# Patient Record
Sex: Female | Born: 1985 | Race: White | Hispanic: No | Marital: Married | Smoking: Never smoker
Health system: Southern US, Community
[De-identification: ages and names within clinical notes are randomized; demographics above are authoritative.]

## PROBLEM LIST (undated history)

## (undated) DIAGNOSIS — J45909 Unspecified asthma, uncomplicated: Secondary | ICD-10-CM

## (undated) DIAGNOSIS — Z87898 Personal history of other specified conditions: Secondary | ICD-10-CM

## (undated) DIAGNOSIS — O24419 Gestational diabetes mellitus in pregnancy, unspecified control: Secondary | ICD-10-CM

## (undated) DIAGNOSIS — F32A Depression, unspecified: Secondary | ICD-10-CM

## (undated) DIAGNOSIS — Z8669 Personal history of other diseases of the nervous system and sense organs: Secondary | ICD-10-CM

## (undated) DIAGNOSIS — F329 Major depressive disorder, single episode, unspecified: Secondary | ICD-10-CM

## (undated) DIAGNOSIS — G43909 Migraine, unspecified, not intractable, without status migrainosus: Secondary | ICD-10-CM

## (undated) HISTORY — PX: NO PAST SURGERIES: SHX2092

## (undated) HISTORY — DX: Personal history of other diseases of the nervous system and sense organs: Z86.69

## (undated) HISTORY — DX: Depression, unspecified: F32.A

## (undated) HISTORY — PX: WISDOM TOOTH EXTRACTION: SHX21

## (undated) HISTORY — DX: Gestational diabetes mellitus in pregnancy, unspecified control: O24.419

## (undated) HISTORY — DX: Major depressive disorder, single episode, unspecified: F32.9

## (undated) HISTORY — DX: Personal history of other specified conditions: Z87.898

## (undated) HISTORY — DX: Migraine, unspecified, not intractable, without status migrainosus: G43.909

## (undated) HISTORY — DX: Unspecified asthma, uncomplicated: J45.909

---

## 2012-09-26 DIAGNOSIS — G43909 Migraine, unspecified, not intractable, without status migrainosus: Secondary | ICD-10-CM | POA: Insufficient documentation

## 2012-09-26 DIAGNOSIS — Q0701 Arnold-Chiari syndrome with spina bifida: Secondary | ICD-10-CM | POA: Insufficient documentation

## 2012-09-26 DIAGNOSIS — R55 Syncope and collapse: Secondary | ICD-10-CM | POA: Insufficient documentation

## 2015-07-28 ENCOUNTER — Ambulatory Visit
Admission: RE | Admit: 2015-07-28 | Discharge: 2015-07-28 | Disposition: A | Discharge status: Home / Self Care | Payer: BLUE CROSS/BLUE SHIELD | Source: Ambulatory Visit | Attending: Nurse Practitioner | Admitting: Nurse Practitioner

## 2015-07-28 ENCOUNTER — Other Ambulatory Visit: Payer: Self-pay | Admitting: Nurse Practitioner

## 2015-07-28 ENCOUNTER — Ambulatory Visit
Admission: RE | Admit: 2015-07-28 | Discharge: 2015-07-28 | Disposition: A | Discharge status: Home / Self Care | Payer: BLUE CROSS/BLUE SHIELD | Source: Ambulatory Visit | Attending: Preventative Medicine | Admitting: Preventative Medicine

## 2015-07-28 DIAGNOSIS — M25471 Effusion, right ankle: Secondary | ICD-10-CM

## 2015-07-28 DIAGNOSIS — M25473 Effusion, unspecified ankle: Secondary | ICD-10-CM | POA: Insufficient documentation

## 2015-08-19 DIAGNOSIS — G935 Compression of brain: Secondary | ICD-10-CM | POA: Diagnosis not present

## 2015-08-19 DIAGNOSIS — Z79899 Other long term (current) drug therapy: Secondary | ICD-10-CM | POA: Diagnosis not present

## 2015-08-19 DIAGNOSIS — Z6833 Body mass index (BMI) 33.0-33.9, adult: Secondary | ICD-10-CM | POA: Diagnosis not present

## 2015-08-19 DIAGNOSIS — R55 Syncope and collapse: Secondary | ICD-10-CM | POA: Diagnosis not present

## 2015-08-19 DIAGNOSIS — G43909 Migraine, unspecified, not intractable, without status migrainosus: Secondary | ICD-10-CM | POA: Diagnosis not present

## 2015-08-19 DIAGNOSIS — G43009 Migraine without aura, not intractable, without status migrainosus: Secondary | ICD-10-CM | POA: Diagnosis not present

## 2015-08-20 DIAGNOSIS — M25571 Pain in right ankle and joints of right foot: Secondary | ICD-10-CM | POA: Diagnosis not present

## 2015-08-27 DIAGNOSIS — M25571 Pain in right ankle and joints of right foot: Secondary | ICD-10-CM | POA: Diagnosis not present

## 2015-09-17 ENCOUNTER — Encounter: Payer: Self-pay | Admitting: Physical Therapy

## 2015-09-17 ENCOUNTER — Ambulatory Visit: Payer: BLUE CROSS/BLUE SHIELD | Attending: Nurse Practitioner | Admitting: Physical Therapy

## 2015-09-17 DIAGNOSIS — M25671 Stiffness of right ankle, not elsewhere classified: Secondary | ICD-10-CM | POA: Insufficient documentation

## 2015-09-17 DIAGNOSIS — M6281 Muscle weakness (generalized): Secondary | ICD-10-CM | POA: Insufficient documentation

## 2015-09-17 DIAGNOSIS — M25571 Pain in right ankle and joints of right foot: Secondary | ICD-10-CM

## 2015-09-17 NOTE — Therapy (Signed)
[facility] [address] [place], [address] Phone: [phone]   Fax:  [phone]  Physical Therapy Evaluation  Patient Details  Name: Angela Mccormick MRN: [id number] Date of Birth: [birth year] Referring Provider: Martie Round AGNP  Encounter Date: 09/17/2015      PT End of Session - 09/17/15 1223    Visit Number 1   Number of Visits 12   PT Start Time 0952   PT Stop Time 1045   PT Time Calculation (min) 53 min   Activity Tolerance Patient tolerated treatment well   Behavior During Therapy [facility] [place] for tasks assessed/performed      History reviewed. No pertinent past medical history.  History reviewed. No pertinent past surgical history.  There were no vitals filed for this visit.       Subjective Assessment - 09/17/15 1014    Subjective Pt states she's experiencing R lateral ankle pain, weakness and edema. Reports ankle instability resulting in a fall once each month. Pt states increase pain and difficulty with ascending/descending ladders, standing, ascending/descending stairs and ambulating. Decreased pain with medication(naproxin), sitting, and Ice. Worse at night before falling asleep. The best the pain gets 0/10 and the worst the pain gets is 6/10.   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   Currently in Pain? No/denies   Pain Score 0-No pain   Pain Location Ankle   Pain Orientation Right   Pain Descriptors / Indicators Aching;Stabbing   Pain Type Chronic pain            [facility] PT Assessment - 09/17/15 1008    Assessment   Medical Diagnosis Ankle Joint Pain, Right (ICD-719.47); Ankle Swelling  (ICD-729.81)   Referring Provider Martie Round AGNP   Onset Date/Surgical Date 07/14/15   Hand Dominance Right   Next MD Visit unknown   Prior Therapy ten years ago   Precautions   Precautions None   Restrictions   Weight Bearing Restrictions No   Balance Screen   Has the patient fallen in the past 6 months Yes   How many times? 1   Has the patient had a decrease in activity level because of a fear of falling?  No   Is the patient reluctant to leave their home because of a fear of falling?  No   Home [employer] information centre manager residence   Living Arrangements Spouse/significant other   Available Help at Discharge Family   Type of Home House   Home Access Ramped entrance   Home Layout One level   Prior Function   Level of Independence Independent   Vocation Part time employment   Vocation Requirements Walking, lifting, standing, ascending ladders   Leisure Ride 4 wheelers,    Cognition   Overall Cognitive Status Within Functional Limits for tasks assessed       Objective: Observation: Gait: Decreased push off on R with increase knee flexion, increased bilateral hip external rotation and increased ankle pronation. Increased pocket swelling distal to lateral malleolus on the right side.  Palpation: Increased muscle spasms and guarding along peroneal musculature. Tenderness to palpation along lateral ankle ligaments and plantar aspect of the foot.   Measurement:  R Ankle AROM/MMT: Dorsiflexion: 0, 4-/5,  Plantarflexion: 65, Inversion: 45, 4-/5, Eversion:10, 4-/5 R Hip AROM/MMT: Extension:WNL, 3+/5, ABD: WNL 4-/5,  ER:WNL 4-/5, L Hip AROM/MMT:  Extension:WNL; 4-/5, ABD:WNL 4-/5 ,ER:WNL 4-/5, Talocrural anterior-posterior bilaterally: WNL bilaterally  Single leg Balance: R: 7 secs, L: 30sec  Special Testing: R: (-): Talar tilt test  Outcome Measures:  Foot/Ankle disability Index: 22% LEFS: 59/80  Therapeutic Exercise: Patient performed exercises with  guidance, verbal and tactile cues and demonstration of therapist: Clamshells in sidelying B -- x15 Hip extension in prone-- x15 Foot intrinsics  (dorsiflexion/ toe flexion- plantarflexion/toe extension) -- x15 AROM ankle inversion/eversion with towel -- x15   Patient response to treatment: No aggravation of symptoms during or after treatment. Increased instability with single leg stance requiring UE support indicating decreased proprioception/motor control. Demonstrated good technique with exercises following instruction and with verbal cuing          PT Education - 09/17/15 1220    Education provided Yes   Education Details HEP: Ankle AROM (inversion/eversion), foot intrinsics, hip extension, clamshells    Person(s) Educated Patient   Methods Explanation;Demonstration   Comprehension Verbalized understanding;Returned demonstration             PT Long Term Goals - 09/17/15 1239    PT LONG TERM GOAL #1   Title Pt will demonstrate a score of 69/80 on her LEFS score by 10/29/15 to demonstrate significant improvement in self percieved lower extremity function and improved ability to perform occupational tasks such as walking.   Baseline LEFS: 59/80   Status New   PT LONG TERM GOAL #2   Title Pt will demonstrate a score of <12% on her foot/ankle disability index by 10/29/15 to demonstrate significant improvement in self percieved ankle function and improved ability to perform occupational tasks such as climbing ladders   Baseline Foot/ankle disability index: 22%   Status New   PT LONG TERM GOAL #3   Title Pt will be independent with exercise performance and progression aimed at improving ankle propioception, muscular endurance and motor control by 10/29/15 to continue improvement of symptoms after discharge from therapy   Baseline Dependent with exercise progression and performance requiring frequent cueing to perform.    Status New               Plan - 09/17/15 1231     Clinical Impression Statement Patient is a 30yo right hand dominant female experiencing R ankle instability and pain. Patient demonstrates increased ankle dysfunction demonstrated by her decreased scores on the  Foot/ankle disability index 22% (moderate self-perceived ankle dysfunction) and LEFS: 59/80 (Moderate lower extremity self-perceived function). Patient reports increased difficulty with occupational tasks such as ascending/descending ladders, and ambulating, Patient also demonstrates decreased single leg balance and ankle/hip strength indicating impaired lower extremity motor control and muscular endurance/strength and patinet will benefit from further skilled therapy to return to prior level of function.     Rehab Potential Good   Clinical Impairments Affecting Rehab Potential (-) Arnold-chiari malformation, job duties (+) Age, family support.   PT Frequency 2x / week   PT Duration 6 weeks   PT Treatment/Interventions Iontophoresis 4mg /ml Dexamethasone;Moist Heat;Therapeutic exercise;Therapeutic activities;Manual techniques;Electrical Stimulation;Cryotherapy;Patient/family education;Neuromuscular re-education;Ultrasound;Aquatic Therapy;Balance training   PT Next Visit Plan  heel raises, bridges, ankle strengthening with bands.   PT Home Exercise Plan  foot instrinsics strengthening, clamshells, hip extension, AROM ankle inversion/eversion   Consulted and Agree with Plan of Care Patient      Patient will benefit from skilled therapeutic intervention in order to  improve the following deficits and impairments:  Decreased balance, Decreased mobility, Decreased strength, Increased edema, Hypermobility, Pain, Decreased endurance, Decreased coordination, Decreased range of motion  Visit Diagnosis: Pain in right ankle and joints of right foot  Stiffness of right ankle, not elsewhere classified  Muscle weakness (generalized)     Problem List There are no active problems to display for this  patient.   Myrene GalasWesley Kiaja Shorty, SPT 09/17/2015, 12:45 PM  [facility] [address] [place], [place], [address] Phone: [phone]   Fax:  [phone]  Name: Cannon KettleBrittany M Brod MRN: [id number] Date of Birth: 08-19-1985

## 2015-09-20 ENCOUNTER — Encounter: Payer: Self-pay | Admitting: Physical Therapy

## 2015-09-20 ENCOUNTER — Ambulatory Visit: Payer: BLUE CROSS/BLUE SHIELD | Admitting: Physical Therapy

## 2015-09-20 DIAGNOSIS — M25571 Pain in right ankle and joints of right foot: Secondary | ICD-10-CM | POA: Diagnosis not present

## 2015-09-20 DIAGNOSIS — M6281 Muscle weakness (generalized): Secondary | ICD-10-CM

## 2015-09-20 DIAGNOSIS — M25671 Stiffness of right ankle, not elsewhere classified: Secondary | ICD-10-CM

## 2015-09-20 NOTE — Therapy (Signed)
[facility] [address], [place], [address] Phone: [phone]   Fax:  [phone]  Physical Therapy Treatment  Patient Details  Name: Angela Mccormick MRN: [id number] Date of Birth: 12/19/1985 Referring Provider: Martie Round AGNP  Encounter Date: 09/20/2015      PT End of Session - 09/20/15 1913    Visit Number 2   Number of Visits 12   Date for PT Re-Evaluation 10/29/15   PT Start Time 1530   PT Stop Time 1614   PT Time Calculation (min) 44 min   Activity Tolerance Patient tolerated treatment well   Behavior During Therapy [facility] for tasks assessed/performed      History reviewed. No pertinent past medical history.  History reviewed. No pertinent past surgical history.  There were no vitals filed for this visit.      Subjective Assessment - 09/20/15 1535    Subjective Pt states no increased pain today however mentions she had one instance of the ankle "popping" while standing at work yesterday which caused mild pain in the ankle.    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   Currently in Pain? No/denies       Objective: Observation: Gait: Decreased push off on R with increase knee flexion, increased bilateral hip external rotation and increased ankle pronation. Increased pocket swelling distal to lateral malleolus on the right side. Foot fasiculations noted throughout entirety of treatment session.  Palpation: Increased muscle spasms and guarding along peroneal musculature. Tenderness to palpation along lateral ankle ligaments and plantar aspect of the foot -- decreased tenderness today   Therapeutic Exercise: Patient performed exercises  with guidance, verbal and tactile cues and demonstration of therapist:  Foot intrinsics (dorsiflexion/ toe flexion- plantarflexion/toe extension) -- x15 Resisted dorsiflexion with red band -- x 20 Resisted Eversion/Inversion with red band -- x 20 Side stepping onto Airex band -- x 20 Tandem stepping on airex beam -- x 10 (up/down) SLS on airex pad -- 3 x 10 sec with UE support BAPS board -- forward/back, side/side (2x15)  Manual Therapy: Soft tissue mobilization to superior/distal aspect of the peroneal muscular and the plantar aspect of the foot with the patient positioned in long sitting to decrease muscle spasms.    Patient response to treatment: No aggravation of symptoms during or after treatment. Increased instability with single leg stance requiring UE support indicating decreased proprioception/motor control. Demonstrated good technique with sitting band exercises today requiring tactile cueing to perform with decreased hip movement.           PT Education - 09/20/15 0912    Education provided Yes   Education Details HEP: Ankle inversion/eversion/ dorsiflexion with the band.   Person(s) Educated Patient   Methods Explanation;Demonstration   Comprehension Verbalized understanding;Returned demonstration             PT Long Term Goals - 09/17/15 1239    PT LONG TERM GOAL #1   Title Pt will demonstrate a score of 69/80 on her LEFS score by 10/29/15 to demonstrate significant improvement in self percieved lower extremity function and improved ability to perform occupational tasks such as walking.   Baseline LEFS: 59/80   Status New   PT LONG TERM GOAL #2   Title  Pt will demonstrate a score of <12% on her foot/ankle disability index by 10/29/15 to demonstrate significant improvement in self percieved ankle function and improved ability to perform occupational tasks such as climbing ladders   Baseline Foot/ankle disability index: 22%   Status New   PT LONG TERM GOAL #3    Title Pt will be independent with exercise performance and progression aimed at improving ankle propioception, muscular endurance and motor control by 10/29/15 to continue improvement of symptoms after discharge from therapy   Baseline Dependent with exercise progression and performance requiring frequent cueing to perform.    Status New               Plan - 09/20/15 1922    Clinical Impression Statement Patient making progress towards long term goals with ability to tolerate increase in exercise progression without aggravation symptoms. Patient demonstrates increased ankle strategies when performing standing balance exercise indicating decreased propioception and will benefit from further skilled therapy to return to prior level of function.    Rehab Potential Good   Clinical Impairments Affecting Rehab Potential (-) Arnold-chiari malformation, job duties (+) Age, family support.   PT Frequency 2x / week   PT Duration 6 weeks   PT Treatment/Interventions Iontophoresis 4mg /ml Dexamethasone;Moist Heat;Therapeutic exercise;Therapeutic activities;Manual techniques;Electrical Stimulation;Cryotherapy;Patient/family education;Neuromuscular re-education;Ultrasound;Aquatic Therapy;Balance training   PT Next Visit Plan  heel raises, bridges, ankle strengthening with bands.   PT Home Exercise Plan  foot instrinsics strengthening, clamshells, hip extension, AROM ankle inversion/eversion   Consulted and Agree with Plan of Care Patient      Patient will benefit from skilled therapeutic intervention in order to improve the following deficits and impairments:  Decreased balance, Decreased mobility, Decreased strength, Increased edema, Hypermobility, Pain, Decreased endurance, Decreased coordination, Decreased range of motion  Visit Diagnosis: Pain in right ankle and joints of right foot  Muscle weakness (generalized)  Stiffness of right ankle, not elsewhere classified     Problem List There  are no active problems to display for this patient.   Myrene GalasWesley Abimelec Grochowski, SPT 09/21/2015, 9:25 AM  [facility] [address] [place], [place], [address] Phone: [phone]   Fax:  [phone]  Name: Angela Mccormick MRN: [id number] Date of Birth: [birth year]

## 2015-09-23 ENCOUNTER — Encounter: Payer: BLUE CROSS/BLUE SHIELD | Admitting: Physical Therapy

## 2015-09-24 ENCOUNTER — Encounter: Payer: Self-pay | Admitting: Physical Therapy

## 2015-09-24 ENCOUNTER — Ambulatory Visit: Payer: BLUE CROSS/BLUE SHIELD | Admitting: Physical Therapy

## 2015-09-24 DIAGNOSIS — M25671 Stiffness of right ankle, not elsewhere classified: Secondary | ICD-10-CM | POA: Diagnosis not present

## 2015-09-24 DIAGNOSIS — M25571 Pain in right ankle and joints of right foot: Secondary | ICD-10-CM | POA: Diagnosis not present

## 2015-09-24 DIAGNOSIS — M6281 Muscle weakness (generalized): Secondary | ICD-10-CM

## 2015-09-24 NOTE — Therapy (Signed)
[facility] [address] [place], [place], [address] Phone: [phone]   Fax:  [phone]  Physical Therapy Treatment  Patient Details  Name: Angela Mccormick MRN: [id number] Date of Birth: [birth year] Referring Provider: Martie RoundNicole Spencer AGNP  Encounter Date: 09/24/2015      PT End of Session - 09/24/15 1001    Visit Number 3   Number of Visits 12   Date for PT Re-Evaluation 10/29/15   PT Start Time 0908   PT Stop Time 0950   PT Time Calculation (min) 42 min   Activity Tolerance Patient tolerated treatment well   Behavior During Therapy [facility] for tasks assessed/performed      History reviewed. No pertinent past medical history.  History reviewed. No pertinent past surgical history.  There were no vitals filed for this visit.      Subjective Assessment - 09/24/15 0911    Subjective Pt states no episodes of ankle instability over the past few days. Experienced one episode of increased ankle pain since the previous visit after work.    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   Currently in Pain? No/denies      Objective: Observation: Gait: Decreased push off on R with increase knee flexion and increased ankle pronation. Decreased pocket swelling over distal to lateral malleolus on the right side--improved today. Foot fasiculations noted throughout entirety of treatment session.   Palpation: Increased muscle spasms and guarding along peroneal musculature (most noteably over distal tendon attachment today).  Therapeutic Exercise: Patient performed exercises with guidance, verbal and tactile cues and demonstration of therapist:  Resisted dorsiflexion with  red band -- x 20 Resisted Eversion/Inversion with red band -- x 20 Marching on airex - x10 Single leg tance ball toss at pitch back (with one leg supported on dynadisc) --  x 15 bilaterally Monster walk side stepping with green band around knees -- 7130ft x 2 both directions Tandem stepping on airex beam -- x 10 (up/down with airex pad placed underneath) SLS on airex pad -- 3 x 10 sec with UE support BAPS board -- forward/back, side/side, rotations clockwise --x15 Stepping up onto airex pad with high knee -- x 15  Manual Therapy: Soft tissue mobilization to superior/distal aspect of the peroneal muscular and tibialis anterior  with the patient positioned in long sitting to decrease muscle spasms.    Patient response to treatment: No aggravation of symptoms during or after treatment. Increased ankle strategies during dynamic balance exercises requiring UE support to perform indicating decreased proprioception/motor control. Demonstrated good technique with monster walk exercises requiring minimal verbal cueing for proper muscle activation.          PT Education - 09/24/15 1001    Education provided Yes   Education Details HEP: Step ups with with high knee    Person(s) Educated Patient   Methods Demonstration;Explanation   Comprehension Verbalized understanding;Returned demonstration             PT Long Term Goals - 09/17/15 1239    PT LONG TERM GOAL #1   Title Pt will demonstrate a score of 69/80 on her LEFS score by 10/29/15 to demonstrate significant improvement in self percieved lower extremity function and improved ability to perform occupational tasks such  as walking.   Baseline LEFS: 59/80   Status New   PT LONG TERM GOAL #2   Title Pt will demonstrate a score of <12% on her foot/ankle disability index by 10/29/15 to demonstrate significant improvement in self percieved ankle function and improved ability to perform occupational tasks such as climbing ladders   Baseline  Foot/ankle disability index: 22%   Status New   PT LONG TERM GOAL #3   Title Pt will be independent with exercise performance and progression aimed at improving ankle propioception, muscular endurance and motor control by 10/29/15 to continue improvement of symptoms after discharge from therapy   Baseline Dependent with exercise progression and performance requiring frequent cueing to perform.    Status New               Plan - 09/24/15 1030    Clinical Impression Statement Patient making progress towards long term goals and tolerated increase in exercise progression without aggravation of symptoms. No episodes of ankle instability since previous visit indicates functional carryover between visits. Patient required less UE support to perform static balance exercise today compared to previous visits and will benefit from further skilled therapy to return to prior level of function.    Rehab Potential Good   Clinical Impairments Affecting Rehab Potential (-) Arnold-chiari malformation, job duties (+) Age, family support.   PT Frequency 2x / week   PT Duration 6 weeks   PT Treatment/Interventions Iontophoresis /ml Dexamethasone;Moist Heat;Therapeutic exercise;Therapeutic activities;Manual techniques;Electrical Stimulation;Cryotherapy;Patient/family education;Neuromuscular re-education;Ultrasound;Aquatic Therapy;Balance training   PT Next Visit Plan Progress standing ankle stabilization exercises   PT Home Exercise Plan  foot instrinsics strengthening, clamshells, hip extension, AROM ankle inversion/eversion   Consulted and Agree with Plan of Care Patient      Patient will benefit from skilled therapeutic intervention in order to improve the following deficits and impairments:  Decreased balance, Decreased mobility, Decreased strength, Increased edema, Hypermobility, Pain, Decreased endurance, Decreased coordination, Decreased range of motion  Visit Diagnosis: Pain in right ankle and  joints of right foot  Muscle weakness (generalized)  Stiffness of right ankle, not elsewhere classified     Problem List There are no active problems to display for this patient.   Myrene Galas, SPT 09/24/2015, 12:30 PM  [facility] PHYSICAL AND SPORTS MEDICINE [address] [place], [address] Phone: [phone]   Fax:  [phone]  Name: Angela Mccormick MRN: [id number] Date of Birth: 07-05-1985

## 2015-09-28 ENCOUNTER — Ambulatory Visit: Payer: BLUE CROSS/BLUE SHIELD | Admitting: Physical Therapy

## 2015-09-30 ENCOUNTER — Encounter: Payer: BLUE CROSS/BLUE SHIELD | Admitting: Physical Therapy

## 2015-10-01 ENCOUNTER — Ambulatory Visit: Payer: BLUE CROSS/BLUE SHIELD | Admitting: Physical Therapy

## 2015-10-01 ENCOUNTER — Encounter: Payer: Self-pay | Admitting: Physical Therapy

## 2015-10-01 DIAGNOSIS — M25571 Pain in right ankle and joints of right foot: Secondary | ICD-10-CM

## 2015-10-01 DIAGNOSIS — M6281 Muscle weakness (generalized): Secondary | ICD-10-CM | POA: Diagnosis not present

## 2015-10-01 DIAGNOSIS — M25671 Stiffness of right ankle, not elsewhere classified: Secondary | ICD-10-CM | POA: Diagnosis not present

## 2015-10-01 NOTE — Therapy (Signed)
[place] [facility] PH[facility]ICINE [address] [place], [place], [address] Phone: [phone]   Fax:  [phone]  Physical Therapy Treatment  Patient Details  Name: Angela KettleBrittany M Archila MRN: [id number] Date of Birth: 07-Jun-1985 Referring Provider: Martie RoundNicole Spencer AGNP  Encounter Date: 10/01/2015      PT End of Session - 10/01/15 1125    Visit Number 4   Number of Visits 12   Date for PT Re-Evaluation 10/29/15   PT Start Time 1031   PT Stop Time 1115   PT Time Calculation (min) 44 min   Activity Tolerance Patient tolerated treatment well   Behavior During Therapy [facility] for tasks assessed/performed      History reviewed. No pertinent past medical history.  History reviewed. No pertinent past surgical history.  There were no vitals filed for this visit.      Subjective Assessment - 10/01/15 1033    Subjective Pt states no episodes of ankle instability over the past week. States no current pain in the ankle.    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   Currently in Pain? No/denies      Objective: Observation: Gait: Minor decrease in push off on R and increased ankle pronation -- improved today.  Foot fasiculations noted throughout entire treatment.  Palpation: Increased muscle spasms and guarding along peroneal musculature (most noteably over distal tendon attachment today).  Therapeutic Exercise: Patient performed exercises with guidance, verbal and tactile cues and demonstration of therapist:  Resisted dorsiflexion with green band -- x 20 Resisted Eversion/Inversion with green band -- x 20 BAPS board -- forward/back, side/side, rotations clockwise --x15 lvl:3  (sitting) Single leg stance ball toss at pitch back (with one leg supported on dynadisc) -- x 15 bilaterally Single leg forward/backward, laterally on wobbleboard -- x15 bilaterally  Marching on airex - x10 Tandem stepping on airex beam -- x 10 (up/down with airex pad placed underneath) SLS on airex pad -- 3 x 15 sec with UE support Lateral walk outs against grey band -- x6 bilaterally   Patient response to treatment: No aggravation of symptoms during or after treatment. Increased ankle strategies when performing single leg stance on airex pad and required UE support to perform.           PT Education - 10/01/15 1126    Education provided Yes   Education Details HEP: Marches on airex pad, Single leg stance on airex   Person(s) Educated Patient   Methods Explanation;Demonstration   Comprehension Verbalized understanding;Returned demonstration             PT Long Term Goals - 09/17/15 1239    PT LONG TERM GOAL #1   Title Pt will demonstrate a score of 69/80 on her LEFS score by 10/29/15 to demonstrate significant improvement in self percieved lower extremity function and improved ability to perform occupational tasks such as walking.   Baseline LEFS: 59/80   Status New   PT LONG TERM GOAL #2   Title Pt will demonstrate a score of <12% on her foot/ankle disability index by 10/29/15 to demonstrate significant improvement in self percieved ankle function and improved ability to perform occupational tasks such as climbing ladders   Baseline Foot/ankle disability index: 22%   Status New  PT LONG TERM GOAL #3   Title Pt will be independent with exercise performance and progression aimed at improving ankle propioception, muscular endurance and motor control by 10/29/15 to continue improvement of symptoms after discharge from therapy   Baseline Dependent with exercise progression and performance requiring frequent cueing to perform.    Status New               Plan -  10/01/15 1122    Clinical Impression Statement Improved demonstration of dynamic/static balance with exercises indicating functional carryover between visits. Increased symptoms with single leg posterior weight shifts on the wobble board and required cueing to perform throughout decreased ROM to decrease pain indicating decreased muscular coordination. Patient will benefit from further skilled therapy aimed at improving muscular coordination/endurance to imporve functional occupational tasks.   Rehab Potential Good   Clinical Impairments Affecting Rehab Potential (-) Arnold-chiari malformation, job duties (+) Age, family support.   PT Frequency 2x / week   PT Duration 6 weeks   PT Treatment/Interventions Iontophoresis /ml Dexamethasone;Moist Heat;Therapeutic exercise;Therapeutic activities;Manual techniques;Electrical Stimulation;Cryotherapy;Patient/family education;Neuromuscular re-education;Ultrasound;Aquatic Therapy;Balance training   PT Next Visit Plan Progress standing ankle stabilization exercises   PT Home Exercise Plan  foot instrinsics strengthening, clamshells, hip extension, AROM ankle inversion/eversion   Consulted and Agree with Plan of Care Patient      Patient will benefit from skilled therapeutic intervention in order to improve the following deficits and impairments:  Decreased balance, Decreased mobility, Decreased strength, Increased edema, Hypermobility, Pain, Decreased endurance, Decreased coordination, Decreased range of motion  Visit Diagnosis: Pain in right ankle and joints of right foot  Muscle weakness (generalized)  Stiffness of right ankle, not elsewhere classified     Problem List There are no active problems to display for this patient.   Myrene Galas, SPT 10/01/2015, 11:29 AM  [place] [facility] PHYSICAL AND SPORTS MEDICINE [address], [place], [address] Phone: [phone]   Fax:  [phone]  Name:  PRANATHI WINFREE MRN: [id number] Date of Birth: 14-Feb-1986

## 2015-10-05 ENCOUNTER — Ambulatory Visit: Payer: BLUE CROSS/BLUE SHIELD | Admitting: Physical Therapy

## 2015-10-05 ENCOUNTER — Encounter: Payer: Self-pay | Admitting: Physical Therapy

## 2015-10-05 DIAGNOSIS — M25571 Pain in right ankle and joints of right foot: Secondary | ICD-10-CM

## 2015-10-05 DIAGNOSIS — M6281 Muscle weakness (generalized): Secondary | ICD-10-CM | POA: Diagnosis not present

## 2015-10-05 DIAGNOSIS — M25671 Stiffness of right ankle, not elsewhere classified: Secondary | ICD-10-CM | POA: Diagnosis not present

## 2015-10-05 NOTE — Therapy (Signed)
[place] [facility] [address] , [place], [address] Phone: [phone]   Fax:  [phone]  Physical Therapy Treatment  Patient Details  Name: Angela Mccormick MRN: [id number] Date of Birth: [birth year] Referring Provider: Martie RoundNicole Spencer AGNP  Encounter Date: 10/05/2015      PT End of Session - 10/05/15 1706    Visit Number 5   Number of Visits 12   Date for PT Re-Evaluation 10/29/15   PT Start Time 1616   PT Stop Time 1657   PT Time Calculation (min) 41 min   Activity Tolerance Patient tolerated treatment well   Behavior During Therapy [facility] for tasks assessed/performed      History reviewed. No pertinent past medical history.  History reviewed. No pertinent past surgical history.  There were no vitals filed for this visit.      Subjective Assessment - 10/05/15 1617    Subjective Pt states the ankle has been feeling better stating no episodes of instability since the previous appointment. Reports minimal soreness over the ankle on Saturday which resolved the following day.    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   Currently in Pain? No/denies      Objective: Observation: Gait: Improved push off on R foot. Foot fasiculations noted throughout entire treatment.  Palpation: Increased muscle spasms and guarding along peroneal musculature (most noteably over distal tendon attachment).  Therapeutic Exercise: Patient performed exercises with guidance, verbal and tactile cues and demonstration of therapist:  Resisted dorsiflexion with green band -- x 20 Resisted Eversion/Inversion with green band -- x 20 BAPS board -- forward/back, side/side,  rotations clockwise --x20 lvl:3 (sitting) Single leg stance ball toss at pitch back (with one leg supported on dynadisc) -- x 15 bilaterally Ball kicks on airex pad -- x15 Tandem rotations on airex pad -- x10  Forward/backward, laterally on wobbleboard -- x15 bilaterally  Marching on Bosu- x10 SLS on airex pad -- 5 x 10 sec with UE support (stopped due to increased fatigue)   Patient response to treatment: Slight increase in symptoms after performing single leg stance on the airex pad which is improved after 20 seconds of standing rest break. Good demonstration of soccer kicks requiring UE support to perform with proper technique and muscular activation.           PT Education - 10/05/15 1706    Education provided Yes   Education Details HEP: soccer kicks on the airex pad   Person(s) Educated Patient   Methods Explanation;Demonstration   Comprehension Verbalized understanding;Returned demonstration             PT Long Term Goals - 09/17/15 1239    PT LONG TERM GOAL #1   Title Pt will demonstrate a score of 69/80 on her LEFS score by 10/29/15 to demonstrate significant improvement in self percieved lower extremity function and improved ability to perform occupational tasks such as walking.   Baseline LEFS: 59/80   Status New   PT LONG TERM GOAL #2   Title Pt will demonstrate a score of <12% on her foot/ankle disability index by 10/29/15 to demonstrate significant improvement in self percieved ankle function and improved ability to perform occupational tasks such as climbing ladders   Baseline Foot/ankle disability  index: 22%   Status New   PT LONG TERM GOAL #3   Title Pt will be independent with exercise performance and progression aimed at improving ankle propioception, muscular endurance and motor control by 10/29/15 to continue improvement of symptoms after discharge from therapy   Baseline Dependent with exercise progression and performance requiring frequent cueing to  perform.    Status New               Plan - 10/05/15 1707    Clinical Impression Statement Improved ability to perform BAPS board exercises today indicating improvement in muscular coordination. Increased symptoms and ankle strategies when performing single leg balance at the airex pad indicating decreased muscular endurance/proprioception and patient will benefit from further skilled therapy to return to prior level of function.    Rehab Potential Good   Clinical Impairments Affecting Rehab Potential (-) Arnold-chiari malformation, job duties (+) Age, family support.   PT Frequency 2x / week   PT Duration 6 weeks   PT Treatment/Interventions Iontophoresis /ml Dexamethasone;Moist Heat;Therapeutic exercise;Therapeutic activities;Manual techniques;Electrical Stimulation;Cryotherapy;Patient/family education;Neuromuscular re-education;Ultrasound;Aquatic Therapy;Balance training   PT Next Visit Plan Progress standing ankle stabilization exercises   PT Home Exercise Plan  foot instrinsics strengthening, clamshells, hip extension, AROM ankle inversion/eversion   Consulted and Agree with Plan of Care Patient      Patient will benefit from skilled therapeutic intervention in order to improve the following deficits and impairments:  Decreased balance, Decreased mobility, Decreased strength, Increased edema, Hypermobility, Pain, Decreased endurance, Decreased coordination, Decreased range of motion  Visit Diagnosis: Pain in right ankle and joints of right foot  Muscle weakness (generalized)  Stiffness of right ankle, not elsewhere classified     Problem List There are no active problems to display for this patient.   Myrene Galas, SPT 10/05/2015, 5:30 PM  [place] [facility] PHYSICAL AND SPORTS MEDICINE [address], [place], [address] Phone: [phone]   Fax:  [phone]  Name: Angela Mccormick MRN: [id number] Date of Birth:  01-12-1986

## 2015-10-07 ENCOUNTER — Encounter: Payer: BLUE CROSS/BLUE SHIELD | Admitting: Physical Therapy

## 2015-10-08 ENCOUNTER — Encounter: Payer: Self-pay | Admitting: Physical Therapy

## 2015-10-08 ENCOUNTER — Ambulatory Visit: Payer: BLUE CROSS/BLUE SHIELD | Admitting: Physical Therapy

## 2015-10-08 DIAGNOSIS — M6281 Muscle weakness (generalized): Secondary | ICD-10-CM

## 2015-10-08 DIAGNOSIS — M25571 Pain in right ankle and joints of right foot: Secondary | ICD-10-CM | POA: Diagnosis not present

## 2015-10-08 DIAGNOSIS — M25671 Stiffness of right ankle, not elsewhere classified: Secondary | ICD-10-CM | POA: Diagnosis not present

## 2015-10-08 NOTE — Therapy (Signed)
[place] [facility]ORTS MEDICINE [address], [place], [address] Phone: [phone]   Fax:  [phone]  Physical Therapy Treatment  Patient Details  Name: Angela Mccormick MRN: [id number] Date of Birth: June 25, 1985 Referring Provider: Martie Round AGNP  Encounter Date: 10/08/2015      PT End of Session - 10/08/15 1044    Visit Number 6   Number of Visits 12   Date for PT Re-Evaluation 10/29/15   PT Start Time 0855   PT Stop Time 0925   PT Time Calculation (min) 30 min   Activity Tolerance Patient tolerated treatment well   Behavior During Therapy [facility] for tasks assessed/performed      History reviewed. No pertinent past medical history.  History reviewed. No pertinent past surgical history.  There were no vitals filed for this visit.      Subjective Assessment - 10/08/15 0917    Subjective Patient states her ankle is feeling much beter and states no instance of instability since the previous visit.   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   Currently in Pain? No/denies         Objective: Observation: Gait: Improved push off on R foot. Foot fasiculations noted throughout entire treatment.  Palpation: Increased muscle spasms and guarding along peroneal musculature (most noteably over distal tendon attachment).  Therapeutic Exercise: Patient performed exercises with guidance, verbal and tactile cues and demonstration of therapist:   BAPS board -- forward/back, side/side, rotations clockwise --x20 lvl:3 (sitting) Single leg stance ball toss at pitch back (with one leg supported on dynadisc) -- x 15 bilaterally Ball kicks on airex pad -- x15  bilaterally Tandem rotations with 4# ball  -- x15(with arms extended into shoulder flexion@ 90 degrees) Heel/toe raises on airex pad -- x 15 Monster walks with green resistance band -- 20 x 2 both directions.  SLS on airex pad -- 3 x 20 sec with UE support (stopped due to increased fatigue) Heel taps off of 4" step -- x 10 bilaterally   Patient response to treatment: No aggravation of symptoms noted during or after treatment session. Good demonstration of heel/toe raises on airex pad with patient requiring minimal cueing to perform exercise with proper lumbar/hip positioning.          PT Education - 10/08/15 1045    Education provided Yes   Education Details HEP: monster walks with green band   Person(s) Educated Patient   Methods Explanation;Demonstration   Comprehension Verbalized understanding;Returned demonstration             PT Long Term Goals - 09/17/15 1239    PT LONG TERM GOAL #1   Title Pt will demonstrate a score of 69/80 on her LEFS score by 10/29/15 to demonstrate significant improvement in self percieved lower extremity function and improved ability to perform occupational tasks such as walking.   Baseline LEFS: 59/80   Status New   PT LONG TERM GOAL #2   Title Pt will demonstrate a score of <12% on her foot/ankle disability index by 10/29/15 to demonstrate significant improvement in self percieved ankle function and improved ability to perform occupational tasks such as climbing ladders   Baseline Foot/ankle disability index: 22%   Status New   PT  LONG TERM GOAL #3   Title Pt will be independent with exercise performance and progression aimed at improving ankle propioception, muscular endurance and motor control by 10/29/15 to continue improvement of symptoms after discharge from therapy   Baseline Dependent with exercise progression and performance requiring frequent cueing to perform.    Status New               Plan - 10/08/15 1045    Clinical  Impression Statement Good functional carryover between visits with patient demonstrating no episodes of pain or stability since the previous session. Continued to focus on improving hip and ankle stability to improve muscular coordination and endurance in the ankle/LE musculature. Patient will benefit from further skilled therapy to address limitations of weakness and decreased endurance in order to return to prior level of function.    Rehab Potential Good   Clinical Impairments Affecting Rehab Potential (-) Arnold-chiari malformation, job duties (+) Age, family support.   PT Frequency 2x / week   PT Duration 6 weeks   PT Treatment/Interventions Iontophoresis 4mg /ml Dexamethasone;Moist Heat;Therapeutic exercise;Therapeutic activities;Manual techniques;Electrical Stimulation;Cryotherapy;Patient/family education;Neuromuscular re-education;Ultrasound;Aquatic Therapy;Balance training   PT Next Visit Plan Progress standing ankle stabilization exercises   PT Home Exercise Plan  foot instrinsics strengthening, clamshells, hip extension, AROM ankle inversion/eversion   Consulted and Agree with Plan of Care Patient      Patient will benefit from skilled therapeutic intervention in order to improve the following deficits and impairments:  Decreased balance, Decreased mobility, Decreased strength, Increased edema, Hypermobility, Pain, Decreased endurance, Decreased coordination, Decreased range of motion  Visit Diagnosis: Pain in right ankle and joints of right foot  Muscle weakness (generalized)  Stiffness of right ankle, not elsewhere classified     Problem List There are no active problems to display for this patient.   Myrene GalasWesley Nixxon Faria, SPT 10/08/2015, 10:49 AM  [place] [facility] [address] [place], [place], [address] Phone: [phone]   Fax:  [phone]  Name: Angela Mccormick MRN: [id number] Date of Birth: Jul 30, 1985

## 2015-10-13 ENCOUNTER — Encounter: Payer: Self-pay | Admitting: Physical Therapy

## 2015-10-13 ENCOUNTER — Ambulatory Visit: Payer: BLUE CROSS/BLUE SHIELD | Admitting: Physical Therapy

## 2015-10-13 DIAGNOSIS — M25671 Stiffness of right ankle, not elsewhere classified: Secondary | ICD-10-CM | POA: Diagnosis not present

## 2015-10-13 DIAGNOSIS — M25571 Pain in right ankle and joints of right foot: Secondary | ICD-10-CM | POA: Diagnosis not present

## 2015-10-13 DIAGNOSIS — M6281 Muscle weakness (generalized): Secondary | ICD-10-CM

## 2015-10-13 NOTE — Therapy (Signed)
[place] [facility] PHYSICAL [facility] SPORTS MEDICINE [address], [place], [address] Phone: [phone]   Fax:  [phone]  Physical Therapy Treatment  Patient Details  Name: Angela Mccormick MRN: [id number] Date of Birth: April 05, 1986 Referring Provider: Martie Round AGNP  Encounter Date: 10/13/2015      PT End of Session - 10/13/15 1630    Visit Number 7   Number of Visits 12   Date for PT Re-Evaluation 10/29/15   PT Start Time 1616   PT Stop Time 1649   PT Time Calculation (min) 33 min   Activity Tolerance Patient tolerated treatment well   Behavior During Therapy [facility] for tasks assessed/performed      History reviewed. No pertinent past medical history.  History reviewed. No pertinent past surgical history.  There were no vitals filed for this visit.      Subjective Assessment - 10/13/15 1618    Subjective Patient states her ankle has been feeling good with no instances of instability since the previous visits. Overall patient states she's about 60% better since begining PT.   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   Currently in Pain? No/denies      Objective: Observation: Gait: Improved push off on R foot. Foot fasiculations noted throughout entire treatment.   Therapeutic Exercise: Patient performed exercises with guidance, verbal and tactile cues and demonstration of therapist:  Heel/toe raises on bosu -- x 15 Single leg stance ball toss at pitch back (with one leg supported on dynadisc) -- x 15 bilaterally, x5 B SLS without dynadisc Hip machine ABD -- 40# x20, EXT 80# x20 (performed Bilaterally) SLS on Bosu -- 3 x 20 sec with UE support  Running man in standing  (with foot behind) -- x15  Tandem rotations on Airex  -- x10 Monster walks with green resistance band -- x35 ft bilaterally    Patient response to treatment: No aggravation of symptoms noted during or after treatment session. Good demonstration of tandem stance rotations requiring minimal cueing for proper LE position to allow for proper joint alignment and technique.          PT Education - 10/13/15 1625    Education provided Yes   Education Details HEP: hip machine and running man exercise    Person(s) Educated Patient   Methods Explanation;Demonstration   Comprehension Verbalized understanding;Returned demonstration             PT Long Term Goals - 09/17/15 1239    PT LONG TERM GOAL #1   Title Pt will demonstrate a score of 69/80 on her LEFS score by 10/29/15 to demonstrate significant improvement in self percieved lower extremity function and improved ability to perform occupational tasks such as walking.   Baseline LEFS: 59/80   Status New   PT LONG TERM GOAL #2   Title Pt will demonstrate a score of <12% on her foot/ankle disability index by 10/29/15 to demonstrate significant improvement in self percieved ankle function and improved ability to perform occupational tasks such as climbing ladders   Baseline Foot/ankle disability index: 22%   Status New   PT LONG TERM GOAL #3   Title Pt will be independent with exercise performance and progression aimed at improving ankle propioception, muscular endurance and motor  control by 10/29/15 to continue improvement of symptoms after discharge from therapy   Baseline Dependent with exercise progression and performance requiring frequent cueing to perform.    Status New               Plan - 10/13/15 1745    Clinical Impression Statement Patient making progress towards long term goals demonstrating decreased foot fasiculations and required UE support with exercises indicating functional carryover between visits. Although  patient is improving, she continues to demonstrate early onset of fatigue with exercises and requires cueing for proper joint position indicating decreased muscular endurance and coordination and pt will benefit from further skilled therapy to return to prior level of function.     Rehab Potential Good   Clinical Impairments Affecting Rehab Potential (-) Arnold-chiari malformation, job duties (+) Age, family support.   PT Frequency 2x / week   PT Duration 6 weeks   PT Treatment/Interventions Iontophoresis 4mg /ml Dexamethasone;Moist Heat;Therapeutic exercise;Therapeutic activities;Manual techniques;Electrical Stimulation;Cryotherapy;Patient/family education;Neuromuscular re-education;Ultrasound;Aquatic Therapy;Balance training   PT Next Visit Plan Progress standing ankle stabilization exercises   PT Home Exercise Plan  foot instrinsics strengthening, clamshells, hip extension, AROM ankle inversion/eversion   Consulted and Agree with Plan of Care Patient      Patient will benefit from skilled therapeutic intervention in order to improve the following deficits and impairments:  Decreased balance, Decreased mobility, Decreased strength, Increased edema, Hypermobility, Pain, Decreased endurance, Decreased coordination, Decreased range of motion  Visit Diagnosis: Pain in right ankle and joints of right foot  Muscle weakness (generalized)  Stiffness of right ankle, not elsewhere classified     Problem List There are no active problems to display for this patient.   Angela Mccormick, SPT 10/13/2015, 5:48 PM  [place] [facility] [address] [place], [place], [address] Phone: [phone]   Fax:  [phone]  Name: Angela Mccormick MRN: [id number] Date of Birth: Jan 26, 1986

## 2015-10-14 DIAGNOSIS — N926 Irregular menstruation, unspecified: Secondary | ICD-10-CM | POA: Diagnosis not present

## 2015-10-14 DIAGNOSIS — Z36 Encounter for antenatal screening of mother: Secondary | ICD-10-CM | POA: Diagnosis not present

## 2015-10-14 DIAGNOSIS — Z130 Encounter for screening for diseases of the blood and blood-forming organs and certain disorders involving the immune mechanism: Secondary | ICD-10-CM | POA: Diagnosis not present

## 2015-10-15 ENCOUNTER — Ambulatory Visit: Payer: BLUE CROSS/BLUE SHIELD | Admitting: Physical Therapy

## 2015-10-19 ENCOUNTER — Ambulatory Visit: Payer: BLUE CROSS/BLUE SHIELD | Attending: Nurse Practitioner | Admitting: Physical Therapy

## 2015-10-19 ENCOUNTER — Encounter: Payer: Self-pay | Admitting: Physical Therapy

## 2015-10-19 DIAGNOSIS — M25671 Stiffness of right ankle, not elsewhere classified: Secondary | ICD-10-CM | POA: Diagnosis not present

## 2015-10-19 DIAGNOSIS — M25571 Pain in right ankle and joints of right foot: Secondary | ICD-10-CM | POA: Diagnosis not present

## 2015-10-19 DIAGNOSIS — M6281 Muscle weakness (generalized): Secondary | ICD-10-CM

## 2015-10-19 NOTE — Therapy (Signed)
[place] [facility] [address], [place], [address] Phone: [phone]   Fax:  [phone]  Physical Therapy Treatment  Patient Details  Name: Angela Mccormick MRN: [id number] Date of Birth: June 17, 1985 Referring Provider: Martie Round AGNP  Encounter Date: 10/19/2015      PT End of Session - 10/19/15 1921    Visit Number 8   Number of Visits 12   Date for PT Re-Evaluation 10/29/15   PT Start Time 1605   PT Stop Time 1645   PT Time Calculation (min) 40 min   Activity Tolerance Patient tolerated treatment well   Behavior During Therapy [facility] for tasks assessed/performed      History reviewed. No pertinent past medical history.  History reviewed. No pertinent past surgical history.  There were no vitals filed for this visit.      Subjective Assessment - 10/19/15 1607    Subjective Patient states her left ankle began hurting last night at work but has improved after waking in the morning. Patient states her right ankle is experiencing no pain currently and mentions she will be ready for discharge over the next treatment.   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   Currently in Pain? Yes   Pain Score 3    Pain Location Ankle   Pain Orientation Left   Pain Descriptors / Indicators Aching;Stabbing   Pain Type Chronic pain      Objective: Observation: Gait: Improved push off on R foot. Foot fasiculations noted throughout entire treatment.   Therapeutic Exercise: Patient performed exercises with guidance, verbal and tactile cues and demonstration of therapist:  SLS on Bosu -- 3 x 20 sec with UE support Heel/toe raises on bosu -- x 15 Rotational  step ups onto Bosu -- x15  (Performed bilaterally)with UE support Tandem rotations on Airex -- x15 (Performed bilaterally) Single leg stance ball toss at pitch back --  x15 B  Running man in standing (with foot behind) -- x20  Monster walks with green resistance band -- x48ft x 3 (Performed bilaterally )  Manual Therapy: STM to lateral ankle ligaments and peroneal tendons on the left foot/ankle to decrease pain and spasms with patient positioned in sitting.   Patient response to treatment: Decreased spasms by 50% after performing manual therapy. No aggravation of symptoms noted during or after treatment session. Good demonstration of running man exercise in standing requiring minimal cueing for proper LE position and joint alignment.           PT Education - 10/19/15 1922    Education provided Yes   Education Details Educated to continue performing HEP program and POC following the next visit.    Person(s) Educated Patient   Methods Explanation;Demonstration   Comprehension Returned demonstration;Verbalized understanding             PT Long Term Goals - 09/17/15 1239    PT LONG TERM GOAL #1   Title Pt will demonstrate a score of 69/80 on her LEFS score by 10/29/15 to demonstrate significant improvement in self percieved lower extremity function and improved ability to perform occupational tasks such as walking.   Baseline LEFS: 59/80   Status New   PT LONG TERM GOAL #2   Title Pt  will demonstrate a score of <12% on her foot/ankle disability index by 10/29/15 to demonstrate significant improvement in self percieved ankle function and improved ability to perform occupational tasks such as climbing ladders   Baseline Foot/ankle disability index: 22%   Status New   PT LONG TERM GOAL #3   Title Pt will be independent with exercise performance and progression aimed at improving ankle propioception, muscular endurance and motor control by 10/29/15 to continue improvement of  symptoms after discharge from therapy   Baseline Dependent with exercise progression and performance requiring frequent cueing to perform.    Status New               Plan - 10/19/15 1917    Clinical Impression Statement Patient demonstrates no increase in ankle aggravation after exercise performance today indicating improved muscular coordination and function. Patinet required little to no cueing for exercise performance indicating improved motor contorol and will benefit from further skilled therapy to return to prior level of function.    Rehab Potential Good   Clinical Impairments Affecting Rehab Potential (-) Arnold-chiari malformation, job duties (+) Age, family support.   PT Frequency 2x / week   PT Duration 6 weeks   PT Treatment/Interventions Iontophoresis 4mg /ml Dexamethasone;Moist Heat;Therapeutic exercise;Therapeutic activities;Manual techniques;Electrical Stimulation;Cryotherapy;Patient/family education;Neuromuscular re-education;Ultrasound;Aquatic Therapy;Balance training   PT Next Visit Plan Progress standing ankle stabilization exercises, discharge   PT Home Exercise Plan  foot instrinsics strengthening, clamshells, hip extension, AROM ankle inversion/eversion   Consulted and Agree with Plan of Care Patient      Patient will benefit from skilled therapeutic intervention in order to improve the following deficits and impairments:  Decreased balance, Decreased mobility, Decreased strength, Increased edema, Hypermobility, Pain, Decreased endurance, Decreased coordination, Decreased range of motion  Visit Diagnosis: Pain in right ankle and joints of right foot  Muscle weakness (generalized)  Stiffness of right ankle, not elsewhere classified     Problem List There are no active problems to display for this patient.   Myrene GalasWesley Roper Tolson, SPT 10/19/2015, 7:23 PM  [facility] [address]  [place], [place], [address] Phone: [phone]   Fax:  [phone]  Name: Angela Mccormick MRN: [id number] Date of Birth: June 10, 1985

## 2015-10-22 ENCOUNTER — Ambulatory Visit: Payer: BLUE CROSS/BLUE SHIELD | Admitting: Physical Therapy

## 2015-10-22 ENCOUNTER — Encounter: Payer: Self-pay | Admitting: Physical Therapy

## 2015-10-22 DIAGNOSIS — M25571 Pain in right ankle and joints of right foot: Secondary | ICD-10-CM

## 2015-10-22 DIAGNOSIS — M25671 Stiffness of right ankle, not elsewhere classified: Secondary | ICD-10-CM

## 2015-10-22 DIAGNOSIS — M6281 Muscle weakness (generalized): Secondary | ICD-10-CM

## 2015-10-22 NOTE — Therapy (Signed)
[place] [facility] [address], [place], [address] Phone: [phone]   Fax:  [phone]  Physical Therapy Treatment  Patient Details  Name: Angela Mccormick MRN: [id number] Date of Birth: January 09, 1986 Referring Provider: Martie Round AGNP  Encounter Date: 10/22/2015      PT End of Session - 10/22/15 0946    Visit Number 9   Number of Visits 12   Date for PT Re-Evaluation 10/29/15   PT Start Time 0846   PT Stop Time 0930   PT Time Calculation (min) 44 min   Activity Tolerance Patient tolerated treatment well   Behavior During Therapy [facility] for tasks assessed/performed      History reviewed. No pertinent past medical history.  History reviewed. No pertinent past surgical history.  There were no vitals filed for this visit.      Subjective Assessment - 10/22/15 0851    Subjective Patient states she fell into a hole resultign in a fall when walking in her lawn. States she would like to monitor symptoms over the next week to access preparation for discharge.   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   Currently in Pain? Yes   Pain Score 4    Pain Location Ankle   Pain Orientation Right   Pain Descriptors / Indicators Aching;Stabbing   Pain Type Chronic pain      Objective: Observation: Gait: Improved push off on R foot. Foot fasiculations noted throughout entire treatment. Palpation: Increased muscle guarding and spasms noted along talocrural joint and along tibialis posterior tendon   Therapeutic Exercise: Patient performed exercises with guidance, verbal and tactile cues and demonstration of therapist:  Wobbleboard in standing with UE --  forward/backward, laterally -- x 10 Marching on bilateral dynadiscs with UE support Side stepping onto Bosu -- x10 Heel/toe raises on bosu -- x 15 Single leg stance ball toss at pitch back with balance stone under foot-- x10 B  Trampoline stepping with UE support -- 3 min Monster walks with green resistance band -- (Performed bilaterally) 17 x 2 with ball tosses  Manual Therapy: STM to lateral ankle ligaments, deep toe flexors, and peroneal tendons on the left foot/ankle to decrease pain and spasms with patient positioned in sitting. With talocrural joint anterior-posterior glides in long sitting-- 3 x 30sec, subtalar distraction in long sitting -- 30sec x 2, medial/lateral glides in long sitting -- 30sec x 2  Patient response to treatment: Decreased spasms and pain by 75% after performing manual therapy. No aggravation of symptoms noted during or after treatment session. Improved pain-free ankle dorsiflexion/plantarflexoin AROM after performing wobbleboard exercise indicating decreased motor control and guarding.           PT Education - 10/22/15 0947    Education provided Yes   Education Details HEP: Marching on bilateral dynadiscs, trampoline walking   Person(s) Educated Patient   Methods Explanation;Demonstration   Comprehension Verbalized understanding;Returned demonstration             PT Long Term Goals - 09/17/15 1239    PT LONG TERM GOAL #1   Title Pt will demonstrate a score of 69/80 on her LEFS score by 10/29/15 to demonstrate significant improvement in self percieved lower extremity function and improved ability to  perform occupational tasks such as walking.   Baseline LEFS: 59/80   Status New   PT LONG TERM GOAL #2   Title Pt will demonstrate a score of <12% on her foot/ankle disability index by 10/29/15 to demonstrate significant improvement in self percieved ankle function and improved ability to perform occupational tasks such as climbing ladders   Baseline  Foot/ankle disability index: 22%   Status New   PT LONG TERM GOAL #3   Title Pt will be independent with exercise performance and progression aimed at improving ankle propioception, muscular endurance and motor control by 10/29/15 to continue improvement of symptoms after discharge from therapy   Baseline Dependent with exercise progression and performance requiring frequent cueing to perform.    Status New               Plan - 10/22/15 [id number]    Clinical Impression Statement Decreased exercises performed today secondary to aggrvation of symptoms and patient's intital symptoms greatly improved after performing manual therapy techniques indicating decreased muscle spasms, joint mobility, and guarding. Patient requires frequent UE support  during therapy indication decreased muscular coordination and proprioception and patient will benefit from further skilled therapy to return to prior level of function.    Rehab Potential Good   Clinical Impairments Affecting Rehab Potential (-) Arnold-chiari malformation, job duties (+) Age, family support.   PT Frequency 2x / week   PT Duration 6 weeks   PT Treatment/Interventions Iontophoresis 4mg /ml Dexamethasone;Moist Heat;Therapeutic exercise;Therapeutic activities;Manual techniques;Electrical Stimulation;Cryotherapy;Patient/family education;Neuromuscular re-education;Ultrasound;Aquatic Therapy;Balance training   PT Next Visit Plan Progress standing ankle stabilization exercises, plan discharge over next visit   PT Home Exercise Plan  foot instrinsics strengthening, clamshells, hip extension, AROM ankle inversion/eversion   Consulted and Agree with Plan of Care Patient      Patient will benefit from skilled therapeutic intervention in order to improve the following deficits and impairments:  Decreased balance, Decreased mobility, Decreased strength, Increased edema, Hypermobility, Pain, Decreased endurance, Decreased coordination, Decreased range of  motion  Visit Diagnosis: Pain in right ankle and joints of right foot  Muscle weakness (generalized)  Stiffness of right ankle, not elsewhere classified     Problem List There are no active problems to display for this patient.   Myrene GalasWesley Jaziah Kwasnik, SPT 10/22/2015, 9:49 AM  [place] [facility] [address] [place], [place], [address] Phone: [phone]   Fax:  [phone]  Name: Angela Mccormick MRN: [id number] Date of Birth: [birth year]

## 2015-11-01 ENCOUNTER — Encounter: Payer: BLUE CROSS/BLUE SHIELD | Admitting: Physical Therapy

## 2015-12-09 DIAGNOSIS — Z008 Encounter for other general examination: Secondary | ICD-10-CM | POA: Diagnosis not present

## 2015-12-09 DIAGNOSIS — Z36 Encounter for antenatal screening of mother: Secondary | ICD-10-CM | POA: Diagnosis not present

## 2015-12-09 DIAGNOSIS — Z1389 Encounter for screening for other disorder: Secondary | ICD-10-CM | POA: Diagnosis not present

## 2015-12-09 DIAGNOSIS — E669 Obesity, unspecified: Secondary | ICD-10-CM | POA: Diagnosis not present

## 2015-12-09 DIAGNOSIS — R946 Abnormal results of thyroid function studies: Secondary | ICD-10-CM | POA: Diagnosis not present

## 2015-12-09 DIAGNOSIS — R7301 Impaired fasting glucose: Secondary | ICD-10-CM | POA: Diagnosis not present

## 2016-02-29 DIAGNOSIS — Z23 Encounter for immunization: Secondary | ICD-10-CM | POA: Diagnosis not present

## 2016-02-29 DIAGNOSIS — Z309 Encounter for contraceptive management, unspecified: Secondary | ICD-10-CM | POA: Diagnosis not present

## 2016-02-29 DIAGNOSIS — Z789 Other specified health status: Secondary | ICD-10-CM | POA: Diagnosis not present

## 2016-08-02 DIAGNOSIS — J019 Acute sinusitis, unspecified: Secondary | ICD-10-CM | POA: Diagnosis not present

## 2016-08-15 DIAGNOSIS — K521 Toxic gastroenteritis and colitis: Secondary | ICD-10-CM | POA: Diagnosis not present

## 2016-08-15 DIAGNOSIS — J019 Acute sinusitis, unspecified: Secondary | ICD-10-CM | POA: Diagnosis not present

## 2016-08-15 DIAGNOSIS — R05 Cough: Secondary | ICD-10-CM | POA: Diagnosis not present

## 2016-08-30 DIAGNOSIS — Z6832 Body mass index (BMI) 32.0-32.9, adult: Secondary | ICD-10-CM | POA: Diagnosis not present

## 2016-08-30 DIAGNOSIS — G43909 Migraine, unspecified, not intractable, without status migrainosus: Secondary | ICD-10-CM | POA: Diagnosis not present

## 2016-08-30 DIAGNOSIS — G43009 Migraine without aura, not intractable, without status migrainosus: Secondary | ICD-10-CM | POA: Diagnosis not present

## 2016-10-03 DIAGNOSIS — R591 Generalized enlarged lymph nodes: Secondary | ICD-10-CM | POA: Diagnosis not present

## 2016-10-31 ENCOUNTER — Ambulatory Visit (INDEPENDENT_AMBULATORY_CARE_PROVIDER_SITE_OTHER): Payer: BLUE CROSS/BLUE SHIELD | Admitting: Family Medicine

## 2016-10-31 ENCOUNTER — Encounter: Payer: Self-pay | Admitting: Family Medicine

## 2016-10-31 VITALS — BP 110/68 | HR 80 | Temp 98.6°F | Resp 12 | Ht 63.39 in | Wt 186.6 lb

## 2016-10-31 DIAGNOSIS — R221 Localized swelling, mass and lump, neck: Secondary | ICD-10-CM | POA: Diagnosis not present

## 2016-10-31 DIAGNOSIS — Z114 Encounter for screening for human immunodeficiency virus [HIV]: Secondary | ICD-10-CM

## 2016-10-31 DIAGNOSIS — Z130 Encounter for screening for diseases of the blood and blood-forming organs and certain disorders involving the immune mechanism: Secondary | ICD-10-CM

## 2016-10-31 DIAGNOSIS — E669 Obesity, unspecified: Secondary | ICD-10-CM

## 2016-10-31 DIAGNOSIS — R21 Rash and other nonspecific skin eruption: Secondary | ICD-10-CM | POA: Diagnosis not present

## 2016-10-31 MED ORDER — CLOTRIMAZOLE 1 % EX CREA: 1.0000 "application " | TOPICAL_CREAM | Freq: Two times a day (BID) | CUTANEOUS | 0 refills | Status: DC

## 2016-10-31 MED ORDER — ALBUTEROL SULFATE HFA 108 (90 BASE) MCG/ACT IN AERS: 2.0000 | INHALATION_SPRAY | Freq: Four times a day (QID) | RESPIRATORY_TRACT | 1 refills | Status: DC | PRN

## 2016-10-31 NOTE — Patient Instructions (Signed)
Use the topical agent as prescribed.  We will call with your labs.  Follow up annually.   Take care  Dr. Lacinda Axon   Health Maintenance, Female Adopting a healthy lifestyle and getting preventive care can go a long way to promote health and wellness. Talk with your health care provider about what schedule of regular examinations is right for you. This is a good chance for you to check in with your provider about disease prevention and staying healthy. In between checkups, there are plenty of things you can do on your own. Experts have done a lot of research about which lifestyle changes and preventive measures are most likely to keep you healthy. Ask your health care provider for more information. Weight and diet Eat a healthy diet  Be sure to include plenty of vegetables, fruits, low-fat dairy products, and lean protein.  Do not eat a lot of foods high in solid fats, added sugars, or salt.  Get regular exercise. This is one of the most important things you can do for your health. ? Most adults should exercise for at least 150 minutes each week. The exercise should increase your heart rate and make you sweat (moderate-intensity exercise). ? Most adults should also do strengthening exercises at least twice a week. This is in addition to the moderate-intensity exercise.  Maintain a healthy weight  Body mass index (BMI) is a measurement that can be used to identify possible weight problems. It estimates body fat based on height and weight. Your health care provider can help determine your BMI and help you achieve or maintain a healthy weight.  For females 2 years of age and older: ? A BMI below 18.5 is considered underweight. ? A BMI of 18.5 to 24.9 is normal. ? A BMI of 25 to 29.9 is considered overweight. ? A BMI of 30 and above is considered obese.  Watch levels of cholesterol and blood lipids  You should start having your blood tested for lipids and cholesterol at 29 years of age,  then have this test every 5 years.  You may need to have your cholesterol levels checked more often if: ? Your lipid or cholesterol levels are high. ? You are older than 29 years of age. ? You are at high risk for heart disease.  Cancer screening Lung Cancer  Lung cancer screening is recommended for adults 79-78 years old who are at high risk for lung cancer because of a history of smoking.  A yearly low-dose CT scan of the lungs is recommended for people who: ? Currently smoke. ? Have quit within the past 15 years. ? Have at least a 30-pack-year history of smoking. A pack year is smoking an average of one pack of cigarettes a day for 1 year.  Yearly screening should continue until it has been 15 years since you quit.  Yearly screening should stop if you develop a health problem that would prevent you from having lung cancer treatment.  Breast Cancer  Practice breast self-awareness. This means understanding how your breasts normally appear and feel.  It also means doing regular breast self-exams. Let your health care provider know about any changes, no matter how small.  If you are in your 20s or 30s, you should have a clinical breast exam (CBE) by a health care provider every 1-3 years as part of a regular health exam.  If you are 78 or older, have a CBE every year. Also consider having a breast X-ray (mammogram) every year.  If you have a family history of breast cancer, talk to your health care provider about genetic screening.  If you are at high risk for breast cancer, talk to your health care provider about having an MRI and a mammogram every year.  Breast cancer gene (BRCA) assessment is recommended for women who have family members with BRCA-related cancers. BRCA-related cancers include: ? Breast. ? Ovarian. ? Tubal. ? Peritoneal cancers.  Results of the assessment will determine the need for genetic counseling and BRCA1 and BRCA2 testing.  Cervical Cancer Your  health care provider may recommend that you be screened regularly for cancer of the pelvic organs (ovaries, uterus, and vagina). This screening involves a pelvic examination, including checking for microscopic changes to the surface of your cervix (Pap test). You may be encouraged to have this screening done every 3 years, beginning at age 53.  For women ages 32-65, health care providers may recommend pelvic exams and Pap testing every 3 years, or they may recommend the Pap and pelvic exam, combined with testing for human papilloma virus (HPV), every 5 years. Some types of HPV increase your risk of cervical cancer. Testing for HPV may also be done on women of any age with unclear Pap test results.  Other health care providers may not recommend any screening for nonpregnant women who are considered low risk for pelvic cancer and who do not have symptoms. Ask your health care provider if a screening pelvic exam is right for you.  If you have had past treatment for cervical cancer or a condition that could lead to cancer, you need Pap tests and screening for cancer for at least 20 years after your treatment. If Pap tests have been discontinued, your risk factors (such as having a new sexual partner) need to be reassessed to determine if screening should resume. Some women have medical problems that increase the chance of getting cervical cancer. In these cases, your health care provider may recommend more frequent screening and Pap tests.  Colorectal Cancer  This type of cancer can be detected and often prevented.  Routine colorectal cancer screening usually begins at 29 years of age and continues through 30 years of age.  Your health care provider may recommend screening at an earlier age if you have risk factors for colon cancer.  Your health care provider may also recommend using home test kits to check for hidden blood in the stool.  A small camera at the end of a tube can be used to examine your  colon directly (sigmoidoscopy or colonoscopy). This is done to check for the earliest forms of colorectal cancer.  Routine screening usually begins at age 67.  Direct examination of the colon should be repeated every 5-10 years through 29 years of age. However, you may need to be screened more often if early forms of precancerous polyps or small growths are found.  Skin Cancer  Check your skin from head to toe regularly.  Tell your health care provider about any new moles or changes in moles, especially if there is a change in a mole's shape or color.  Also tell your health care provider if you have a mole that is larger than the size of a pencil eraser.  Always use sunscreen. Apply sunscreen liberally and repeatedly throughout the day.  Protect yourself by wearing long sleeves, pants, a wide-brimmed hat, and sunglasses whenever you are outside.  Heart disease, diabetes, and high blood pressure  High blood pressure causes heart disease and  increases the risk of stroke. High blood pressure is more likely to develop in: ? People who have blood pressure in the high end of the normal range (130-139/85-89 mm Hg). ? People who are overweight or obese. ? People who are African American.  If you are 62-61 years of age, have your blood pressure checked every 3-5 years. If you are 44 years of age or older, have your blood pressure checked every year. You should have your blood pressure measured twice-once when you are at a hospital or clinic, and once when you are not at a hospital or clinic. Record the average of the two measurements. To check your blood pressure when you are not at a hospital or clinic, you can use: ? An automated blood pressure machine at a pharmacy. ? A home blood pressure monitor.  If you are between 17 years and 38 years old, ask your health care provider if you should take aspirin to prevent strokes.  Have regular diabetes screenings. This involves taking a blood sample  to check your fasting blood sugar level. ? If you are at a normal weight and have a low risk for diabetes, have this test once every three years after 29 years of age. ? If you are overweight and have a high risk for diabetes, consider being tested at a younger age or more often. Preventing infection Hepatitis B  If you have a higher risk for hepatitis B, you should be screened for this virus. You are considered at high risk for hepatitis B if: ? You were born in a country where hepatitis B is common. Ask your health care provider which countries are considered high risk. ? Your parents were born in a high-risk country, and you have not been immunized against hepatitis B (hepatitis B vaccine). ? You have HIV or AIDS. ? You use needles to inject street drugs. ? You live with someone who has hepatitis B. ? You have had sex with someone who has hepatitis B. ? You get hemodialysis treatment. ? You take certain medicines for conditions, including cancer, organ transplantation, and autoimmune conditions.  Hepatitis C  Blood testing is recommended for: ? Everyone born from [birth year] through 1963. ? Anyone with known risk factors for hepatitis C.  Sexually transmitted infections (STIs)  You should be screened for sexually transmitted infections (STIs) including gonorrhea and chlamydia if: ? You are sexually active and are younger than 29 years of age. ? You are older than 29 years of age and your health care provider tells you that you are at risk for this type of infection. ? Your sexual activity has changed since you were last screened and you are at an increased risk for chlamydia or gonorrhea. Ask your health care provider if you are at risk.  If you do not have HIV, but are at risk, it may be recommended that you take a prescription medicine daily to prevent HIV infection. This is called pre-exposure prophylaxis (PrEP). You are considered at risk if: ? You are sexually active and do not  regularly use condoms or know the HIV status of your partner(s). ? You take drugs by injection. ? You are sexually active with a partner who has HIV.  Talk with your health care provider about whether you are at high risk of being infected with HIV. If you choose to begin PrEP, you should first be tested for HIV. You should then be tested every 3 months for as long as you are taking  PrEP. Pregnancy  If you are premenopausal and you may become pregnant, ask your health care provider about preconception counseling.  If you may become pregnant, take 400 to 800 micrograms (mcg) of folic acid every day.  If you want to prevent pregnancy, talk to your health care provider about birth control (contraception). Osteoporosis and menopause  Osteoporosis is a disease in which the bones lose minerals and strength with aging. This can result in serious bone fractures. Your risk for osteoporosis can be identified using a bone density scan.  If you are 62 years of age or older, or if you are at risk for osteoporosis and fractures, ask your health care provider if you should be screened.  Ask your health care provider whether you should take a calcium or vitamin D supplement to lower your risk for osteoporosis.  Menopause may have certain physical symptoms and risks.  Hormone replacement therapy may reduce some of these symptoms and risks. Talk to your health care provider about whether hormone replacement therapy is right for you. Follow these instructions at home:  Schedule regular health, dental, and eye exams.  Stay current with your immunizations.  Do not use any tobacco products including cigarettes, chewing tobacco, or electronic cigarettes.  If you are pregnant, do not drink alcohol.  If you are breastfeeding, limit how much and how often you drink alcohol.  Limit alcohol intake to no more than 1 drink per day for nonpregnant women. One drink equals 12 ounces of beer, 5 ounces of wine, or  1 ounces of hard liquor.  Do not use street drugs.  Do not share needles.  Ask your health care provider for help if you need support or information about quitting drugs.  Tell your health care provider if you often feel depressed.  Tell your health care provider if you have ever been abused or do not feel safe at home. This information is not intended to replace advice given to you by your health care provider. Make sure you discuss any questions you have with your health care provider. Document Released: 11/14/2010 Document Revised: 10/07/2015 Document Reviewed: 02/02/2015 Elsevier Interactive Patient Education  [employer].

## 2016-10-31 NOTE — Progress Notes (Signed)
Pre-visit discussion using our clinic review tool. No additional management support is needed unless otherwise documented below in the visit note.  

## 2016-11-01 ENCOUNTER — Encounter: Payer: Self-pay | Admitting: Family Medicine

## 2016-11-01 DIAGNOSIS — R221 Localized swelling, mass and lump, neck: Secondary | ICD-10-CM | POA: Insufficient documentation

## 2016-11-01 DIAGNOSIS — R21 Rash and other nonspecific skin eruption: Secondary | ICD-10-CM | POA: Insufficient documentation

## 2016-11-01 LAB — LIPID PANEL
Cholesterol: 123 mg/dL (ref 0–200)
HDL: 34.8 mg/dL — ABNORMAL LOW (ref >39.00)
LDL Cholesterol: 65 mg/dL (ref 0–99)
NonHDL: 88.57
Total CHOL/HDL Ratio: 4
Triglycerides: 116 mg/dL (ref 0.0–149.0)
VLDL: 23.2 mg/dL (ref 0.0–40.0)

## 2016-11-01 LAB — CBC
HCT: 35.8 % — ABNORMAL LOW (ref 36.0–46.0)
Hemoglobin: 11.9 g/dL — ABNORMAL LOW (ref 12.0–15.0)
MCHC: 33.3 g/dL (ref 30.0–36.0)
MCV: 84.7 fl (ref 78.0–100.0)
Platelets: 291 10*3/uL (ref 150.0–400.0)
RBC: 4.23 Mil/uL (ref 3.87–5.11)
RDW: 14.3 % (ref 11.5–15.5)
WBC: 2.9 10*3/uL — ABNORMAL LOW (ref 4.0–10.5)

## 2016-11-01 LAB — COMPREHENSIVE METABOLIC PANEL
ALT: 35 U/L (ref 0–35)
AST: 26 U/L (ref 0–37)
Albumin: 3.9 g/dL (ref 3.5–5.2)
Alkaline Phosphatase: 60 U/L (ref 39–117)
BUN: 9 mg/dL (ref 6–23)
CO2: 28 mEq/L (ref 19–32)
Calcium: 9.2 mg/dL (ref 8.4–10.5)
Chloride: 105 mEq/L (ref 96–112)
Creatinine, Ser: 0.7 mg/dL (ref 0.40–1.20)
GFR: 104.85 mL/min (ref >60.00)
Glucose, Bld: 90 mg/dL (ref 70–99)
Potassium: 3.7 mEq/L (ref 3.5–5.1)
Sodium: 139 mEq/L (ref 135–145)
Total Bilirubin: 0.3 mg/dL (ref 0.2–1.2)
Total Protein: 6.7 g/dL (ref 6.0–8.3)

## 2016-11-01 LAB — HEMOGLOBIN A1C: Hgb A1c MFr Bld: 5.3 % (ref 4.6–6.5)

## 2016-11-01 LAB — HIV ANTIBODY (ROUTINE TESTING W REFLEX): HIV 1&2 Ab, 4th Generation: NONREACTIVE

## 2016-11-01 NOTE — Progress Notes (Signed)
Subjective:  Patient ID: Angela Mccormick, female    DOB: 1986-01-10  Age: 31 y.o. MRN: [id number]  CC: Establish care - concern for posterior neck lymph node, redness/itching - umbilicus.  HPI Angela Mccormick is a 31 y.o. female presents to the clinic today with the above complaints.  Posterior neck, lymph node  Patient reports she has had a nodule on the back of her neck for the past month.  Nontender.  Noticed incidentally.  She is concerned that this lymph node.  No recent illness.  No associated fevers or chills.  No other associated symptoms.  No known exacerbating or relieving factors.  Rash  Patient reports recent issues with redness and itching of her umbilicus.  She states that often occurs when it's hot.  Very pruritic and red.  No known inciting factor.  No known relieving factors. No interventions tried.  Mild in severity.  PMH, Surgical Hx, Family Hx, Social History reviewed and updated as below.  Past Medical History:  Diagnosis Date  . Asthma   . Depression   . History of Chiari malformation   . History of syncope   . Migraine    Past Surgical History:  Procedure Laterality Date  . NO PAST SURGERIES     Family History  Problem Relation Age of Onset  . Colon cancer Mother   . Heart disease Mother   . Diabetes Mother   . Diabetes Father    Social History  Substance Use Topics  . Smoking status: Never Smoker  . Smokeless tobacco: Not on file  . Alcohol use Not on file    Review of Systems  Skin:       Redness/itching - umbilicus.  Neurological: Positive for headaches.  Hematological: Positive for adenopathy.  All other systems reviewed and are negative.   Objective:   Today's Vitals: BP 110/68 (BP Location: Left Arm, Patient Position: Sitting, Cuff Size: Normal)   Pulse 80   Temp 98.6 F (37 C) (Oral)   Resp 12   Ht 5' 3.39" (1.61 m)   Wt 186 lb 9.6 oz (84.6 kg)   LMP 10/17/2016   SpO2 98%   BMI 32.65 kg/m    Physical Exam  Constitutional: She is oriented to person, place, and time. She appears well-developed. No distress.  HENT:  Head: Normocephalic and atraumatic.  Mouth/Throat: Oropharynx is clear and moist.  Eyes: Conjunctivae are normal. Right eye exhibits no discharge. Left eye exhibits no discharge. No scleral icterus.  Neck: Neck supple. No thyromegaly present.  No appreciable lymphadenopathy.  Cardiovascular: Normal rate and regular rhythm.   No murmur heard. Pulmonary/Chest: Effort normal and breath sounds normal. She has no wheezes. She has no rales.  Abdominal: Soft. She exhibits no distension. There is no tenderness. There is no rebound and no guarding.  Neurological: She is alert and oriented to person, place, and time.  No focal deficits.  Skin:  Umbilicus - erythema noted. Mild maceration noted.   Psychiatric: She has a normal mood and affect.  Vitals reviewed.  Assessment & Plan:   Problem List Items Addressed This Visit      Musculoskeletal and Integument   Rash    New problem. Appears fungal/yeast in nature. Treating with Clotrimazole.         Other   Nodule of neck - Primary    New problem. No apparent mass/nodule/lymph node noted on exam. Reassurance provided.       Other Visit Diagnoses  Screening for deficiency anemia       Relevant Orders   CBC   Obesity (BMI 30-39.9)       Relevant Orders   Hemoglobin A1c   Comprehensive metabolic panel   Lipid panel   Screening for HIV (human immunodeficiency virus)       Relevant Orders   HIV antibody (with reflex) (Completed)     Meds ordered this encounter  Medications  . albuterol (PROVENTIL HFA;VENTOLIN HFA) 108 (90 Base) MCG/ACT inhaler    Sig: Inhale 2 puffs into the lungs every 6 (six) hours as needed for wheezing or shortness of breath.    Dispense:  18 g    Refill:  1  . clotrimazole (LOTRIMIN) 1 % cream    Sig: Apply 1 application topically 2 (two) times daily.    Dispense:  30 g     Refill:  0   Follow-up: Annually  Angela OtherJayce Kamerin Grumbine DO [facility] [place]

## 2016-11-01 NOTE — Assessment & Plan Note (Signed)
New problem. No apparent mass/nodule/lymph node noted on exam. Reassurance provided.

## 2016-11-01 NOTE — Assessment & Plan Note (Signed)
New problem. Appears fungal/yeast in nature. Treating with Clotrimazole.

## 2016-11-02 ENCOUNTER — Other Ambulatory Visit: Payer: Self-pay | Admitting: Family Medicine

## 2016-11-02 DIAGNOSIS — D649 Anemia, unspecified: Secondary | ICD-10-CM

## 2017-01-25 ENCOUNTER — Other Ambulatory Visit: Payer: BLUE CROSS/BLUE SHIELD

## 2017-01-25 ENCOUNTER — Ambulatory Visit: Payer: BLUE CROSS/BLUE SHIELD

## 2017-01-25 ENCOUNTER — Other Ambulatory Visit (INDEPENDENT_AMBULATORY_CARE_PROVIDER_SITE_OTHER): Payer: BLUE CROSS/BLUE SHIELD

## 2017-01-25 DIAGNOSIS — D649 Anemia, unspecified: Secondary | ICD-10-CM

## 2017-01-25 LAB — CBC
HCT: 39.2 % (ref 36.0–46.0)
Hemoglobin: 12.7 g/dL (ref 12.0–15.0)
MCHC: 32.4 g/dL (ref 30.0–36.0)
MCV: 83.9 fl (ref 78.0–100.0)
Platelets: 333 10*3/uL (ref 150.0–400.0)
RBC: 4.67 Mil/uL (ref 3.87–5.11)
RDW: 15.5 % (ref 11.5–15.5)
WBC: 6.9 10*3/uL (ref 4.0–10.5)

## 2017-03-05 ENCOUNTER — Ambulatory Visit (INDEPENDENT_AMBULATORY_CARE_PROVIDER_SITE_OTHER): Payer: BLUE CROSS/BLUE SHIELD

## 2017-03-05 DIAGNOSIS — Z23 Encounter for immunization: Secondary | ICD-10-CM | POA: Diagnosis not present

## 2017-04-11 ENCOUNTER — Encounter: Payer: Self-pay | Admitting: Family Medicine

## 2017-04-13 ENCOUNTER — Ambulatory Visit (INDEPENDENT_AMBULATORY_CARE_PROVIDER_SITE_OTHER): Payer: BLUE CROSS/BLUE SHIELD | Admitting: Family Medicine

## 2017-04-13 ENCOUNTER — Other Ambulatory Visit: Payer: Self-pay

## 2017-04-13 ENCOUNTER — Encounter: Payer: Self-pay | Admitting: Family Medicine

## 2017-04-13 VITALS — BP 110/60 | HR 73 | Temp 98.2°F | Wt 193.6 lb

## 2017-04-13 DIAGNOSIS — Z789 Other specified health status: Secondary | ICD-10-CM | POA: Diagnosis not present

## 2017-04-13 DIAGNOSIS — E669 Obesity, unspecified: Secondary | ICD-10-CM | POA: Diagnosis not present

## 2017-04-13 DIAGNOSIS — Z6837 Body mass index (BMI) 37.0-37.9, adult: Secondary | ICD-10-CM | POA: Insufficient documentation

## 2017-04-13 DIAGNOSIS — R7309 Other abnormal glucose: Secondary | ICD-10-CM | POA: Insufficient documentation

## 2017-04-13 LAB — LIPID PANEL
Cholesterol: 140 mg/dL (ref 0–200)
HDL: 47.8 mg/dL (ref >39.00)
LDL Cholesterol: 76 mg/dL (ref 0–99)
NonHDL: 91.77
Total CHOL/HDL Ratio: 3
Triglycerides: 77 mg/dL (ref 0.0–149.0)
VLDL: 15.4 mg/dL (ref 0.0–40.0)

## 2017-04-13 LAB — HEMOGLOBIN A1C: Hgb A1c MFr Bld: 5.2 % (ref 4.6–6.5)

## 2017-04-13 LAB — COMPREHENSIVE METABOLIC PANEL
ALT: 20 U/L (ref 0–35)
AST: 18 U/L (ref 0–37)
Albumin: 4.2 g/dL (ref 3.5–5.2)
Alkaline Phosphatase: 64 U/L (ref 39–117)
BUN: 11 mg/dL (ref 6–23)
CO2: 28 mEq/L (ref 19–32)
Calcium: 9.4 mg/dL (ref 8.4–10.5)
Chloride: 102 mEq/L (ref 96–112)
Creatinine, Ser: 0.73 mg/dL (ref 0.40–1.20)
GFR: 99.59 mL/min (ref >60.00)
Glucose, Bld: 89 mg/dL (ref 70–99)
Potassium: 4 mEq/L (ref 3.5–5.1)
Sodium: 136 mEq/L (ref 135–145)
Total Bilirubin: 0.7 mg/dL (ref 0.2–1.2)
Total Protein: 7.5 g/dL (ref 6.0–8.3)

## 2017-04-13 LAB — HCG, QUANTITATIVE, PREGNANCY: Quantitative HCG: 0.3 m[IU]/mL

## 2017-04-13 NOTE — Assessment & Plan Note (Signed)
Discussed dietary changes at length with patient.  Diet instructions given.  Encouraged exercise.

## 2017-04-13 NOTE — Assessment & Plan Note (Signed)
Elevated on several occasions.  Noted up to 213 about 4 hours after eating.  Strong family history of diabetes.  Some polydipsia though no other symptoms.  No weight loss.  We will check an A1c.  Determine management after lab work returns.

## 2017-04-13 NOTE — Progress Notes (Signed)
  Angela Rumps, MD Phone: [phone]  Angela Mccormick is a 31 y.o. female who presents today for follow-up.  Elevated glucose: Has been up to 213 several hours after eating.  Has a strong family history of diabetes.  Some polydipsia.  No polyuria.  She first checked her blood glucose when she was sitting there and felt like she was drunk though had not been drinking.  She walks intermittently for exercise.  Does work on a farm occasionally.  Eats all the bad stuff.  Was drinking 4 cans of Pepsi a day.  She has cut that down to 1.  She does report she has been trying to get pregnant.  They have been trying since September.  Her last menstrual period was on 03/31/17. She reports regular menstrual cycles.  She wonders if there are any medication she should not be taking.  I discussed her headache medications with her.  She is not on Celexa.  Social History   Tobacco Use  Smoking Status Never Smoker  Smokeless Tobacco Never Used     ROS see history of present illness  Objective  Physical Exam Vitals:   04/13/17 1406  BP: 110/60  Pulse: 73  Temp: 98.2 F (36.8 C)  SpO2: 96%    BP Readings from Last 3 Encounters:  04/13/17 110/60  10/31/16 110/68   Wt Readings from Last 3 Encounters:  04/13/17 193 lb 9.6 oz (87.8 kg)  10/31/16 186 lb 9.6 oz (84.6 kg)    Physical Exam  Constitutional: No distress.  Cardiovascular: Normal rate, regular rhythm and normal heart sounds.  Pulmonary/Chest: Effort normal and breath sounds normal.  Neurological: She is alert. Gait normal.  Skin: Skin is warm and dry. She is not diaphoretic.     Assessment/Plan: Please see individual problem list.  Obesity (BMI 30.0-34.9) Discussed dietary changes at length with patient.  Diet instructions given.  Encouraged exercise.  Elevated glucose Elevated on several occasions.  Noted up to 213 about 4 hours after eating.  Strong family history of diabetes.  Some polydipsia though no other  symptoms.  No weight loss.  We will check an A1c.  Determine management after lab work returns.  Trying to get pregnant Patient has been trying to get pregnant since September.  She requests a blood pregnancy test.  This will be ordered.  I discussed her medications with her.  Discussed that there are potential risks with many medications.  Discussed that several of her headache medications have risk if she were to be pregnant and continue to take them.  She takes these as needed as a migraine cocktail.  She noted she would check with her neurologist regarding this as well.  I did discuss Tylenol being an option for pain if she were to become pregnant.  She should avoid NSAIDs.   [place] was seen today for establish care.  Diagnoses and all orders for this visit:  Elevated glucose -     HgB A1c -     Comp Met (CMET) -     Lipid panel  Trying to get pregnant -     B-HCG Quant  Obesity (BMI 30.0-34.9)    Orders Placed This Encounter  Procedures  . HgB A1c  . Comp Met (CMET)  . Lipid panel  . B-HCG Quant    No orders of the defined types were placed in this encounter.    Angela Rumps, MD [place]

## 2017-04-13 NOTE — Assessment & Plan Note (Signed)
Patient has been trying to get pregnant since September.  She requests a blood pregnancy test.  This will be ordered.  I discussed her medications with her.  Discussed that there are potential risks with many medications.  Discussed that several of her headache medications have risk if she were to be pregnant and continue to take them.  She takes these as needed as a migraine cocktail.  She noted she would check with her neurologist regarding this as well.  I did discuss Tylenol being an option for pain if she were to become pregnant.  She should avoid NSAIDs.

## 2017-04-13 NOTE — Patient Instructions (Addendum)
Nice to meet you. We will get lab work today.  We will call you with the results. Please try to continue to exercise and increase your exercise. Please look at the dietary information included below and incorporate changes.  Diet Recommendations  Starchy (carb) foods: Bread, rice, pasta, potatoes, corn, cereal, grits, crackers, bagels, muffins, all baked goods.  (Fruits, milk, and yogurt also have carbohydrate, but most of these foods will not spike your blood sugar as the starchy foods will.)  A few fruits do cause high blood sugars; use small portions of bananas (limit to 1/2 at a time), grapes, watermelon, oranges, and most tropical fruits.    Protein foods: Meat, fish, poultry, eggs, dairy foods, and beans such as pinto and kidney beans (beans also provide carbohydrate).   1. Eat at least 3 meals and 1-2 snacks per day. Never go more than 4-5 hours while awake without eating. Eat breakfast within the first hour of getting up.   2. Limit starchy foods to TWO per meal and ONE per snack. ONE portion of a starchy  food is equal to the following:   - ONE slice of bread (or its equivalent, such as half of a hamburger bun).   - 1/2 cup of a "scoopable" starchy food such as potatoes or rice.   - 15 grams of carbohydrate as shown on food label.  3. Include at every meal: a protein food, a carb food, and vegetables and/or fruit.   - Obtain twice the volume of veg's as protein or carbohydrate foods for both lunch and dinner.   - Fresh or frozen veg's are best.   - Keep frozen veg's on hand for a quick vegetable serving.

## 2017-05-02 ENCOUNTER — Ambulatory Visit (INDEPENDENT_AMBULATORY_CARE_PROVIDER_SITE_OTHER): Payer: BLUE CROSS/BLUE SHIELD | Admitting: Family Medicine

## 2017-05-02 ENCOUNTER — Encounter: Payer: Self-pay | Admitting: Family Medicine

## 2017-05-02 ENCOUNTER — Other Ambulatory Visit: Payer: Self-pay

## 2017-05-02 VITALS — BP 100/60 | HR 77 | Temp 97.7°F | Ht 63.5 in | Wt 196.0 lb

## 2017-05-02 DIAGNOSIS — H6983 Other specified disorders of Eustachian tube, bilateral: Secondary | ICD-10-CM | POA: Diagnosis not present

## 2017-05-02 DIAGNOSIS — J069 Acute upper respiratory infection, unspecified: Secondary | ICD-10-CM | POA: Diagnosis not present

## 2017-05-02 MED ORDER — FLUTICASONE PROPIONATE 50 MCG/ACT NA SUSP: 2.0000 | Freq: Every day | NASAL | 6 refills | Status: DC

## 2017-05-02 NOTE — Progress Notes (Signed)
Dr. Karleen HampshireSpencer T. Somnang Mahan, MD, CAQ Sports Medicine Primary [facility] and Sports Medicine [address] [place], [address] Phone: [phone] Fax: [phone]  05/02/2017  Patient: Angela KettleBrittany M Loewe, MRN: [id number], DOB: 1986-02-13, 31 y.o.  Primary Physician:  Glori LuisSonnenberg, Eric G, MD   Chief Complaint  Patient presents with  . Sore Throat  . Ears Itching    feels like she has cotten balls in ears  . Nasal Congestion  . Cough   Subjective:   This 31 y.o. female patient presents with runny nose, sneezing, cough, sore throat, malaise and minimal / low-grade fever .  ST started yestreday, ears are clogged, nothing tastes right - all started on Monday morning.   ? recent exposure to others with similar symptoms.   The patent denies sore throat as the primary complaint. Denies sthortness of breath/wheezing, high fever, chest pain, rhinits for more than 14 days, significant myalgia, otalgia, facial pain, abdominal pain, changes in bowel or bladder.  PMH, PHS, Allergies, Problem List, Medications, Family History, and Social History have all been reviewed.  Patient Active Problem List   Diagnosis Date Noted  . Elevated glucose 04/13/2017  . Trying to get pregnant 04/13/2017  . Obesity (BMI 30.0-34.9) 04/13/2017  . Nodule of neck 11/01/2016  . Rash 11/01/2016  . Chiari malformation 09/26/2012  . Migraines 09/26/2012  . Syncope 09/26/2012    Past Medical History:  Diagnosis Date  . Asthma   . Depression   . History of Chiari malformation   . History of syncope   . Migraine     Past Surgical History:  Procedure Laterality Date  . NO PAST SURGERIES      Social History   Socioeconomic History  . Marital status: Married    Spouse name: Not on file  . Number of children: Not on file  . Years of education: Not on file  . Highest education level: Not on file  Social Needs  . Financial resource strain: Not on file  . Food insecurity - worry: Not on file  . Food  insecurity - inability: Not on file  . Transportation needs - medical: Not on file  . Transportation needs - non-medical: Not on file  Occupational History  . Not on file  Tobacco Use  . Smoking status: Never Smoker  . Smokeless tobacco: Never Used  Substance and Sexual Activity  . Alcohol use: No    Frequency: Never  . Drug use: No  . Sexual activity: Not on file  Other Topics Concern  . Not on file  Social History Narrative  . Not on file    Family History  Problem Relation Age of Onset  . Colon cancer Mother   . Heart disease Mother   . Diabetes Mother   . Diabetes Father     Allergies  Allergen Reactions  . Sumatriptan Succinate Anxiety    Medication list reviewed and updated in full in [facility].  ROS as above, eating and drinking - tolerating PO. Urinating normally. No excessive vomitting or diarrhea. O/w as above.  Objective:   Blood pressure 100/60, pulse 77, temperature 97.7 F (36.5 C), temperature source Oral, height 5' 3.5" (1.613 m), weight 196 lb (88.9 kg), last menstrual period 04/27/2017.  GEN: WDWN, Non-toxic, Atraumatic, normocephalic. A and O x 3. HEENT: Oropharynx clear without exudate, MMM, no significant LAD, mild rhinnorhea Ears: TM clear, COL visualized with good landmarks CV: RRR, no m/g/r. Pulm: CTA B, no wheezes, rhonchi, or  crackles, normal respiratory effort. EXT: no c/c/e Psych: well oriented, neither depressed nor anxious in appearance  Objective Data:  Assessment and Plan:   Acute URI  ETD (Eustachian tube dysfunction), bilateral  Supportive care reviewed with patient. See patient instruction section.  Follow-up: No Follow-up on file.  Meds ordered this encounter  Medications  . fluticasone (FLONASE) 50 MCG/ACT nasal spray    Sig: Place 2 sprays into both nostrils daily.    Dispense:  16 g    Refill:  6   Patient Instructions  Eustachian Tube Dysfunction: There is a tube that connects between the sinuses and  behind the ear called the "eustachian tube." Sometimes when you have allergies, a cold, or nasal congestion for any reason this tube can get blocked and pressure cannot equalize in your ears. (Like if you swim in deep water) This can also trap fluid behind the ear and give you a full, pressure-like sensation that is uncomfortable, but it is not an ear infection.  Recommendations: Afrin Nasal Spray: 2 sprays twice a day for a maximum of 3-4 days (longer than this and your nose gets addicted, and you have rebound swelling that makes it worse.) Sudafed: Either pseudoephedrine or phenylephrine. As directed on box. (Not is heart problems or high blood pressure) Anti-Histamine: Allegra, Zyrtec, or Claritin. All over the counter now and once a day.  Nasal steroid. Nasacort is over the counter now. About 10 prescription ones exist.  If you develop fever > 100.4, then things can change fluid behind the ear does increase your risk of developing an ear infection.     Signed,  Elpidio GaleaSpencer T. Leighton Brickley, MD     Medication List        Accurate as of 05/02/17  4:48 PM. Always use your most recent med list.          albuterol 108 (90 Base) MCG/ACT inhaler Commonly known as:  PROVENTIL HFA;VENTOLIN HFA Inhale 2 puffs into the lungs every 6 (six) hours as needed for wheezing or shortness of breath.   clotrimazole 1 % cream Commonly known as:  LOTRIMIN Apply 1 application topically 2 (two) times daily.   fluticasone 50 MCG/ACT nasal spray Commonly known as:  FLONASE Place 2 sprays into both nostrils daily.   metoCLOPramide 5 MG tablet Commonly known as:  REGLAN   naproxen 500 MG tablet Commonly known as:  NAPROSYN   traMADol 50 MG tablet Commonly known as:  ULTRAM       Where to Get Your Medications    These medications [facility] to [facility] [address] - [place], [place] - [address]  [address], [place] [address]   Phone:  [phone]   fluticasone 50 MCG/ACT nasal spray

## 2017-05-02 NOTE — Patient Instructions (Signed)
Eustachian Tube Dysfunction: There is a tube that connects between the sinuses and behind the ear called the "eustachian tube." Sometimes when you have allergies, a cold, or nasal congestion for any reason this tube can get blocked and pressure cannot equalize in your ears. (Like if you swim in deep water) This can also trap fluid behind the ear and give you a full, pressure-like sensation that is uncomfortable, but it is not an ear infection.  Recommendations: Afrin Nasal Spray: 2 sprays twice a day for a maximum of 3-4 days (longer than this and your nose gets addicted, and you have rebound swelling that makes it worse.) Sudafed: Either pseudoephedrine or phenylephrine. As directed on box. (Not is heart problems or high blood pressure) Anti-Histamine: Allegra, Zyrtec, or Claritin. All over the counter now and once a day.  Nasal steroid. Nasacort is over the counter now. About 10 prescription ones exist.  If you develop fever > 100.4, then things can change fluid behind the ear does increase your risk of developing an ear infection.  

## 2017-06-19 ENCOUNTER — Ambulatory Visit (INDEPENDENT_AMBULATORY_CARE_PROVIDER_SITE_OTHER): Payer: BLUE CROSS/BLUE SHIELD | Admitting: Obstetrics and Gynecology

## 2017-06-19 ENCOUNTER — Encounter: Payer: Self-pay | Admitting: Obstetrics and Gynecology

## 2017-06-19 VITALS — BP 132/70 | HR 94 | Wt 197.0 lb

## 2017-06-19 DIAGNOSIS — O99210 Obesity complicating pregnancy, unspecified trimester: Secondary | ICD-10-CM | POA: Diagnosis not present

## 2017-06-19 DIAGNOSIS — Z3400 Encounter for supervision of normal first pregnancy, unspecified trimester: Secondary | ICD-10-CM | POA: Diagnosis not present

## 2017-06-19 DIAGNOSIS — Z124 Encounter for screening for malignant neoplasm of cervix: Secondary | ICD-10-CM | POA: Diagnosis not present

## 2017-06-19 DIAGNOSIS — Z3A01 Less than 8 weeks gestation of pregnancy: Secondary | ICD-10-CM

## 2017-06-19 DIAGNOSIS — Z113 Encounter for screening for infections with a predominantly sexual mode of transmission: Secondary | ICD-10-CM

## 2017-06-19 DIAGNOSIS — O0990 Supervision of high risk pregnancy, unspecified, unspecified trimester: Secondary | ICD-10-CM | POA: Insufficient documentation

## 2017-06-19 DIAGNOSIS — Z3201 Encounter for pregnancy test, result positive: Secondary | ICD-10-CM | POA: Diagnosis not present

## 2017-06-19 LAB — OB RESULTS CONSOLE VARICELLA ZOSTER ANTIBODY, IGG: Varicella: NON-IMMUNE/NOT IMMUNE

## 2017-06-19 NOTE — Patient Instructions (Signed)
First Trimester of Pregnancy The first trimester of pregnancy is from week 1 until the end of week 13 (months 1 through 3). During this time, your baby will begin to develop inside you. At 6-8 weeks, the eyes and face are formed, and the heartbeat can be seen on ultrasound. At the end of 12 weeks, all the baby's organs are formed. Prenatal care is all the medical care you receive before the birth of your baby. Make sure you get good prenatal care and follow all of your doctor's instructions. Follow these instructions at home: Medicines  Take over-the-counter and prescription medicines only as told by your doctor. Some medicines are safe and some medicines are not safe during pregnancy.  Take a prenatal vitamin that contains at least 600 micrograms (mcg) of folic acid.  If you have trouble pooping (constipation), take medicine that will make your stool soft (stool softener) if your doctor approves. Eating and drinking  Eat regular, healthy meals.  Your doctor will tell you the amount of weight gain that is right for you.  Avoid raw meat and uncooked cheese.  If you feel sick to your stomach (nauseous) or throw up (vomit): ? Eat 4 or 5 small meals a day instead of 3 large meals. ? Try eating a few soda crackers. ? Drink liquids between meals instead of during meals.  To prevent constipation: ? Eat foods that are high in fiber, like fresh fruits and vegetables, whole grains, and beans. ? Drink enough fluids to keep your pee (urine) clear or pale yellow. Activity  Exercise only as told by your doctor. Stop exercising if you have cramps or pain in your lower belly (abdomen) or low back.  Do not exercise if it is too hot, too humid, or if you are in a place of great height (high altitude).  Try to avoid standing for long periods of time. Move your legs often if you must stand in one place for a long time.  Avoid heavy lifting.  Wear low-heeled shoes. Sit and stand up straight.  You  can have sex unless your doctor tells you not to. Relieving pain and discomfort  Wear a good support bra if your breasts are sore.  Take warm water baths (sitz baths) to soothe pain or discomfort caused by hemorrhoids. Use hemorrhoid cream if your doctor says it is okay.  Rest with your legs raised if you have leg cramps or low back pain.  If you have puffy, bulging veins (varicose veins) in your legs: ? Wear support hose or compression stockings as told by your doctor. ? Raise (elevate) your feet for 15 minutes, 3-4 times a day. ? Limit salt in your food. Prenatal care  Schedule your prenatal visits by the twelfth week of pregnancy.  Write down your questions. Take them to your prenatal visits.  Keep all your prenatal visits as told by your doctor. This is important. Safety  Wear your seat belt at all times when driving.  Make a list of emergency phone numbers. The list should include numbers for family, friends, the hospital, and police and fire departments. General instructions  Ask your doctor for a referral to a local prenatal class. Begin classes no later than at the start of month 6 of your pregnancy.  Ask for help if you need counseling or if you need help with nutrition. Your doctor can give you advice or tell you where to go for help.  Do not use hot tubs, steam rooms, or   saunas.  Do not douche or use tampons or scented sanitary pads.  Do not cross your legs for long periods of time.  Avoid all herbs and alcohol. Avoid drugs that are not approved by your doctor.  Do not use any tobacco products, including cigarettes, chewing tobacco, and electronic cigarettes. If you need help quitting, ask your doctor. You may get counseling or other support to help you quit.  Avoid cat litter boxes and soil used by cats. These carry germs that can cause birth defects in the baby and can cause a loss of your baby (miscarriage) or stillbirth.  Visit your dentist. At home, brush  your teeth with a soft toothbrush. Be gentle when you floss. Contact a doctor if:  You are dizzy.  You have mild cramps or pressure in your lower belly.  You have a nagging pain in your belly area.  You continue to feel sick to your stomach, you throw up, or you have watery poop (diarrhea).  You have a bad smelling fluid coming from your vagina.  You have pain when you pee (urinate).  You have increased puffiness (swelling) in your face, hands, legs, or ankles. Get help right away if:  You have a fever.  You are leaking fluid from your vagina.  You have spotting or bleeding from your vagina.  You have very bad belly cramping or pain.  You gain or lose weight rapidly.  You throw up blood. It may look like coffee grounds.  You are around people who have German measles, fifth disease, or chickenpox.  You have a very bad headache.  You have shortness of breath.  You have any kind of trauma, such as from a fall or a car accident. Summary  The first trimester of pregnancy is from week 1 until the end of week 13 (months 1 through 3).  To take care of yourself and your unborn baby, you will need to eat healthy meals, take medicines only if your doctor tells you to do so, and do activities that are safe for you and your baby.  Keep all follow-up visits as told by your doctor. This is important as your doctor will have to ensure that your baby is healthy and growing well. This information is not intended to replace advice given to you by your health care provider. Make sure you discuss any questions you have with your health care provider. Document Released: 10/18/2007 Document Revised: 05/09/2016 Document Reviewed: 05/09/2016 Elsevier Interactive Patient Education  2017 Elsevier Inc.  

## 2017-06-19 NOTE — Progress Notes (Signed)
NOB Confirmed at [facility] 

## 2017-06-19 NOTE — Progress Notes (Signed)
New Obstetric Patient H&P    Chief Complaint: "Desires prenatal care"   History of Present Illness: Patient is a 32 y.o. G1P0000 Not Hispanic or Latino female, Patient's last menstrual period was 04/28/2017 (exact date). presents with amenorrhea and positive home pregnancy test. Based on her  LMP, her EDD is Estimated Date of Delivery: 02/02/18 and her EGA is [redacted]w[redacted]d. Cycles are regular monthly.    She had a urine pregnancy test which was positive 2 week(s)  ago.  Since her LMP she claims she has experienced some nausea, breast tenderness, and fatigue. She denies vaginal bleeding. Her past medical history is notable for Chiari type II malformation (occasional headaches and restless leg) . This is her first pregnancy.  Since her LMP, she admits to the use of tobacco products  no She claims she has gained   no pounds since the start of her pregnancy.  There are cats in the home in the home  no  She admits close contact with children on a regular basis  no  She has had chicken pox in the past yes She has had Tuberculosis exposures, symptoms, or previously tested positive for TB   no Current or past history of domestic violence. no  Genetic Screening/Teratology Counseling: (Includes patient, baby's father, or anyone in either family with:)   1. Patient's age >/= 4 at [facility]  no 2. Thalassemia ([place], [place], Mediterranean, or Asian background): MCV<80  no 3. Neural tube defect (meningomyelocele, spina bifida, anencephaly)  no 4. Congenital heart defect  no  5. Down syndrome  no 6. Tay-Sachs (Jewish, [place]  no 7. Canavan's Disease  no 8. Sickle cell disease or trait (African)  no  9. Hemophilia or other blood disorders  no  10. Muscular dystrophy  no  11. Cystic fibrosis  no  12. Huntington's Chorea  no  13. Mental retardation/autism  no 14. Other inherited genetic or chromosomal disorder  no 15. Maternal metabolic disorder (DM, PKU, etc)  no 16. Patient or FOB with a child  with a birth defect not listed above no  16a. Patient or FOB with a birth defect themselves no 17. Recurrent pregnancy loss, or stillbirth  no  18. Any medications since LMP other than prenatal vitamins (include vitamins, supplements, OTC meds, drugs, alcohol)  no 19. Any other genetic/environmental exposure to discuss  no  Infection History:   1. Lives with someone with TB or TB exposed  no  2. Patient or partner has history of genital herpes  no 3. Rash or viral illness since LMP  no 4. History of STI (GC, CT, HPV, syphilis, HIV)  no 5. History of recent travel :  no  Other pertinent information:  no     Review of Systems:10 point review of systems negative unless otherwise noted in HPI  Past Medical History:  Past Medical History:  Diagnosis Date  . Asthma   . Depression   . History of Chiari malformation   . History of syncope   . Migraine     Past Surgical History:  Past Surgical History:  Procedure Laterality Date  . NO PAST SURGERIES      Gynecologic History: Patient's last menstrual period was 04/28/2017 (exact date).  Obstetric History: G1P0000  Family History:  Family History  Problem Relation Age of Onset  . Colon cancer Mother   . Heart disease Mother   . Diabetes Mother   . Diabetes Father     Social History:  Social History  Socioeconomic History  . Marital status: Married    Spouse name: Not on file  . Number of children: Not on file  . Years of education: Not on file  . Highest education level: Not on file  Social Needs  . Financial resource strain: Not on file  . Food insecurity - worry: Not on file  . Food insecurity - inability: Not on file  . Transportation needs - medical: Not on file  . Transportation needs - non-medical: Not on file  Occupational History  . Not on file  Tobacco Use  . Smoking status: Never Smoker  . Smokeless tobacco: Never Used  Substance and Sexual Activity  . Alcohol use: No    Frequency: Never  . Drug  use: No  . Sexual activity: Yes    Birth control/protection: None  Other Topics Concern  . Not on file  Social History Narrative  . Not on file    Allergies:  Allergies  Allergen Reactions  . Sumatriptan Succinate Anxiety    Medications: Prior to Admission medications   Medication Sig Start Date End Date Taking? Authorizing Provider  albuterol (PROVENTIL HFA;VENTOLIN HFA) 108 (90 Base) MCG/ACT inhaler Inhale 2 puffs into the lungs every 6 (six) hours as needed for wheezing or shortness of breath. 10/31/16  Yes Tommie Samsook, Jayce G, DO  prenatal vitamin w/FE, FA (NATACHEW) 29-1 MG CHEW chewable tablet Chew 1 tablet by mouth daily at 12 noon.   Yes [provider]    Physical Exam Vitals: Blood pressure 132/70, pulse 94, weight 197 lb (89.4 kg), last menstrual period 04/28/2017. Body mass index is 34.35 kg/m.  General: NAD HEENT: normocephalic, anicteric Thyroid: no enlargement, no palpable nodules Pulmonary: No increased work of breathing, CTAB Cardiovascular: RRR, distal pulses 2+ Abdomen: NABS, soft, non-tender, non-distended.  Umbilicus without lesions.  No hepatomegaly, splenomegaly or masses palpable. No evidence of hernia  Genitourinary:  External: Normal external female genitalia.  Normal urethral meatus, normal  Bartholin's and Skene's glands.    Vagina: Normal vaginal mucosa, no evidence of prolapse.    Cervix: Grossly normal in appearance, no bleeding  Uterus:  Non-enlarged, mobile, normal contour.  No CMT  Adnexa: ovaries non-enlarged, no adnexal masses  Rectal: deferred Extremities: no edema, erythema, or tenderness Neurologic: Grossly intact Psychiatric: mood appropriate, affect full   Assessment: 32 y.o. G1P0000 at 4184w3d presenting to initiate prenatal care  Plan: 1) Avoid alcoholic beverages. 2) Patient encouraged not to smoke.  3) Discontinue the use of all non-medicinal drugs and chemicals.  4) Take prenatal vitamins daily.  5) Nutrition, food  safety (fish, cheese advisories, and high nitrite foods) and exercise discussed. 6) Hospital and practice style discussed with cross coverage system.  7) Genetic Screening, such as with 1st Trimester Screening, cell free fetal DNA, AFP testing, and Ultrasound, as well as with amniocentesis and CVS as appropriate, is discussed with patient. At the conclusion of today's visit patient undecided genetic testing 8) Patient is asked about travel to areas at risk for the [place] virus, and counseled to avoid travel and exposure to mosquitoes or sexual partners who may have themselves been exposed to the virus. Testing is discussed, and will be ordered as appropriate. 9) Chiari type II - should be fine to delivery vaginally and have epidural anesthesia.  [facility] and I asked her to ask whether he would have any concern regarding mode of delivery or regional anesthesia in her particular case.  I will also refer the patient to [facility] and recommended an anesthesia consult in the third trimester

## 2017-06-20 LAB — RPR+RH+ABO+RUB AB+AB SCR+CB...
Antibody Screen: NEGATIVE
HIV Screen 4th Generation wRfx: NONREACTIVE
Hematocrit: 39.6 % (ref 34.0–46.6)
Hemoglobin: 12.7 g/dL (ref 11.1–15.9)
Hepatitis B Surface Ag: NEGATIVE
MCH: 28 pg (ref 26.6–33.0)
MCHC: 32.1 g/dL (ref 31.5–35.7)
MCV: 87 fL (ref 79–97)
Platelets: 345 10*3/uL (ref 150–379)
RBC: 4.53 x10E6/uL (ref 3.77–5.28)
RDW: 14 % (ref 12.3–15.4)
RPR Ser Ql: NONREACTIVE
Rh Factor: POSITIVE
Rubella Antibodies, IGG: 1.38 index (ref >0.99)
Varicella zoster IgG: 999 index (ref >165)
WBC: 7.6 10*3/uL (ref 3.4–10.8)

## 2017-06-20 LAB — HEMOGLOBIN A1C
Est. average glucose Bld gHb Est-mCnc: 103 mg/dL
Hgb A1c MFr Bld: 5.2 % (ref 4.8–5.6)

## 2017-06-20 LAB — URINE CULTURE

## 2017-06-22 LAB — PAPIG, CTNGTV, HPV, RFX 16/18
Chlamydia, Nuc. Acid Amp: NEGATIVE
Gonococcus, Nuc. Acid Amp: NEGATIVE
HPV, high-risk: NEGATIVE
PAP Smear Comment: 0
Trich vag by NAA: NEGATIVE

## 2017-06-26 ENCOUNTER — Ambulatory Visit (INDEPENDENT_AMBULATORY_CARE_PROVIDER_SITE_OTHER): Payer: BLUE CROSS/BLUE SHIELD | Admitting: Maternal Newborn

## 2017-06-26 ENCOUNTER — Encounter: Payer: Self-pay | Admitting: Maternal Newborn

## 2017-06-26 ENCOUNTER — Encounter: Payer: Self-pay | Admitting: Obstetrics and Gynecology

## 2017-06-26 ENCOUNTER — Ambulatory Visit (INDEPENDENT_AMBULATORY_CARE_PROVIDER_SITE_OTHER): Payer: BLUE CROSS/BLUE SHIELD

## 2017-06-26 ENCOUNTER — Other Ambulatory Visit: Payer: Self-pay | Admitting: Obstetrics and Gynecology

## 2017-06-26 VITALS — BP 110/70 | Wt 193.0 lb

## 2017-06-26 DIAGNOSIS — Z3A08 8 weeks gestation of pregnancy: Secondary | ICD-10-CM

## 2017-06-26 DIAGNOSIS — O0991 Supervision of high risk pregnancy, unspecified, first trimester: Secondary | ICD-10-CM

## 2017-06-26 DIAGNOSIS — O99210 Obesity complicating pregnancy, unspecified trimester: Secondary | ICD-10-CM

## 2017-06-26 DIAGNOSIS — Z3400 Encounter for supervision of normal first pregnancy, unspecified trimester: Secondary | ICD-10-CM

## 2017-06-26 DIAGNOSIS — Z3A01 Less than 8 weeks gestation of pregnancy: Secondary | ICD-10-CM

## 2017-06-26 NOTE — Progress Notes (Signed)
No concerns.rj 

## 2017-06-26 NOTE — Progress Notes (Signed)
    Routine Prenatal Care Visit  Subjective  Angela Mccormick is a 32 y.o. G1P0000 at 513w3d being seen today for ongoing prenatal care.  She is currently monitored for the following issues for this high-risk pregnancy and has Chiari malformation; Migraines; Syncope; Nodule of neck; Rash; Elevated glucose; Obesity (BMI 30.0-34.9); Supervision of high risk pregnancy in first trimester; and Obesity affecting pregnancy, antepartum on their problem list.  ----------------------------------------------------------------------------------- Patient reports no complaints.   Vag. Bleeding: None.  ----------------------------------------------------------------------------------- The following portions of the patient's history were reviewed and updated as appropriate: allergies, current medications, past family history, past medical history, past social history, past surgical history and problem list. Problem list updated.   Objective  Blood pressure 110/70, weight 193 lb (87.5 kg), last menstrual period 04/28/2017. Pregravid weight 197 lb (89.4 kg) Total Weight Gain  (-1.814 kg) Urinalysis: Urine Protein: Negative Urine Glucose: Negative  Fetal Status: Fetal Heart Rate (bpm): Present  (174 on ultrasound)  General:  Alert, oriented and cooperative. Patient is in no acute distress.  Skin: Skin is warm and dry. No rash noted.   Cardiovascular: Normal heart rate noted  Respiratory: Normal respiratory effort, no problems with respiration noted  Abdomen: Soft, gravid, appropriate for gestational age.       Pelvic:  Cervical exam deferred        Extremities: Normal range of motion.     Mental Status: Normal mood and affect. Normal behavior. Normal judgment and thought content.     Assessment   32 y.o. G1P0000 at 4413w3d, EDD 02/02/2018 by Last Menstrual Period presenting for routine prenatal visit.  Plan   Pregnancy#1 Problems (from 04/28/17 to present)    Problem Noted Resolved   Supervision of  normal first pregnancy, antepartum 06/19/2017 by [name], Andreas, MD No   Overview Addendum 06/26/2017 10:07 AM by [name], Andreas, MD    Clinic [facility] Prenatal Labs  Dating  Blood type: A pos   Genetic Screen 1 Screen:    AFP:     Quad:     NIPS: Antibody: negative  Anatomic US  Rubella: Immune Varicella: Immune  GTT HgbA1C 5.2  Third trimester:  RPR: NR  Rhogam  HBsAg: Negative  TDaP vaccine                       Flu Shot: HIV: Negative  Baby Food                                GBS:   Contraception  Pap: NIL HPV negative 06/19/17  CBB     CS/VBAC    Support Person             Ultrasound today showed single IUP, size=dates.    Preterm labor symptoms and general obstetric precautions including but not limited to vaginal bleeding, were reviewed in detail with the patient.  Return in about 4 weeks (around 07/24/2017) for ROB.  Marcelyn BruinsJacelyn Schmid, CNM 06/26/2017  11:24 AM

## 2017-06-28 ENCOUNTER — Encounter: Payer: Self-pay | Admitting: Obstetrics and Gynecology

## 2017-07-13 ENCOUNTER — Ambulatory Visit: Payer: BLUE CROSS/BLUE SHIELD | Admitting: Family Medicine

## 2017-07-24 ENCOUNTER — Ambulatory Visit (INDEPENDENT_AMBULATORY_CARE_PROVIDER_SITE_OTHER): Payer: BLUE CROSS/BLUE SHIELD | Admitting: Obstetrics and Gynecology

## 2017-07-24 ENCOUNTER — Encounter: Payer: Self-pay | Admitting: Obstetrics and Gynecology

## 2017-07-24 VITALS — BP 118/74 | Wt 200.0 lb

## 2017-07-24 DIAGNOSIS — O0991 Supervision of high risk pregnancy, unspecified, first trimester: Secondary | ICD-10-CM

## 2017-07-24 DIAGNOSIS — O99210 Obesity complicating pregnancy, unspecified trimester: Secondary | ICD-10-CM

## 2017-07-24 DIAGNOSIS — Z3A12 12 weeks gestation of pregnancy: Secondary | ICD-10-CM

## 2017-07-24 DIAGNOSIS — Q0701 Arnold-Chiari syndrome with spina bifida: Secondary | ICD-10-CM

## 2017-07-24 DIAGNOSIS — Q0700 Arnold-Chiari syndrome without spina bifida or hydrocephalus: Secondary | ICD-10-CM

## 2017-07-24 NOTE — Progress Notes (Signed)
  Routine Prenatal Care Visit  Subjective  Angela Mccormick is a 32 y.o. G1P0000 at 4279w3d being seen today for ongoing prenatal care.  She is currently monitored for the following issues for this high-risk pregnancy and has Chiari malformation type II (HCC); Migraines; Syncope; Nodule of neck; Rash; Elevated glucose; Obesity (BMI 30.0-34.9); Supervision of high risk pregnancy in first trimester; and Obesity affecting pregnancy, antepartum on their problem list.  ----------------------------------------------------------------------------------- Patient reports no complaints.    . Vag. Bleeding: None.   . Denies leaking of fluid.  ----------------------------------------------------------------------------------- The following portions of the patient's history were reviewed and updated as appropriate: allergies, current medications, past family history, past medical history, past social history, past surgical history and problem list. Problem list updated.   Objective  Blood pressure 118/74, weight 200 lb (90.7 kg), last menstrual period 04/28/2017. Pregravid weight 197 lb (89.4 kg) Total Weight Gain 3 lb (1.361 kg) Urinalysis: Urine Protein: Negative Urine Glucose: Negative  Fetal Status: Fetal Heart Rate (bpm): Present         General:  Alert, oriented and cooperative. Patient is in no acute distress.  Skin: Skin is warm and dry. No rash noted.   Cardiovascular: Normal heart rate noted  Respiratory: Normal respiratory effort, no problems with respiration noted  Abdomen: Soft, gravid, appropriate for gestational age. Pain/Pressure: Absent     Pelvic:  Cervical exam deferred        Extremities: Normal range of motion.     Mental Status: Normal mood and affect. Normal behavior. Normal judgment and thought content.   Assessment   32 y.o. G1P0000 at 6879w3d by  02/02/2018, by Last Menstrual Period presenting for routine prenatal visit  Plan   Pregnancy#1 Problems (from 04/28/17 to  present)    Problem Noted Resolved   Supervision of high risk pregnancy in first trimester 06/19/2017 by [name], Andreas, MD No   Overview Addendum 06/26/2017 11:28 AM by Oswaldo ConroySchmid, Jacelyn Y, CNM    Clinic [facility] Prenatal Labs  Dating L=8 Blood type: A pos   Genetic Screen 1 Screen:    AFP:     Quad:     NIPS: Antibody: negative  Anatomic US  Rubella: Immune Varicella: Immune  GTT HgbA1C 5.2  Third trimester:  RPR: NR  Rhogam  HBsAg: Negative  TDaP vaccine                       Flu Shot: HIV: Negative  Baby Food                                GBS:   Contraception  Pap: NIL HPV negative 06/19/17  CBB     CS/VBAC    Support Person              Please refer to After Visit Summary for other counseling recommendations.   Return in about 4 weeks (around 08/21/2017) for Routine Prenatal Appointment.  -referral to [facility] made - Declines all genetic screening  Thomasene MohairStephen Jackson, MD, Merlinda FrederickFACOG [facility], [facility] 07/24/2017 10:03 AM

## 2017-07-26 ENCOUNTER — Telehealth: Payer: Self-pay

## 2017-07-26 MED ORDER — OSELTAMIVIR PHOSPHATE 75 MG PO CAPS: 75.0000 mg | ORAL_CAPSULE | Freq: Every day | ORAL | 0 refills | Status: DC

## 2017-07-26 NOTE — Addendum Note (Signed)
Addended by: Birdie SonsSONNENBERG, [place] Sylvan G on: 07/26/2017 12:39 PM   Modules accepted: Orders

## 2017-07-26 NOTE — Telephone Encounter (Signed)
Copied from CRM [phone]. Topic: General - Other >> Jul 26, 2017  9:10 AM Gerrianne ScalePayne, Angela L wrote: Reason for CRM: Patient calling to let Dr Birdie SonsSonnenberg know that she has an appt tomorrow with him but her husband was tested positive for FluA today and that she wanted a RX for herself because she is pregnant

## 2017-07-26 NOTE — Telephone Encounter (Signed)
Patient notified and verbalized understanding. 

## 2017-07-26 NOTE — Telephone Encounter (Signed)
Please advise 

## 2017-07-26 NOTE — Telephone Encounter (Signed)
Noted.  I think it would be reasonable to treat prophylactically though she should be aware that there are risks of psychiatric issues with the Tamiflu with potential for causing psychosis.  This is a very small risk that could happen.  I will send in Tamiflu for her to start on prophylactically and if she has any psychiatric issues she needs to discontinue it and be evaluated. Uptodate reviewed and tamiflu is indicated for prophylaxis in pregnancy.

## 2017-07-27 ENCOUNTER — Other Ambulatory Visit: Payer: Self-pay

## 2017-07-27 ENCOUNTER — Encounter: Payer: Self-pay | Admitting: Family Medicine

## 2017-07-27 ENCOUNTER — Ambulatory Visit (INDEPENDENT_AMBULATORY_CARE_PROVIDER_SITE_OTHER): Payer: BLUE CROSS/BLUE SHIELD | Admitting: Family Medicine

## 2017-07-27 DIAGNOSIS — J111 Influenza due to unidentified influenza virus with other respiratory manifestations: Secondary | ICD-10-CM | POA: Diagnosis not present

## 2017-07-27 NOTE — Patient Instructions (Signed)
Nice to see you. Please start taking the Tamiflu 75 mg twice daily by mouth for treatment of the flu. You can take Tylenol for any discomfort. Please contact your OB to inform them of your influenza diagnosis and treatment.  If you develop worsening headaches, numbness, weakness, vision changes, vomiting of blood, blood in your stool, abdominal pain, vaginal bleeding, loss of fluid, trouble breathing, or any new or changing symptoms please seek medical attention immediately.

## 2017-07-27 NOTE — Progress Notes (Signed)
  Angela AlarEric Diann Bangerter, MD Phone: [phone]  Angela PallBrittany M Maisie Mccormick is a 32 y.o. female who presents today for follow-up.  Patient notes 2 days ago she woke up and started to feel nauseous and had some vomiting.  She vomits up what ever she has been eating.  She notes she tried to go to work though was unable.  She has subsequently developed some postnasal drip, sinus congestion, sneezing, and cough.  She notes her throat does bother her when she swallows.  She has not had to use her albuterol.  She notes no diarrhea, abdominal pain, loss of fluid, or vaginal bleeding.  She does note her husband tested positive for influenza A.  She was started on prophylactic Tamiflu yesterday.  She is trying to stay well-hydrated.  Social History   Tobacco Use  Smoking Status Never Smoker  Smokeless Tobacco Never Used     ROS see history of present illness  Objective  Physical Exam Vitals:   07/27/17 0908  BP: 100/62  Pulse: 81  Temp: 98.4 F (36.9 C)  SpO2: 97%    BP Readings from Last 3 Encounters:  07/27/17 100/62  07/24/17 118/74  06/26/17 110/70   Wt Readings from Last 3 Encounters:  07/27/17 194 lb 3.2 oz (88.1 kg)  07/24/17 200 lb (90.7 kg)  06/26/17 193 lb (87.5 kg)    Physical Exam  Constitutional: No distress.  HENT:  Head: Normocephalic and atraumatic.  Mouth/Throat: No oropharyngeal exudate.  Mild posterior oropharyngeal erythema  Eyes: Conjunctivae are normal. Pupils are equal, round, and reactive to light.  Cardiovascular: Normal rate, regular rhythm and normal heart sounds.  Pulmonary/Chest: Effort normal and breath sounds normal.  Abdominal: Soft. Bowel sounds are normal. She exhibits no distension. There is no tenderness. There is no rebound and no guarding.  Musculoskeletal: She exhibits no edema.  Neurological: She is alert. Gait normal.  CN 2-12 intact, 5/5 strength in bilateral biceps, triceps, grip, quads, hamstrings, plantar and dorsiflexion, sensation to light  touch intact in bilateral UE and LE  Skin: Skin is warm and dry. She is not diaphoretic.     Assessment/Plan: Please see individual problem list.  Influenza Patient pregnant at [redacted] weeks and 6 days with influenza A exposure and clinically with influenza symptoms.  She started on prophylactic Tamiflu yesterday.  We will have her switch this to twice daily dosing for treatment of influenza.  Flu test was deferred as it would not change my management given her symptoms and flu exposure.  I suspect her headaches are related to the flu and sinus congestion.  She is neurologically intact.  She has a benign abdominal exam with no abdominal pain.  Discussed monitoring her symptoms.  Tylenol and salt water gargles for sore throat.  She will contact her OB to update them on her symptoms.  I will forward my note to Dr. Jean Mccormick who she saw most recently.  Given return precautions.  No orders of the defined types were placed in this encounter.   No orders of the defined types were placed in this encounter.    Angela AlarEric Angela Guaman, MD [facility]

## 2017-07-27 NOTE — Assessment & Plan Note (Signed)
Patient pregnant at [redacted] weeks and 6 days with influenza A exposure and clinically with influenza symptoms.  She started on prophylactic Tamiflu yesterday.  We will have her switch this to twice daily dosing for treatment of influenza.  Flu test was deferred as it would not change my management given her symptoms and flu exposure.  I suspect her headaches are related to the flu and sinus congestion.  She is neurologically intact.  She has a benign abdominal exam with no abdominal pain.  Discussed monitoring her symptoms.  Tylenol and salt water gargles for sore throat.  She will contact her OB to update them on her symptoms.  I will forward my note to Dr. Jean RosenthalJackson who she saw most recently.  Given return precautions.

## 2017-08-02 ENCOUNTER — Ambulatory Visit
Admission: RE | Admit: 2017-08-02 | Discharge: 2017-08-02 | Disposition: A | Discharge status: Home / Self Care | Payer: BLUE CROSS/BLUE SHIELD | Source: Ambulatory Visit | Attending: Maternal & Fetal Medicine | Admitting: Maternal & Fetal Medicine

## 2017-08-02 DIAGNOSIS — O0991 Supervision of high risk pregnancy, unspecified, first trimester: Secondary | ICD-10-CM

## 2017-08-02 NOTE — Progress Notes (Signed)
MFM Consultation  32yo G1 at 13 weeks 4 days (EDD 02/02/18 by LMP c/w ultrasound at [facility] on 06/26/17) referred for consultation due to Chiari 1 malformation. She reports being diagnosed at age 89 when her mom noticed ataxia. She also reports history of migraines (controlled on reglan/naproxen/tramadol prn). Her last MRI was in 2014 and demonstrated, "Chiari 1 malformation with 6.445mm displacement of the cerebellar tonsils". She is scheduled to see her neurologist 09/05/17.    Past Medical History:  Diagnosis Date  . Asthma   . Depression   . History of Chiari malformation   . History of syncope   . Migraine   Asthma mild---related to changes in seasons.   Past Surgical History:  Procedure Laterality Date  . NO PAST SURGERIES      OB History    Gravida  1   Para  0   Term  0   Preterm      AB      Living  0     SAB      TAB      Ectopic      Multiple      Live Births             Current Outpatient Medications on File Prior to Encounter  Medication Sig Dispense Refill  . albuterol (PROVENTIL HFA;VENTOLIN HFA) 108 (90 Base) MCG/ACT inhaler Inhale 2 puffs into the lungs every 6 (six) hours as needed for wheezing or shortness of breath. 18 g 1  . prenatal vitamin w/FE, FA (NATACHEW) 29-1 MG CHEW chewable tablet Chew 1 tablet by mouth daily at 12 noon.     No current facility-administered medications on file prior to encounter.    Allergies  Allergen Reactions  . Sumatriptan Succinate Anxiety   Topiramate 'makes head feel funny'--she is unable to describe reaction but states she can only lie down and rest after event  Family History  Problem Relation Age of Onset  . Colon cancer Mother   . Heart disease Mother   . Diabetes Mother   . Diabetes Father   no family history of birth defects  Social history  She is currently not working but previously worked for [employer]. She denies tobacco, alcohol or ilicit drug use. Her husband uses e cigarettes and smoks.    BSUS: single fetus, fhr 160s  Impression/Plan 32 yo G1 at 13 weeks 4 days with history of Chiari 1 malformation. Chiari 1 malformations involve descent of the cerebellar tonsils below the foramen magnum greater than 5mm and maybe associated with syringomelia.  They may affect .6% of the population and are, therefore, quite common.  Most cases are asymptomatic, however, patients may experience headaches or other symptoms associated with valsalva or position pain.  Patients may develop signs of increased intracranial pressure (paoilledema, confusion, disconjugate gaze)   A large series (63 patients with 95 pregnancies from [place] of women with Chiari 1 malformations demonstrated no new or worsening neurologic complications related to epidural or spinal anesthesia. Most of the patients delivered vaginally.   (Waters et al.  Management of Anesthesia and Delivery in Women with Chiari 1 Malformations.  Obstetrics and Gynecology, 03/2017)   Recommendations: --delivery mode should be based on obstetric indications --women with headache as the only manifestation of their malformation can be delivered based on obstetric manifestations --women with Chiaria 1 malformations and hydrocephalus (with or without headache) are considered high risk for vaginal delivery and neuraxial anesthesia and care should be coordinated  with neurosurgeons/neurologist, anesthesiology, obstetricians/mfm teams.    Recommendations 1. Detailed fetal anatomic survey (scheduled) .  2. Follow up with neurology planned for next month. We would also recommend a follow up in the third trimester (she was advised to schedule this appointment at the time of her next follow up. -- If there are concerns for increased intracranial pressure or ability to valsalva, cesarean delivery would be recommended.  3. We addressed our general practice to offer early epidural anesthetic and assisted second stage of labor (with vacuum or  forceps) to avoid prolonged valsalva.  -- If her neurology evaluation demonstrates new or worsening findings that would prevent this approach, we can review with her in a follow up visit.  4.  Anesthesiology appointment in second trimester (~ 28 weeks) in order for them to review her history and plan anesthetic management. We discussed the possible need for her to transfer to a tertiary care setting for delivery.  5. We have scheduled her for a follow up MFM consultation and detailed anatomic survey at ~18 weeks.  We will review her neurology consultation and confirm any changes in recommended delivery mode.   --We have scheduled her detailed anatomic survey for the same visit.  --She is aware she may need to transfer care--this could be accomplished later in pregnancy (~28w), for patient convenience--for delivery in a tertiary care setting should she experience worsening neurologic symptoms.

## 2017-08-09 ENCOUNTER — Encounter: Payer: Self-pay | Admitting: Obstetrics and Gynecology

## 2017-08-13 ENCOUNTER — Other Ambulatory Visit: Payer: Self-pay | Admitting: Obstetrics and Gynecology

## 2017-08-13 ENCOUNTER — Encounter: Payer: Self-pay | Admitting: Obstetrics and Gynecology

## 2017-08-13 DIAGNOSIS — O219 Vomiting of pregnancy, unspecified: Secondary | ICD-10-CM | POA: Insufficient documentation

## 2017-08-13 MED ORDER — DOXYLAMINE-PYRIDOXINE ER 20-20 MG PO TBCR: 1.0000 | EXTENDED_RELEASE_TABLET | Freq: Two times a day (BID) | ORAL | 2 refills | Status: DC

## 2017-08-21 ENCOUNTER — Ambulatory Visit (INDEPENDENT_AMBULATORY_CARE_PROVIDER_SITE_OTHER): Payer: BLUE CROSS/BLUE SHIELD | Admitting: Maternal Newborn

## 2017-08-21 ENCOUNTER — Encounter: Payer: Self-pay | Admitting: Obstetrics and Gynecology

## 2017-08-21 ENCOUNTER — Encounter: Payer: Self-pay | Admitting: Maternal Newborn

## 2017-08-21 VITALS — BP 110/70 | Wt 204.0 lb

## 2017-08-21 DIAGNOSIS — O0991 Supervision of high risk pregnancy, unspecified, first trimester: Secondary | ICD-10-CM

## 2017-08-21 DIAGNOSIS — Z3689 Encounter for other specified antenatal screening: Secondary | ICD-10-CM

## 2017-08-21 DIAGNOSIS — Z3A16 16 weeks gestation of pregnancy: Secondary | ICD-10-CM

## 2017-08-21 NOTE — Progress Notes (Signed)
No concerns.rj 

## 2017-08-21 NOTE — Patient Instructions (Signed)

## 2017-08-21 NOTE — Progress Notes (Signed)
    Routine Prenatal Care Visit  Subjective  Angela Mccormick is a 32 y.o. G1P0000 at 3756w3d being seen today for ongoing prenatal care.  She is currently monitored for the following issues for this high-risk pregnancy and has Chiari malformation type II (HCC); Migraines; Syncope; Nodule of neck; Rash; Elevated glucose; Obesity (BMI 30.0-34.9); Supervision of high risk pregnancy in first trimester; Obesity affecting pregnancy, antepartum; Influenza; and Nausea and vomiting during pregnancy on their problem list.  ----------------------------------------------------------------------------------- Patient reports no complaints.    Vag. Bleeding: None.   ----------------------------------------------------------------------------------- The following portions of the patient's history were reviewed and updated as appropriate: allergies, current medications, past family history, past medical history, past social history, past surgical history and problem list. Problem list updated.  Objective  Blood pressure 110/70, weight 204 lb (92.5 kg), last menstrual period 04/28/2017. Pregravid weight 197 lb (89.4 kg) Total Weight Gain 7 lb (3.175 kg) Urinalysis: Urine Protein: Negative Urine Glucose: Negative  Fetal Status: Fetal Heart Rate (bpm): 152         General:  Alert, oriented and cooperative. Patient is in no acute distress.  Skin: Skin is warm and dry. No rash noted.   Cardiovascular: Normal heart rate noted  Respiratory: Normal respiratory effort, no problems with respiration noted  Abdomen: Soft, gravid, appropriate for gestational age. Pain/Pressure: Absent     Pelvic:  Cervical exam deferred        Extremities: Normal range of motion.     Mental Status: Normal mood and affect. Normal behavior. Normal judgment and thought content.     Assessment   32 y.o. G1P0000 at 256w3d, EDD 02/02/2018 by Last Menstrual Period presenting for routine prenatal visit.  Plan   Pregnancy#1 Problems  (from 04/28/17 to present)    Problem Noted Resolved   Nausea and vomiting during pregnancy 08/13/2017 by Conard NovakJackson, Stephen D, MD No   Supervision of high risk pregnancy in first trimester 06/19/2017 by [name], Andreas, MD No   Overview Addendum 06/26/2017 11:28 AM by Oswaldo ConroySchmid, Patsey Pitstick Y, CNM    Clinic [facility] Prenatal Labs  Dating L=8 Blood type: A pos   Genetic Screen 1 Screen:    AFP:     Quad:     NIPS: Antibody: negative  Anatomic US  Rubella: Immune Varicella: Immune  GTT HgbA1C 5.2  Third trimester:  RPR: NR  Rhogam  HBsAg: Negative  TDaP vaccine                       Flu Shot: HIV: Negative  Baby Food                                GBS:   Contraception  Pap: NIL HPV negative 06/19/17  CBB     CS/VBAC    Support Person              Anatomy scan next visit.  Gestational age obstetric precautions were reviewed. Please refer to After Visit Summary for other counseling recommendations.   Return in about 1 month (around 09/18/2017) for ROB and anatomy scan.  Marcelyn BruinsJacelyn Erryn Dickison, CNM 08/21/2017  10:33 AM

## 2017-09-05 DIAGNOSIS — G935 Compression of brain: Secondary | ICD-10-CM | POA: Diagnosis not present

## 2017-09-05 DIAGNOSIS — G43009 Migraine without aura, not intractable, without status migrainosus: Secondary | ICD-10-CM | POA: Diagnosis not present

## 2017-09-05 DIAGNOSIS — Z6836 Body mass index (BMI) 36.0-36.9, adult: Secondary | ICD-10-CM | POA: Diagnosis not present

## 2017-09-10 ENCOUNTER — Other Ambulatory Visit: Payer: Self-pay | Admitting: *Deleted

## 2017-09-10 DIAGNOSIS — O0991 Supervision of high risk pregnancy, unspecified, first trimester: Secondary | ICD-10-CM

## 2017-09-10 DIAGNOSIS — Q0701 Arnold-Chiari syndrome with spina bifida: Secondary | ICD-10-CM

## 2017-09-13 ENCOUNTER — Ambulatory Visit: Payer: BLUE CROSS/BLUE SHIELD

## 2017-09-13 ENCOUNTER — Ambulatory Visit
Admission: RE | Admit: 2017-09-13 | Discharge: 2017-09-13 | Disposition: A | Discharge status: Home / Self Care | Payer: BLUE CROSS/BLUE SHIELD | Source: Ambulatory Visit | Attending: Obstetrics and Gynecology | Admitting: Obstetrics and Gynecology

## 2017-09-13 VITALS — BP 136/73 | HR 106 | Temp 97.4°F | Resp 18 | Wt 203.2 lb

## 2017-09-13 DIAGNOSIS — Z888 Allergy status to other drugs, medicaments and biological substances status: Secondary | ICD-10-CM | POA: Diagnosis not present

## 2017-09-13 DIAGNOSIS — Q0701 Arnold-Chiari syndrome with spina bifida: Secondary | ICD-10-CM

## 2017-09-13 DIAGNOSIS — Z800 Family history of malignant neoplasm of digestive organs: Secondary | ICD-10-CM | POA: Diagnosis not present

## 2017-09-13 DIAGNOSIS — R55 Syncope and collapse: Secondary | ICD-10-CM | POA: Diagnosis not present

## 2017-09-13 DIAGNOSIS — O99212 Obesity complicating pregnancy, second trimester: Secondary | ICD-10-CM | POA: Diagnosis not present

## 2017-09-13 DIAGNOSIS — E669 Obesity, unspecified: Secondary | ICD-10-CM

## 2017-09-13 DIAGNOSIS — J45909 Unspecified asthma, uncomplicated: Secondary | ICD-10-CM | POA: Insufficient documentation

## 2017-09-13 DIAGNOSIS — F329 Major depressive disorder, single episode, unspecified: Secondary | ICD-10-CM | POA: Insufficient documentation

## 2017-09-13 DIAGNOSIS — O26892 Other specified pregnancy related conditions, second trimester: Secondary | ICD-10-CM | POA: Diagnosis not present

## 2017-09-13 DIAGNOSIS — O99512 Diseases of the respiratory system complicating pregnancy, second trimester: Secondary | ICD-10-CM | POA: Diagnosis not present

## 2017-09-13 DIAGNOSIS — O99210 Obesity complicating pregnancy, unspecified trimester: Secondary | ICD-10-CM

## 2017-09-13 DIAGNOSIS — Q0700 Arnold-Chiari syndrome without spina bifida or hydrocephalus: Secondary | ICD-10-CM | POA: Insufficient documentation

## 2017-09-13 DIAGNOSIS — Z3A19 19 weeks gestation of pregnancy: Secondary | ICD-10-CM | POA: Diagnosis not present

## 2017-09-13 DIAGNOSIS — O0992 Supervision of high risk pregnancy, unspecified, second trimester: Secondary | ICD-10-CM | POA: Insufficient documentation

## 2017-09-13 DIAGNOSIS — Z833 Family history of diabetes mellitus: Secondary | ICD-10-CM | POA: Diagnosis not present

## 2017-09-13 DIAGNOSIS — Z79899 Other long term (current) drug therapy: Secondary | ICD-10-CM | POA: Diagnosis not present

## 2017-09-13 DIAGNOSIS — O99342 Other mental disorders complicating pregnancy, second trimester: Secondary | ICD-10-CM | POA: Diagnosis not present

## 2017-09-13 DIAGNOSIS — O0991 Supervision of high risk pregnancy, unspecified, first trimester: Secondary | ICD-10-CM

## 2017-09-13 DIAGNOSIS — O219 Vomiting of pregnancy, unspecified: Secondary | ICD-10-CM

## 2017-09-13 DIAGNOSIS — Z8249 Family history of ischemic heart disease and other diseases of the circulatory system: Secondary | ICD-10-CM | POA: Insufficient documentation

## 2017-09-13 DIAGNOSIS — R7309 Other abnormal glucose: Secondary | ICD-10-CM
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
## 2017-09-18 ENCOUNTER — Encounter: Payer: BLUE CROSS/BLUE SHIELD | Admitting: Certified Nurse Midwife

## 2017-09-18 ENCOUNTER — Other Ambulatory Visit: Payer: BLUE CROSS/BLUE SHIELD

## 2017-09-24 ENCOUNTER — Ambulatory Visit (INDEPENDENT_AMBULATORY_CARE_PROVIDER_SITE_OTHER): Payer: BLUE CROSS/BLUE SHIELD | Admitting: Obstetrics & Gynecology

## 2017-09-24 VITALS — BP 120/80 | Wt 207.0 lb

## 2017-09-24 DIAGNOSIS — Z3A21 21 weeks gestation of pregnancy: Secondary | ICD-10-CM

## 2017-09-24 DIAGNOSIS — Q0700 Arnold-Chiari syndrome without spina bifida or hydrocephalus: Secondary | ICD-10-CM

## 2017-09-24 DIAGNOSIS — O99210 Obesity complicating pregnancy, unspecified trimester: Secondary | ICD-10-CM

## 2017-09-24 DIAGNOSIS — Q0701 Arnold-Chiari syndrome with spina bifida: Secondary | ICD-10-CM

## 2017-09-24 DIAGNOSIS — O0991 Supervision of high risk pregnancy, unspecified, first trimester: Secondary | ICD-10-CM

## 2017-09-24 NOTE — Patient Instructions (Signed)

## 2017-09-24 NOTE — Progress Notes (Signed)
  Subjective  Fetal Movement? yes Contractions? no Leaking Fluid? no Vaginal Bleeding? no  Objective  BP 120/80   Wt 207 lb (93.9 kg)   LMP 04/28/2017 (Exact Date)   BMI 35.98 kg/m  General: NAD Pumonary: no increased work of breathing Abdomen: gravid, non-tender Extremities: no edema Psychiatric: mood appropriate, affect full  Assessment  32 y.o. G1P0000 at [redacted]w[redacted]d by  02/02/2018, by Last Menstrual Period presenting for routine prenatal visit  Plan   Problem List Items Addressed This Visit      Nervous and Auditory   Chiari malformation (HCC)     Other   Supervision of high risk pregnancy in first trimester   Obesity affecting pregnancy, antepartum    Other Visit Diagnoses    [redacted] weeks gestation of pregnancy    -  Primary    [place] f/u Thurs Growth scan 30 weeks See MFM note for restrictions Breast feeding plans BC plans-uncertain.  Annamarie Major, MD, [facility], [facility] 09/24/2017  4:47 PM

## 2017-09-27 ENCOUNTER — Ambulatory Visit
Admission: RE | Admit: 2017-09-27 | Discharge: 2017-09-27 | Disposition: A | Discharge status: Home / Self Care | Payer: BLUE CROSS/BLUE SHIELD | Source: Ambulatory Visit | Attending: Maternal & Fetal Medicine | Admitting: Maternal & Fetal Medicine

## 2017-09-27 ENCOUNTER — Ambulatory Visit: Payer: BLUE CROSS/BLUE SHIELD

## 2017-09-27 DIAGNOSIS — O0991 Supervision of high risk pregnancy, unspecified, first trimester: Secondary | ICD-10-CM

## 2017-09-27 DIAGNOSIS — Z3A21 21 weeks gestation of pregnancy: Secondary | ICD-10-CM | POA: Diagnosis not present

## 2017-09-27 DIAGNOSIS — O219 Vomiting of pregnancy, unspecified: Secondary | ICD-10-CM

## 2017-10-22 ENCOUNTER — Encounter: Payer: BLUE CROSS/BLUE SHIELD | Admitting: Obstetrics and Gynecology

## 2017-10-24 ENCOUNTER — Ambulatory Visit (INDEPENDENT_AMBULATORY_CARE_PROVIDER_SITE_OTHER): Payer: BLUE CROSS/BLUE SHIELD | Admitting: Obstetrics and Gynecology

## 2017-10-24 VITALS — BP 120/80 | Wt 210.0 lb

## 2017-10-24 DIAGNOSIS — Q0701 Arnold-Chiari syndrome with spina bifida: Secondary | ICD-10-CM

## 2017-10-24 DIAGNOSIS — Z6836 Body mass index (BMI) 36.0-36.9, adult: Secondary | ICD-10-CM

## 2017-10-24 DIAGNOSIS — O99210 Obesity complicating pregnancy, unspecified trimester: Secondary | ICD-10-CM

## 2017-10-24 DIAGNOSIS — Q0700 Arnold-Chiari syndrome without spina bifida or hydrocephalus: Secondary | ICD-10-CM

## 2017-10-24 DIAGNOSIS — O0991 Supervision of high risk pregnancy, unspecified, first trimester: Secondary | ICD-10-CM

## 2017-10-24 DIAGNOSIS — Z3A25 25 weeks gestation of pregnancy: Secondary | ICD-10-CM

## 2017-10-24 NOTE — Progress Notes (Signed)
Routine Prenatal Care Visit  Subjective  Angela Mccormick is a 32 y.o. G1P0000 at [redacted]w[redacted]d being seen today for ongoing prenatal care.  She is currently monitored for the following issues for this high-risk pregnancy and has Chiari malformation (HCC); Migraines; Syncope; Nodule of neck; Elevated glucose; Obesity (BMI 30.0-34.9); Supervision of high risk pregnancy in first trimester; Obesity affecting pregnancy, antepartum; and Nausea and vomiting during pregnancy on their problem list.  ----------------------------------------------------------------------------------- Patient reports no complaints.   Contractions: Not present. Vag. Bleeding: None.  Movement: Present. Denies leaking of fluid.  ----------------------------------------------------------------------------------- The following portions of the patient's history were reviewed and updated as appropriate: allergies, current medications, past family history, past medical history, past social history, past surgical history and problem list. Problem list updated.   Objective  Blood pressure 120/80, weight 210 lb (95.3 kg), last menstrual period 04/28/2017. Pregravid weight 197 lb (89.4 kg) Total Weight Gain 13 lb (5.897 kg) Urinalysis: Urine Protein: Negative Urine Glucose: Negative  Fetal Status: Fetal Heart Rate (bpm): 150 Fundal Height: 40 cm Movement: Present     General:  Alert, oriented and cooperative. Patient is in no acute distress.  Skin: Skin is warm and dry. No rash noted.   Cardiovascular: Normal heart rate noted  Respiratory: Normal respiratory effort, no problems with respiration noted  Abdomen: Soft, gravid, appropriate for gestational age. Pain/Pressure: Absent     Pelvic:  Cervical exam deferred        Extremities: Normal range of motion.     ental Status: Normal mood and affect. Normal behavior. Normal judgment and thought content.     Assessment   32 y.o. G1P0000 at [redacted]w[redacted]d by  02/02/2018, by Last Menstrual  Period presenting for routine prenatal visit  Plan   Pregnancy#1 Problems (from 04/28/17 to present)    Problem Noted Resolved   Nausea and vomiting during pregnancy 08/13/2017 by Conard Novak, MD No   Supervision of high risk pregnancy in first trimester 06/19/2017 by Vena Austria, MD No   Overview Addendum 10/24/2017  6:14 PM by Natale Milch, MD    Clinic [facility] Prenatal Labs  Dating L=8 Blood type: A pos   Genetic Screen Declined Antibody: negative  Anatomic [place] Normal, complete Rubella: Immune Varicella: Immune  GTT HgbA1C 5.2  Third trimester:  RPR: NR  Rhogam Not applicable HBsAg: Negative  TDaP vaccine                        Flu Shot: HIV: Negative  Baby Food                                GBS:   Contraception  Pap: NIL HPV negative 06/19/17  CBB     CS/VBAC    Support Person           Obesity affecting pregnancy, antepartum 06/19/2017 by Vena Austria, MD No   Overview Signed 10/24/2017  6:19 PM by Natale Milch, MD    Growth [place] at  [ ] 28 [ ] 32 [ ] 74          Gestational age appropriate obstetric precautions including but not limited to vaginal bleeding, contractions, leaking of fluid and fetal movement were reviewed in detail with the patient.    Growth [place] at next visit.   Return in about 2 weeks (around 11/07/2017) for ROB, 28 week labs, [place].  [name] MD [facility],  [facility] 10/24/17 6:12 PM

## 2017-11-01 ENCOUNTER — Encounter: Payer: BLUE CROSS/BLUE SHIELD | Admitting: Family Medicine

## 2017-11-02 ENCOUNTER — Encounter: Payer: BLUE CROSS/BLUE SHIELD | Admitting: Family Medicine

## 2017-11-07 ENCOUNTER — Encounter: Payer: Self-pay | Admitting: Advanced Practice Midwife

## 2017-11-07 ENCOUNTER — Other Ambulatory Visit: Payer: BLUE CROSS/BLUE SHIELD

## 2017-11-07 ENCOUNTER — Ambulatory Visit (INDEPENDENT_AMBULATORY_CARE_PROVIDER_SITE_OTHER): Payer: BLUE CROSS/BLUE SHIELD

## 2017-11-07 ENCOUNTER — Ambulatory Visit (INDEPENDENT_AMBULATORY_CARE_PROVIDER_SITE_OTHER): Payer: BLUE CROSS/BLUE SHIELD | Admitting: Advanced Practice Midwife

## 2017-11-07 VITALS — BP 108/68 | Wt 210.0 lb

## 2017-11-07 DIAGNOSIS — Z3403 Encounter for supervision of normal first pregnancy, third trimester: Secondary | ICD-10-CM

## 2017-11-07 DIAGNOSIS — G43909 Migraine, unspecified, not intractable, without status migrainosus: Secondary | ICD-10-CM

## 2017-11-07 DIAGNOSIS — O219 Vomiting of pregnancy, unspecified: Secondary | ICD-10-CM

## 2017-11-07 DIAGNOSIS — Z130 Encounter for screening for diseases of the blood and blood-forming organs and certain disorders involving the immune mechanism: Secondary | ICD-10-CM | POA: Diagnosis not present

## 2017-11-07 DIAGNOSIS — O350XX0 Maternal care for (suspected) central nervous system malformation in fetus, not applicable or unspecified: Secondary | ICD-10-CM

## 2017-11-07 DIAGNOSIS — Z131 Encounter for screening for diabetes mellitus: Secondary | ICD-10-CM | POA: Diagnosis not present

## 2017-11-07 DIAGNOSIS — Z3A29 29 weeks gestation of pregnancy: Secondary | ICD-10-CM

## 2017-11-07 DIAGNOSIS — O0991 Supervision of high risk pregnancy, unspecified, first trimester: Secondary | ICD-10-CM

## 2017-11-07 DIAGNOSIS — Z113 Encounter for screening for infections with a predominantly sexual mode of transmission: Secondary | ICD-10-CM | POA: Diagnosis not present

## 2017-11-07 DIAGNOSIS — Z6836 Body mass index (BMI) 36.0-36.9, adult: Secondary | ICD-10-CM

## 2017-11-07 DIAGNOSIS — O99212 Obesity complicating pregnancy, second trimester: Secondary | ICD-10-CM

## 2017-11-07 DIAGNOSIS — Z3A27 27 weeks gestation of pregnancy: Secondary | ICD-10-CM

## 2017-11-07 MED ORDER — BREAST PUMP MISC: 0 refills | Status: DC

## 2017-11-07 NOTE — Progress Notes (Signed)
Pt reports no problems. 28 wk labs and growth scan today.

## 2017-11-07 NOTE — Patient Instructions (Signed)
Third Trimester of Pregnancy The third trimester is from week 28 through week 40 (months 7 through 9). The third trimester is a time when the unborn baby (fetus) is growing rapidly. At the end of the ninth month, the fetus is about 20 inches in length and weighs 6-10 pounds. Body changes during your third trimester Your body will continue to go through many changes during pregnancy. The changes vary from woman to woman. During the third trimester:  Your weight will continue to increase. You can expect to gain 25-35 pounds (11-16 kg) by the end of the pregnancy.  You may begin to get stretch marks on your hips, abdomen, and breasts.  You may urinate more often because the fetus is moving lower into your pelvis and pressing on your bladder.  You may develop or continue to have heartburn. This is caused by increased hormones that slow down muscles in the digestive tract.  You may develop or continue to have constipation because increased hormones slow digestion and cause the muscles that push waste through your intestines to relax.  You may develop hemorrhoids. These are swollen veins (varicose veins) in the rectum that can itch or be painful.  You may develop swollen, bulging veins (varicose veins) in your legs.  You may have increased body aches in the pelvis, back, or thighs. This is due to weight gain and increased hormones that are relaxing your joints.  You may have changes in your hair. These can include thickening of your hair, rapid growth, and changes in texture. Some women also have hair loss during or after pregnancy, or hair that feels dry or thin. Your hair will most likely return to normal after your baby is born.  Your breasts will continue to grow and they will continue to become tender. A yellow fluid (colostrum) may leak from your breasts. This is the first milk you are producing for your baby.  Your belly button may stick out.  You may notice more swelling in your hands,  face, or ankles.  You may have increased tingling or numbness in your hands, arms, and legs. The skin on your belly may also feel numb.  You may feel short of breath because of your expanding uterus.  You may have more problems sleeping. This can be caused by the size of your belly, increased need to urinate, and an increase in your body's metabolism.  You may notice the fetus "dropping," or moving lower in your abdomen (lightening).  You may have increased vaginal discharge.  You may notice your joints feel loose and you may have pain around your pelvic bone.  What to expect at prenatal visits You will have prenatal exams every 2 weeks until week 36. Then you will have weekly prenatal exams. During a routine prenatal visit:  You will be weighed to make sure you and the baby are growing normally.  Your blood pressure will be taken.  Your abdomen will be measured to track your baby's growth.  The fetal heartbeat will be listened to.  Any test results from the previous visit will be discussed.  You may have a cervical check near your due date to see if your cervix has softened or thinned (effaced).  You will be tested for Group B streptococcus. This happens between 35 and 37 weeks.  Your health care provider may ask you:  What your birth plan is.  How you are feeling.  If you are feeling the baby move.  If you have had   any abnormal symptoms, such as leaking fluid, bleeding, severe headaches, or abdominal cramping.  If you are using any tobacco products, including cigarettes, chewing tobacco, and electronic cigarettes.  If you have any questions.  Other tests or screenings that may be performed during your third trimester include:  Blood tests that check for low iron levels (anemia).  Fetal testing to check the health, activity level, and growth of the fetus. Testing is done if you have certain medical conditions or if there are problems during the  pregnancy.  Nonstress test (NST). This test checks the health of your baby to make sure there are no signs of problems, such as the baby not getting enough oxygen. During this test, a belt is placed around your belly. The baby is made to move, and its heart rate is monitored during movement.  What is false labor? False labor is a condition in which you feel small, irregular tightenings of the muscles in the womb (contractions) that usually go away with rest, changing position, or drinking water. These are called Braxton Hicks contractions. Contractions may last for hours, days, or even weeks before true labor sets in. If contractions come at regular intervals, become more frequent, increase in intensity, or become painful, you should see your health care provider. What are the signs of labor?  Abdominal cramps.  Regular contractions that start at 10 minutes apart and become stronger and more frequent with time.  Contractions that start on the top of the uterus and spread down to the lower abdomen and back.  Increased pelvic pressure and dull back pain.  A watery or bloody mucus discharge that comes from the vagina.  Leaking of amniotic fluid. This is also known as your "water breaking." It could be a slow trickle or a gush. Let your health care provider know if it has a color or strange odor. If you have any of these signs, call your health care provider right away, even if it is before your due date. Follow these instructions at home: Medicines  Follow your health care provider's instructions regarding medicine use. Specific medicines may be either safe or unsafe to take during pregnancy.  Take a prenatal vitamin that contains at least 600 micrograms (mcg) of folic acid.  If you develop constipation, try taking a stool softener if your health care provider approves. Eating and drinking  Eat a balanced diet that includes fresh fruits and vegetables, whole grains, good sources of protein  such as meat, eggs, or tofu, and low-fat dairy. Your health care provider will help you determine the amount of weight gain that is right for you.  Avoid raw meat and uncooked cheese. These carry germs that can cause birth defects in the baby.  If you have low calcium intake from food, talk to your health care provider about whether you should take a daily calcium supplement.  Eat four or five small meals rather than three large meals a day.  Limit foods that are high in fat and processed sugars, such as fried and sweet foods.  To prevent constipation: ? Drink enough fluid to keep your urine clear or pale yellow. ? Eat foods that are high in fiber, such as fresh fruits and vegetables, whole grains, and beans. Activity  Exercise only as directed by your health care provider. Most women can continue their usual exercise routine during pregnancy. Try to exercise for 30 minutes at least 5 days a week. Stop exercising if you experience uterine contractions.  Avoid heavy   lifting.  Do not exercise in extreme heat or humidity, or at high altitudes.  Wear low-heel, comfortable shoes.  Practice good posture.  You may continue to have sex unless your health care provider tells you otherwise. Relieving pain and discomfort  Take frequent breaks and rest with your legs elevated if you have leg cramps or low back pain.  Take warm sitz baths to soothe any pain or discomfort caused by hemorrhoids. Use hemorrhoid cream if your health care provider approves.  Wear a good support bra to prevent discomfort from breast tenderness.  If you develop varicose veins: ? Wear support pantyhose or compression stockings as told by your healthcare provider. ? Elevate your feet for 15 minutes, 3-4 times a day. Prenatal care  Write down your questions. Take them to your prenatal visits.  Keep all your prenatal visits as told by your health care provider. This is important. Safety  Wear your seat belt at  all times when driving.  Make a list of emergency phone numbers, including numbers for family, friends, the hospital, and police and fire departments. General instructions  Avoid cat litter boxes and soil used by cats. These carry germs that can cause birth defects in the baby. If you have a cat, ask someone to clean the litter box for you.  Do not travel far distances unless it is absolutely necessary and only with the approval of your health care provider.  Do not use hot tubs, steam rooms, or saunas.  Do not drink alcohol.  Do not use any products that contain nicotine or tobacco, such as cigarettes and e-cigarettes. If you need help quitting, ask your health care provider.  Do not use any medicinal herbs or unprescribed drugs. These chemicals affect the formation and growth of the baby.  Do not douche or use tampons or scented sanitary pads.  Do not cross your legs for long periods of time.  To prepare for the arrival of your baby: ? Take prenatal classes to understand, practice, and ask questions about labor and delivery. ? Make a trial run to the hospital. ? Visit the hospital and tour the maternity area. ? Arrange for maternity or paternity leave through employers. ? Arrange for family and friends to take care of pets while you are in the hospital. ? Purchase a rear-facing car seat and make sure you know how to install it in your car. ? Pack your hospital bag. ? Prepare the baby's nursery. Make sure to remove all pillows and stuffed animals from the baby's crib to prevent suffocation.  Visit your dentist if you have not gone during your pregnancy. Use a soft toothbrush to brush your teeth and be gentle when you floss. Contact a health care provider if:  You are unsure if you are in labor or if your water has broken.  You become dizzy.  You have mild pelvic cramps, pelvic pressure, or nagging pain in your abdominal area.  You have lower back pain.  You have persistent  nausea, vomiting, or diarrhea.  You have an unusual or bad smelling vaginal discharge.  You have pain when you urinate. Get help right away if:  Your water breaks before 37 weeks.  You have regular contractions less than 5 minutes apart before 37 weeks.  You have a fever.  You are leaking fluid from your vagina.  You have spotting or bleeding from your vagina.  You have severe abdominal pain or cramping.  You have rapid weight loss or weight gain.    You have shortness of breath with chest pain.  You notice sudden or extreme swelling of your face, hands, ankles, feet, or legs.  Your baby makes fewer than 10 movements in 2 hours.  You have severe headaches that do not go away when you take medicine.  You have vision changes. Summary  The third trimester is from week 28 through week 40, months 7 through 9. The third trimester is a time when the unborn baby (fetus) is growing rapidly.  During the third trimester, your discomfort may increase as you and your baby continue to gain weight. You may have abdominal, leg, and back pain, sleeping problems, and an increased need to urinate.  During the third trimester your breasts will keep growing and they will continue to become tender. A yellow fluid (colostrum) may leak from your breasts. This is the first milk you are producing for your baby.  False labor is a condition in which you feel small, irregular tightenings of the muscles in the womb (contractions) that eventually go away. These are called Braxton Hicks contractions. Contractions may last for hours, days, or even weeks before true labor sets in.  Signs of labor can include: abdominal cramps; regular contractions that start at 10 minutes apart and become stronger and more frequent with time; watery or bloody mucus discharge that comes from the vagina; increased pelvic pressure and dull back pain; and leaking of amniotic fluid. This information is not intended to replace advice  given to you by your health care provider. Make sure you discuss any questions you have with your health care provider. Document Released: 04/25/2001 Document Revised: 10/07/2015 Document Reviewed: 07/02/2012 Elsevier Interactive Patient Education  2017 [employer]  

## 2017-11-07 NOTE — Progress Notes (Signed)
Routine Prenatal Care Visit  Subjective  Angela Mccormick is a 32 y.o. G1P0000 at [redacted]w[redacted]d being seen today for ongoing prenatal care.  She is currently monitored for the following issues for this high-risk pregnancy and has Chiari malformation (HCC); Migraines; Syncope; Nodule of neck; Elevated glucose; BMI 36.0-36.9,adult; Supervision of high risk pregnancy in first trimester; Obesity affecting pregnancy, antepartum; and Nausea and vomiting during pregnancy on their problem list.  ----------------------------------------------------------------------------------- Patient reports itching on the backs of her knees. Suggested comfort measures. Patient is having 28 week labs today. Discussed importance of decreased carbohydrate/sugar intake and increased activity level for prevention of macrosomia. Contractions: Not present. Vag. Bleeding: None.  Movement: Present. Denies leaking of fluid.  ----------------------------------------------------------------------------------- The following portions of the patient's history were reviewed and updated as appropriate: allergies, current medications, past family history, past medical history, past social history, past surgical history and problem list. Problem list updated.   Objective  Blood pressure 108/68, weight 210 lb (95.3 kg), last menstrual period 04/28/2017. Pregravid weight 197 lb (89.4 kg) Total Weight Gain 13 lb (5.897 kg) Urinalysis: Urine Protein: Negative Urine Glucose: 3+  Fetal Status: Fetal Heart Rate (bpm): 145   Movement: Present     Growth scan today: 2 pounds 14 ounces, 72%, BPD >97%, HC 89%  General:  Alert, oriented and cooperative. Patient is in no acute distress.  Skin: Skin is warm and dry. No rash noted.   Cardiovascular: Normal heart rate noted  Respiratory: Normal respiratory effort, no problems with respiration noted  Abdomen: Soft, gravid, appropriate for gestational age. Pain/Pressure: Absent     Pelvic:  Cervical exam  deferred        Extremities: Normal range of motion.  Edema: None  Mental Status: Normal mood and affect. Normal behavior. Normal judgment and thought content.   Assessment   32 y.o. G1P0000 at [redacted]w[redacted]d by  02/02/2018, by Last Menstrual Period presenting for routine prenatal visit  Plan   Pregnancy#1 Problems (from 04/28/17 to present)    Problem Noted Resolved   Nausea and vomiting during pregnancy 08/13/2017 by Conard Novak, MD No   Supervision of high risk pregnancy in first trimester 06/19/2017 by Vena Austria, MD No   Overview Addendum 10/24/2017  6:14 PM by Natale Milch, MD    [facility]inic [facility] Prenatal Labs  Dating L=8 Blood type: A pos   Genetic Screen Declined Antibody: negative  Anatomic [place] Normal, complete Rubella: Immune Varicella: Immune  GTT HgbA1C 5.2  Third trimester:  RPR: NR  Rhogam Not applicable HBsAg: Negative  TDaP vaccine                        Flu Shot: HIV: Negative  Baby Food                                GBS:   Contraception  Pap: NIL HPV negative 06/19/17  CBB     CS/VBAC    Support Person           Obesity affecting pregnancy, antepartum 06/19/2017 by Vena Austria, MD No   Overview Signed 10/24/2017  6:19 PM by Natale Milch, MD    Growth [place] at  [ ] 28 [ ] 32 [ ] 36           Preterm labor symptoms and general obstetric precautions including but not limited to vaginal bleeding, contractions, leaking of fluid and fetal movement  were reviewed in detail with the patient. Please refer to After Visit Summary for other counseling recommendations.   Return in about 2 weeks (around 11/21/2017) for rob.  Tresea MallJane Bradey Luzier, CNM 11/07/2017 10:44 AM

## 2017-11-08 LAB — 28 WEEK RH+PANEL
Basophils Absolute: 0 10*3/uL (ref 0.0–0.2)
Basos: 0 %
EOS (ABSOLUTE): 0 10*3/uL (ref 0.0–0.4)
Eos: 0 %
Gestational Diabetes Screen: 143 mg/dL — ABNORMAL HIGH (ref 65–139)
HIV Screen 4th Generation wRfx: NONREACTIVE
Hematocrit: 34.5 % (ref 34.0–46.6)
Hemoglobin: 11.3 g/dL (ref 11.1–15.9)
Immature Grans (Abs): 0 10*3/uL (ref 0.0–0.1)
Immature Granulocytes: 0 %
Lymphocytes Absolute: 1.1 10*3/uL (ref 0.7–3.1)
Lymphs: 13 %
MCH: 29 pg (ref 26.6–33.0)
MCHC: 32.8 g/dL (ref 31.5–35.7)
MCV: 89 fL (ref 79–97)
Monocytes Absolute: 0.4 10*3/uL (ref 0.1–0.9)
Monocytes: 5 %
Neutrophils Absolute: 6.6 10*3/uL (ref 1.4–7.0)
Neutrophils: 82 %
Platelets: 240 10*3/uL (ref 150–450)
RBC: 3.9 x10E6/uL (ref 3.77–5.28)
RDW: 14.3 % (ref 12.3–15.4)
RPR Ser Ql: NONREACTIVE
WBC: 8.2 10*3/uL (ref 3.4–10.8)

## 2017-11-09 ENCOUNTER — Other Ambulatory Visit: Payer: Self-pay | Admitting: Advanced Practice Midwife

## 2017-11-09 DIAGNOSIS — O99810 Abnormal glucose complicating pregnancy: Secondary | ICD-10-CM

## 2017-11-09 NOTE — Progress Notes (Signed)
Order placed for 3 hour gtt due to elevated 1 hour gtt. Patient aware to call to schedule.

## 2017-11-19 ENCOUNTER — Other Ambulatory Visit: Payer: BLUE CROSS/BLUE SHIELD

## 2017-11-19 DIAGNOSIS — O99810 Abnormal glucose complicating pregnancy: Secondary | ICD-10-CM

## 2017-11-19 DIAGNOSIS — O24419 Gestational diabetes mellitus in pregnancy, unspecified control: Secondary | ICD-10-CM

## 2017-11-20 LAB — GESTATIONAL GLUCOSE TOLERANCE
Glucose, Fasting: 94 mg/dL (ref 65–94)
Glucose, GTT - 1 Hour: 186 mg/dL — ABNORMAL HIGH (ref 65–179)
Glucose, GTT - 2 Hour: 165 mg/dL — ABNORMAL HIGH (ref 65–154)
Glucose, GTT - 3 Hour: 122 mg/dL (ref 65–139)

## 2017-11-21 ENCOUNTER — Ambulatory Visit (INDEPENDENT_AMBULATORY_CARE_PROVIDER_SITE_OTHER): Payer: BLUE CROSS/BLUE SHIELD | Admitting: Obstetrics and Gynecology

## 2017-11-21 ENCOUNTER — Encounter: Payer: Self-pay | Admitting: Obstetrics and Gynecology

## 2017-11-21 VITALS — BP 120/80 | Wt 213.0 lb

## 2017-11-21 DIAGNOSIS — O24419 Gestational diabetes mellitus in pregnancy, unspecified control: Secondary | ICD-10-CM

## 2017-11-21 DIAGNOSIS — O99210 Obesity complicating pregnancy, unspecified trimester: Secondary | ICD-10-CM

## 2017-11-21 DIAGNOSIS — O0991 Supervision of high risk pregnancy, unspecified, first trimester: Secondary | ICD-10-CM

## 2017-11-21 DIAGNOSIS — Z6837 Body mass index (BMI) 37.0-37.9, adult: Secondary | ICD-10-CM

## 2017-11-21 DIAGNOSIS — Z3A29 29 weeks gestation of pregnancy: Secondary | ICD-10-CM

## 2017-11-21 DIAGNOSIS — Q0701 Arnold-Chiari syndrome with spina bifida: Secondary | ICD-10-CM

## 2017-11-21 DIAGNOSIS — O99213 Obesity complicating pregnancy, third trimester: Secondary | ICD-10-CM

## 2017-11-21 DIAGNOSIS — Q0700 Arnold-Chiari syndrome without spina bifida or hydrocephalus: Secondary | ICD-10-CM

## 2017-11-21 DIAGNOSIS — O24410 Gestational diabetes mellitus in pregnancy, diet controlled: Secondary | ICD-10-CM

## 2017-11-21 MED ORDER — GLUCOSE BLOOD VI STRP: ORAL_STRIP | 12 refills | Status: DC

## 2017-11-21 MED ORDER — ACCU-CHEK FASTCLIX LANCETS MISC: 1.0000 | Freq: Four times a day (QID) | 12 refills | Status: DC

## 2017-11-21 NOTE — Progress Notes (Signed)
Referral sent to [facility] for GDM. Left message for patient regarding referral.

## 2017-11-21 NOTE — Progress Notes (Signed)
Routine Prenatal Care Visit  Subjective  Angela Mccormick is a 32 y.o. G1P0000 at [redacted]w[redacted]d being seen today for ongoing prenatal care.  She is currently monitored for the following issues for this high-risk pregnancy and has Chiari malformation (HCC); Migraines; Syncope; Nodule of neck; Elevated glucose; BMI 37.0-37.9, adult; Supervision of high risk pregnancy in first trimester; Obesity affecting pregnancy, antepartum; Nausea and vomiting during pregnancy; and Diet controlled gestational diabetes mellitus (GDM) in third trimester on their problem list.  ----------------------------------------------------------------------------------- Patient reports no complaints.   Contractions: Not present. Vag. Bleeding: None.  Movement: Present. Denies leaking of fluid.  ----------------------------------------------------------------------------------- The following portions of the patient's history were reviewed and updated as appropriate: allergies, current medications, past family history, past medical history, past social history, past surgical history and problem list. Problem list updated.   Objective  Blood pressure 120/80, weight 213 lb (96.6 kg), last menstrual period 04/28/2017. Pregravid weight 197 lb (89.4 kg) Total Weight Gain 16 lb (7.258 kg) Urinalysis:      Fetal Status: Fetal Heart Rate (bpm): 125 Fundal Height: 40 cm Movement: Present     General:  Alert, oriented and cooperative. Patient is in no acute distress.  Skin: Skin is warm and dry. No rash noted.   Cardiovascular: Normal heart rate noted  Respiratory: Normal respiratory effort, no problems with respiration noted  Abdomen: Soft, gravid, appropriate for gestational age. Pain/Pressure: Absent     Pelvic:  Cervical exam deferred        Extremities: Normal range of motion.  Edema: None  ental Status: Normal mood and affect. Normal behavior. Normal judgment and thought content.     Assessment   32 y.o. G1P0000 at [redacted]w[redacted]d  by  02/02/2018, by Last Menstrual Period presenting for routine prenatal visit  Plan   Pregnancy#1 Problems (from 04/28/17 to present)    Problem Noted Resolved   Nausea and vomiting during pregnancy 08/13/2017 by Conard Novak, MD No   Supervision of high risk pregnancy in first trimester 06/19/2017 by Vena Austria, MD No   Overview Addendum 11/21/2017  4:02 PM by Natale Milch, MD    [facility] Prenatal Labs  Dating L=8 Blood type: A pos   Genetic Screen Declined Antibody: negative  Anatomic [place] Normal, complete Rubella: Immune Varicella: Immune  GTT HgbA1C 5.2   Third trimester: ELEVATED RPR: NR  Rhogam Not applicable HBsAg: Negative  TDaP vaccine                        Flu Shot: HIV: Negative  Baby Food                                GBS:   Contraception  Pap: NIL HPV negative 06/19/17  CBB     CS/VBAC    Support Person           Obesity affecting pregnancy, antepartum 06/19/2017 by Vena Austria, MD No   Overview Signed 10/24/2017  6:19 PM by Natale Milch, MD    Growth [place] at  [ X]28 [ ] 32 [ ] 36       BMI 37.0-37.9, adult 04/13/2017 by Glori Luis, MD No       Gestational age appropriate obstetric precautions including but not limited to vaginal bleeding, contractions, leaking of fluid and fetal movement were reviewed in detail with the patient.    Discussed gestational diabetes with the  patient and proper timing and testing of her blood glucose. Provided her with a glucose log and a glucose meter. Prescriptions sent for lancets and test strips.  Referral made by Erskine SquibbJane for nutrition.  Will need Tdap at next visit. Will need growth US at 32 weeks. Will need to see anesthesia in the third trimester for her Chiari Malformation.   Return in about 1 week (around 11/28/2017) for ROB.  Adelene Idlerhristanna Anamaria Dusenbury MD [facility], [facility] 11/21/17 4:38 PM

## 2017-11-27 ENCOUNTER — Ambulatory Visit: Payer: BLUE CROSS/BLUE SHIELD | Admitting: Dietician

## 2017-11-28 ENCOUNTER — Encounter: Payer: BLUE CROSS/BLUE SHIELD | Attending: Advanced Practice Midwife | Admitting: *Deleted

## 2017-11-28 ENCOUNTER — Encounter: Payer: Self-pay | Admitting: *Deleted

## 2017-11-28 ENCOUNTER — Encounter: Payer: Self-pay | Admitting: Obstetrics and Gynecology

## 2017-11-28 ENCOUNTER — Telehealth: Payer: Self-pay

## 2017-11-28 ENCOUNTER — Ambulatory Visit (INDEPENDENT_AMBULATORY_CARE_PROVIDER_SITE_OTHER): Payer: BLUE CROSS/BLUE SHIELD | Admitting: Obstetrics & Gynecology

## 2017-11-28 VITALS — BP 100/68 | Ht 63.0 in | Wt 211.9 lb

## 2017-11-28 VITALS — BP 110/64 | Wt 213.0 lb

## 2017-11-28 DIAGNOSIS — Z3A00 Weeks of gestation of pregnancy not specified: Secondary | ICD-10-CM | POA: Insufficient documentation

## 2017-11-28 DIAGNOSIS — Q0700 Arnold-Chiari syndrome without spina bifida or hydrocephalus: Secondary | ICD-10-CM

## 2017-11-28 DIAGNOSIS — O24419 Gestational diabetes mellitus in pregnancy, unspecified control: Secondary | ICD-10-CM | POA: Diagnosis not present

## 2017-11-28 DIAGNOSIS — O0991 Supervision of high risk pregnancy, unspecified, first trimester: Secondary | ICD-10-CM

## 2017-11-28 DIAGNOSIS — Z713 Dietary counseling and surveillance: Secondary | ICD-10-CM | POA: Diagnosis not present

## 2017-11-28 DIAGNOSIS — Q0701 Arnold-Chiari syndrome with spina bifida: Secondary | ICD-10-CM

## 2017-11-28 DIAGNOSIS — O24410 Gestational diabetes mellitus in pregnancy, diet controlled: Secondary | ICD-10-CM

## 2017-11-28 DIAGNOSIS — Z3A30 30 weeks gestation of pregnancy: Secondary | ICD-10-CM

## 2017-11-28 NOTE — Telephone Encounter (Signed)
Patient notified

## 2017-11-28 NOTE — Patient Instructions (Signed)
Third Trimester of Pregnancy The third trimester is from week 28 through week 40 (months 7 through 9). The third trimester is a time when the unborn baby (fetus) is growing rapidly. At the end of the ninth month, the fetus is about 20 inches in length and weighs 6-10 pounds. Body changes during your third trimester Your body will continue to go through many changes during pregnancy. The changes vary from woman to woman. During the third trimester:  Your weight will continue to increase. You can expect to gain 25-35 pounds (11-16 kg) by the end of the pregnancy.  You may begin to get stretch marks on your hips, abdomen, and breasts.  You may urinate more often because the fetus is moving lower into your pelvis and pressing on your bladder.  You may develop or continue to have heartburn. This is caused by increased hormones that slow down muscles in the digestive tract.  You may develop or continue to have constipation because increased hormones slow digestion and cause the muscles that push waste through your intestines to relax.  You may develop hemorrhoids. These are swollen veins (varicose veins) in the rectum that can itch or be painful.  You may develop swollen, bulging veins (varicose veins) in your legs.  You may have increased body aches in the pelvis, back, or thighs. This is due to weight gain and increased hormones that are relaxing your joints.  You may have changes in your hair. These can include thickening of your hair, rapid growth, and changes in texture. Some women also have hair loss during or after pregnancy, or hair that feels dry or thin. Your hair will most likely return to normal after your baby is born.  Your breasts will continue to grow and they will continue to become tender. A yellow fluid (colostrum) may leak from your breasts. This is the first milk you are producing for your baby.  Your belly button may stick out.  You may notice more swelling in your hands,  face, or ankles.  You may have increased tingling or numbness in your hands, arms, and legs. The skin on your belly may also feel numb.  You may feel short of breath because of your expanding uterus.  You may have more problems sleeping. This can be caused by the size of your belly, increased need to urinate, and an increase in your body's metabolism.  You may notice the fetus "dropping," or moving lower in your abdomen (lightening).  You may have increased vaginal discharge.  You may notice your joints feel loose and you may have pain around your pelvic bone.  What to expect at prenatal visits You will have prenatal exams every 2 weeks until week 36. Then you will have weekly prenatal exams. During a routine prenatal visit:  You will be weighed to make sure you and the baby are growing normally.  Your blood pressure will be taken.  Your abdomen will be measured to track your baby's growth.  The fetal heartbeat will be listened to.  Any test results from the previous visit will be discussed.  You may have a cervical check near your due date to see if your cervix has softened or thinned (effaced).  You will be tested for Group B streptococcus. This happens between 35 and 37 weeks.  Your health care provider may ask you:  What your birth plan is.  How you are feeling.  If you are feeling the baby move.  If you have had   any abnormal symptoms, such as leaking fluid, bleeding, severe headaches, or abdominal cramping.  If you are using any tobacco products, including cigarettes, chewing tobacco, and electronic cigarettes.  If you have any questions.  Other tests or screenings that may be performed during your third trimester include:  Blood tests that check for low iron levels (anemia).  Fetal testing to check the health, activity level, and growth of the fetus. Testing is done if you have certain medical conditions or if there are problems during the  pregnancy.  Nonstress test (NST). This test checks the health of your baby to make sure there are no signs of problems, such as the baby not getting enough oxygen. During this test, a belt is placed around your belly. The baby is made to move, and its heart rate is monitored during movement.  What is false labor? False labor is a condition in which you feel small, irregular tightenings of the muscles in the womb (contractions) that usually go away with rest, changing position, or drinking water. These are called Braxton Hicks contractions. Contractions may last for hours, days, or even weeks before true labor sets in. If contractions come at regular intervals, become more frequent, increase in intensity, or become painful, you should see your health care provider. What are the signs of labor?  Abdominal cramps.  Regular contractions that start at 10 minutes apart and become stronger and more frequent with time.  Contractions that start on the top of the uterus and spread down to the lower abdomen and back.  Increased pelvic pressure and dull back pain.  A watery or bloody mucus discharge that comes from the vagina.  Leaking of amniotic fluid. This is also known as your "water breaking." It could be a slow trickle or a gush. Let your health care provider know if it has a color or strange odor. If you have any of these signs, call your health care provider right away, even if it is before your due date. Follow these instructions at home: Medicines  Follow your health care provider's instructions regarding medicine use. Specific medicines may be either safe or unsafe to take during pregnancy.  Take a prenatal vitamin that contains at least 600 micrograms (mcg) of folic acid.  If you develop constipation, try taking a stool softener if your health care provider approves. Eating and drinking  Eat a balanced diet that includes fresh fruits and vegetables, whole grains, good sources of protein  such as meat, eggs, or tofu, and low-fat dairy. Your health care provider will help you determine the amount of weight gain that is right for you.  Avoid raw meat and uncooked cheese. These carry germs that can cause birth defects in the baby.  If you have low calcium intake from food, talk to your health care provider about whether you should take a daily calcium supplement.  Eat four or five small meals rather than three large meals a day.  Limit foods that are high in fat and processed sugars, such as fried and sweet foods.  To prevent constipation: ? Drink enough fluid to keep your urine clear or pale yellow. ? Eat foods that are high in fiber, such as fresh fruits and vegetables, whole grains, and beans. Activity  Exercise only as directed by your health care provider. Most women can continue their usual exercise routine during pregnancy. Try to exercise for 30 minutes at least 5 days a week. Stop exercising if you experience uterine contractions.  Avoid heavy   lifting.  Do not exercise in extreme heat or humidity, or at high altitudes.  Wear low-heel, comfortable shoes.  Practice good posture.  You may continue to have sex unless your health care provider tells you otherwise. Relieving pain and discomfort  Take frequent breaks and rest with your legs elevated if you have leg cramps or low back pain.  Take warm sitz baths to soothe any pain or discomfort caused by hemorrhoids. Use hemorrhoid cream if your health care provider approves.  Wear a good support bra to prevent discomfort from breast tenderness.  If you develop varicose veins: ? Wear support pantyhose or compression stockings as told by your healthcare provider. ? Elevate your feet for 15 minutes, 3-4 times a day. Prenatal care  Write down your questions. Take them to your prenatal visits.  Keep all your prenatal visits as told by your health care provider. This is important. Safety  Wear your seat belt at  all times when driving.  Make a list of emergency phone numbers, including numbers for family, friends, the hospital, and police and fire departments. General instructions  Avoid cat litter boxes and soil used by cats. These carry germs that can cause birth defects in the baby. If you have a cat, ask someone to clean the litter box for you.  Do not travel far distances unless it is absolutely necessary and only with the approval of your health care provider.  Do not use hot tubs, steam rooms, or saunas.  Do not drink alcohol.  Do not use any products that contain nicotine or tobacco, such as cigarettes and e-cigarettes. If you need help quitting, ask your health care provider.  Do not use any medicinal herbs or unprescribed drugs. These chemicals affect the formation and growth of the baby.  Do not douche or use tampons or scented sanitary pads.  Do not cross your legs for long periods of time.  To prepare for the arrival of your baby: ? Take prenatal classes to understand, practice, and ask questions about labor and delivery. ? Make a trial run to the hospital. ? Visit the hospital and tour the maternity area. ? Arrange for maternity or paternity leave through employers. ? Arrange for family and friends to take care of pets while you are in the hospital. ? Purchase a rear-facing car seat and make sure you know how to install it in your car. ? Pack your hospital bag. ? Prepare the baby's nursery. Make sure to remove all pillows and stuffed animals from the baby's crib to prevent suffocation.  Visit your dentist if you have not gone during your pregnancy. Use a soft toothbrush to brush your teeth and be gentle when you floss. Contact a health care provider if:  You are unsure if you are in labor or if your water has broken.  You become dizzy.  You have mild pelvic cramps, pelvic pressure, or nagging pain in your abdominal area.  You have lower back pain.  You have persistent  nausea, vomiting, or diarrhea.  You have an unusual or bad smelling vaginal discharge.  You have pain when you urinate. Get help right away if:  Your water breaks before 37 weeks.  You have regular contractions less than 5 minutes apart before 37 weeks.  You have a fever.  You are leaking fluid from your vagina.  You have spotting or bleeding from your vagina.  You have severe abdominal pain or cramping.  You have rapid weight loss or weight gain.    You have shortness of breath with chest pain.  You notice sudden or extreme swelling of your face, hands, ankles, feet, or legs.  Your baby makes fewer than 10 movements in 2 hours.  You have severe headaches that do not go away when you take medicine.  You have vision changes. Summary  The third trimester is from week 28 through week 40, months 7 through 9. The third trimester is a time when the unborn baby (fetus) is growing rapidly.  During the third trimester, your discomfort may increase as you and your baby continue to gain weight. You may have abdominal, leg, and back pain, sleeping problems, and an increased need to urinate.  During the third trimester your breasts will keep growing and they will continue to become tender. A yellow fluid (colostrum) may leak from your breasts. This is the first milk you are producing for your baby.  False labor is a condition in which you feel small, irregular tightenings of the muscles in the womb (contractions) that eventually go away. These are called Braxton Hicks contractions. Contractions may last for hours, days, or even weeks before true labor sets in.  Signs of labor can include: abdominal cramps; regular contractions that start at 10 minutes apart and become stronger and more frequent with time; watery or bloody mucus discharge that comes from the vagina; increased pelvic pressure and dull back pain; and leaking of amniotic fluid. This information is not intended to replace advice  given to you by your health care provider. Make sure you discuss any questions you have with your health care provider. Document Released: 04/25/2001 Document Revised: 10/07/2015 Document Reviewed: 07/02/2012 Elsevier Interactive Patient Education  2017 [employer]  

## 2017-11-28 NOTE — Telephone Encounter (Signed)
We can hold off on her physical until after she has delivered her baby.

## 2017-11-28 NOTE — Telephone Encounter (Signed)
Copied from CRM [id number]. Topic: General - Other >> Nov 28, 2017  3:43 PM Tamela OddiHarris, Brenda J wrote: Reason for CRM: Patient called to ask doctor if she should still come in for her physical on 12/14/17 since she has already seen her OB gyn.  Please advise.  CB# [phone].

## 2017-11-28 NOTE — Progress Notes (Signed)
  Subjective  Fetal Movement? yes Contractions? no Leaking Fluid? no Vaginal Bleeding? no BS initially 50% abnormal, as she has just started we will montior one more week Objective  BP 110/64   Wt 213 lb (96.6 kg)   LMP 04/28/2017 (Exact Date)   BMI 37.73 kg/m  General: NAD Pumonary: no increased work of breathing Abdomen: gravid, non-tender Extremities: no edema Psychiatric: mood appropriate, affect full  Assessment  32 y.o. G1P0000 at [id number] by  02/02/2018, by Last Menstrual Period presenting for routine prenatal visit  Plan   Problem List Items Addressed This Visit      Endocrine   Diet controlled gestational diabetes mellitus (GDM) in third trimester     Nervous and Auditory   Chiari malformation (HCC)     Other   Supervision of high risk pregnancy in first trimester    Other Visit Diagnoses    [redacted] weeks gestation of pregnancy    -  Primary    TDaP today If BS cont >50%, then start meds next week US w 2 weeks growth Anes consult due to Chiari Type II  Annamarie MajorPaul Halo Laski, MD, Merlinda FrederickFACOG [facility] 11/28/2017  2:15 PM

## 2017-11-28 NOTE — Telephone Encounter (Signed)
Please advise 

## 2017-11-28 NOTE — Patient Instructions (Signed)
Read booklet on Gestational Diabetes Follow Gestational Meal Planning Guidelines Avoid fruit juices and sugar-sweetened drinks Limit fried foods Don't skip meals Complete a 3 Day Food Record and bring to next appointment Check blood sugars 4 x day - before breakfast and 2 hrs after every meal and record  Bring blood sugar log to all appointments Purchase urine ketone strips if blood sugars not in control and check urine ketones every am:  If + increase bedtime snack to 1 protein and 2 carbohydrate servings Walk 20-30 minutes at least 5 x week if permitted by MD

## 2017-11-28 NOTE — Progress Notes (Signed)
Diabetes Self-Management Education  Visit Type: First/Initial  Appt. Start Time: 0820 Appt. End Time: 0945  11/28/2017  Angela Mccormick, identified by name and date of birth, is a 32 y.o. female with a diagnosis of Diabetes: Gestational Diabetes.   ASSESSMENT  Blood pressure 100/68, height _0  (1.6 m), weight 211 lb 14.4 oz (96.1 kg), last menstrual period 04/28/2017. Body mass index is 37.54 kg/m.  Diabetes Self-Management Education - 11/28/17 0932      Visit Information   Visit Type  First/Initial      Initial Visit   Diabetes Type  Gestational Diabetes    Are you currently following a meal plan?  No    Are you taking your medications as prescribed?  Yes    Date Diagnosed  last week      Health Coping   How would you rate your overall health?  Good      Psychosocial Assessment   Patient Belief/Attitude about Diabetes  Motivated to manage diabetes    Self-care barriers  None    Self-management support  Doctor's office;Family    Patient Concerns  Nutrition/Meal planning;Glycemic Control    Special Needs  None    Preferred Learning Style  Visual;Hands on    Learning Readiness  Change in progress    How often do you need to have someone help you when you read instructions, pamphlets, or other written materials from your doctor or pharmacy?  1 - Never    What is the last grade level you completed in school?  some college      Pre-Education Assessment   Patient understands the diabetes disease and treatment process.  Needs Instruction    Patient understands incorporating nutritional management into lifestyle.  Needs Instruction    Patient undertands incorporating physical activity into lifestyle.  Needs Review    Patient understands using medications safely.  Needs Instruction    Patient understands monitoring blood glucose, interpreting and using results  Needs Review    Patient understands prevention, detection, and treatment of acute complications.  Needs  Instruction    Patient understands prevention, detection, and treatment of chronic complications.  Needs Instruction    Patient understands how to develop strategies to address psychosocial issues.  Needs Instruction    Patient understands how to develop strategies to promote health/change behavior.  Needs Instruction      Complications   Last HgB A1C per patient/outside source  5.2 % 06/19/17    How often do you check your blood sugar?  3-4 times/day    Fasting Blood glucose range (mg/dL)  70-129 FBG's 92-115 mg/dL    Postprandial Blood glucose range (mg/dL)  70-129;130-179 pp's 92-155 mg/dL    Have you had a dilated eye exam in the past 12 months?  Yes    Have you had a dental exam in the past 12 months?  Yes    Are you checking your feet?  No      Dietary Intake   Breakfast  usually skips but has been eating a sausage biscuit; smoothie weekly    Snack (morning)  fruit    Lunch  mac-n-cheese; microwave meal    Snack (afternoon)  fruit    Dinner  chicken, beef, pork, fish - bread, corn, green beans, pasta, asparagus, broccolii, occasional salads    Beverage(s)  water, juice Hawaiian fruit punch      Exercise   Exercise Type  Light (walking / raking leaves)    How many days per  week to you exercise?  4    How many minutes per day do you exercise?  30    Total minutes per week of exercise  120      Patient Education   Previous Diabetes Education  No    Disease state   Definition of diabetes, type 1 and 2, and the diagnosis of diabetes    Nutrition management   Role of diet in the treatment of diabetes and the relationship between the three main macronutrients and blood glucose level;Carbohydrate counting;Reviewed blood glucose goals for pre and post meals and how to evaluate the patients' food intake on their blood glucose level.    Physical activity and exercise   Role of exercise on diabetes management, blood pressure control and cardiac health.    Monitoring  Taught/discussed  recording of test results and interpretation of SMBG.;Identified appropriate SMBG and/or A1C goals.;Ketone testing, when, how.    Chronic complications  Relationship between chronic complications and blood glucose control    Psychosocial adjustment  Identified and addressed patients feelings and concerns about diabetes    Preconception care  Pregnancy and GDM  Role of pre-pregnancy blood glucose control on the development of the fetus;Role of family planning for patients with diabetes;Reviewed with patient blood glucose goals with pregnancy      Individualized Goals (developed by patient)   Reducing Risk  Improve blood sugars     Outcomes   Expected Outcomes  Demonstrated interest in learning. Expect positive outcomes    Future DMSE  2 wks       Individualized Plan for Diabetes Self-Management Training:   Learning Objective:  Patient will have a greater understanding of diabetes self-management. Patient education plan is to attend individual and/or group sessions per assessed needs and concerns.   Plan:   Patient Instructions  Read booklet on Gestational Diabetes Follow Gestational Meal Planning Guidelines Avoid fruit juices and sugar-sweetened drinks Limit fried foods Don't skip meals Complete a 3 Day Food Record and bring to next appointment Check blood sugars 4 x day - before breakfast and 2 hrs after every meal and record  Bring blood sugar log to all appointments Purchase urine ketone strips if blood sugars not in control and check urine ketones every am:  If + increase bedtime snack to 1 protein and 2 carbohydrate servings Walk 20-30 minutes at least 5 x week if permitted by MD  Expected Outcomes:  Demonstrated interest in learning. Expect positive outcomes  Education material provided:  Gestational Booklet Gestational Meal Planning Guidelines Simple Meal Plan Viewed Gestational Diabetes Video 3 Day Food Record Goals for a Healthy Pregnancy  If problems or questions,  patient to contact team via:  Johny Drilling, [place], [place], CDE [phone]  Future DSME appointment: 2 wks  December 13, 2017 with the dietitian

## 2017-11-29 ENCOUNTER — Ambulatory Visit (INDEPENDENT_AMBULATORY_CARE_PROVIDER_SITE_OTHER): Payer: BLUE CROSS/BLUE SHIELD | Admitting: Obstetrics and Gynecology

## 2017-11-29 ENCOUNTER — Other Ambulatory Visit (HOSPITAL_COMMUNITY)
Admission: RE | Admit: 2017-11-29 | Discharge: 2017-11-29 | Disposition: A | Discharge status: Home / Self Care | Payer: BLUE CROSS/BLUE SHIELD | Source: Ambulatory Visit | Attending: Obstetrics and Gynecology | Admitting: Obstetrics and Gynecology

## 2017-11-29 ENCOUNTER — Encounter: Payer: Self-pay | Admitting: Obstetrics and Gynecology

## 2017-11-29 VITALS — BP 110/64 | Wt 213.0 lb

## 2017-11-29 DIAGNOSIS — Z3A30 30 weeks gestation of pregnancy: Secondary | ICD-10-CM

## 2017-11-29 DIAGNOSIS — Z331 Pregnant state, incidental: Secondary | ICD-10-CM

## 2017-11-29 DIAGNOSIS — N760 Acute vaginitis: Secondary | ICD-10-CM | POA: Insufficient documentation

## 2017-11-29 DIAGNOSIS — B373 Candidiasis of vulva and vagina: Secondary | ICD-10-CM | POA: Diagnosis not present

## 2017-11-29 DIAGNOSIS — B3731 Acute candidiasis of vulva and vagina: Secondary | ICD-10-CM

## 2017-11-29 DIAGNOSIS — O0991 Supervision of high risk pregnancy, unspecified, first trimester: Secondary | ICD-10-CM

## 2017-11-29 DIAGNOSIS — O24410 Gestational diabetes mellitus in pregnancy, diet controlled: Secondary | ICD-10-CM

## 2017-11-29 MED ORDER — TERCONAZOLE 0.4 % VA CREA: 1.0000 | TOPICAL_CREAM | Freq: Every day | VAGINAL | 0 refills | Status: DC

## 2017-11-29 NOTE — Progress Notes (Signed)
Patient ID: Angela Mccormick, female   DOB: Jan 10, 1986, 32 y.o.   MRN: [id number]  Reason for Consult: No chief complaint on file.   Referred by Glori LuisSonnenberg, Eric G, MD  Subjective:     HPI:  Angela Mccormick is a 32 y.o. female  She is being seen today for a problem visit related to pain with intercourse. She reports that with intercourse she is having a burning pain during and for about 30 minutes afterwards. She has not noticed any abnormal discharge. She would like to be screened for STIs.   Past Medical History:  Diagnosis Date  . Asthma   . Depression   . Gestational diabetes   . History of Chiari malformation   . History of syncope   . Migraine    Family History  Problem Relation Age of Onset  . Colon cancer Mother   . Heart disease Mother   . Diabetes Mother   . Diabetes Father    Past Surgical History:  Procedure Laterality Date  . NO PAST SURGERIES      Short Social History:  Social History   Tobacco Use  . Smoking status: Never Smoker  . Smokeless tobacco: Never Used  Substance Use Topics  . Alcohol use: No    Frequency: Never    Allergies  Allergen Reactions  . Sumatriptan Succinate Anxiety    Current Outpatient Medications  Medication Sig Dispense Refill  . ACCU-CHEK FASTCLIX LANCETS MISC 1 each by Percutaneous route 4 (four) times daily. 100 each 12  . albuterol (PROVENTIL HFA;VENTOLIN HFA) 108 (90 Base) MCG/ACT inhaler Inhale 2 puffs into the lungs every 6 (six) hours as needed for wheezing or shortness of breath. 18 g 1  . Doxylamine-Pyridoxine ER (BONJESTA) 20-20 MG TBCR Take 1 tablet by mouth 2 (two) times daily. (Patient not taking: Reported on 11/28/2017) 60 tablet 2  . glucose blood (ACCU-CHEK GUIDE) test strip Use as instructed 100 each 12  . Misc. Devices (BREAST PUMP) MISC Dispense one breast pump for patient (Patient not taking: Reported on 11/28/2017) 1 each 0  . prenatal vitamin w/FE, FA (NATACHEW) 29-1 MG CHEW chewable tablet Chew 1  tablet by mouth daily at 12 noon.     No current facility-administered medications for this visit.     Review of Systems  Constitutional: Negative for chills, fatigue, fever and unexpected weight change.  HENT: Negative for trouble swallowing.  Eyes: Negative for loss of vision.  Respiratory: Negative for cough, shortness of breath and wheezing.  Cardiovascular: Negative for chest pain, leg swelling, palpitations and syncope.  GI: Negative for abdominal pain, blood in stool, diarrhea, nausea and vomiting.  GU: Negative for difficulty urinating, dysuria, frequency and hematuria.  Musculoskeletal: Negative for back pain, leg pain and joint pain.  Skin: Negative for rash.  Neurological: Negative for dizziness, headaches, light-headedness, numbness and seizures.  Psychiatric: Negative for behavioral problem, confusion, depressed mood and sleep disturbance.        Objective:  Objective   Vitals:   11/29/17 1615  BP: 110/64  Weight: 213 lb (96.6 kg)   Body mass index is 37.73 kg/m.  Physical Exam  Constitutional: She is oriented to person, place, and time. She appears well-developed and well-nourished.  HENT:  Head: Normocephalic and atraumatic.  Eyes: Pupils are equal, round, and reactive to light. EOM are normal.  Cardiovascular: Normal rate, regular rhythm and normal heart sounds.  Pulmonary/Chest: Effort normal and breath sounds normal.  Abdominal: Soft. She exhibits no  distension. There is no tenderness. There is no guarding.  Genitourinary: Vaginal discharge found.  Neurological: She is alert and oriented to person, place, and time.  Skin: Skin is warm and dry.  Psychiatric: She has a normal mood and affect. Her behavior is normal. Judgment and thought content normal.  Nursing note and vitals reviewed.  Mellody Drown Prep: PH: normal Clue Cells: Negative Fungal elements: Positive Trichomonas: Negative      Assessment/Plan:     32 yo with yeast vaginitis 1. Yeast present  on wet mount, will treat with Terconazole. Nuswab sent for STD screening at patient's request.  Blood glucose was elevated at her last appointment per Dr. Johnathan Hausen note. May need to be started on insulin given this opportunistic infection. Please review blood glucose log at her next visit.    [name] MD [facility] 11/29/17 5:44 PM

## 2017-11-29 NOTE — Progress Notes (Signed)
Work in [employer] Painful intercourse no discharge

## 2017-11-29 NOTE — Telephone Encounter (Signed)
Called and left voice mail for patient to call back to be schedule °

## 2017-12-03 ENCOUNTER — Other Ambulatory Visit: Payer: Self-pay | Admitting: Obstetrics and Gynecology

## 2017-12-03 DIAGNOSIS — B9689 Other specified bacterial agents as the cause of diseases classified elsewhere: Secondary | ICD-10-CM

## 2017-12-03 DIAGNOSIS — N760 Acute vaginitis: Principal | ICD-10-CM

## 2017-12-03 LAB — CERVICOVAGINAL ANCILLARY ONLY
Bacterial vaginitis: POSITIVE — AB
Candida vaginitis: POSITIVE — AB
Chlamydia: NEGATIVE
Neisseria Gonorrhea: NEGATIVE
Trichomonas: NEGATIVE

## 2017-12-03 MED ORDER — METRONIDAZOLE 0.75 % VA GEL: 1.0000 | Freq: Every day | VAGINAL | 1 refills | Status: AC

## 2017-12-03 NOTE — Progress Notes (Signed)
BV and yeast, prescriptions sent, note sent to mychart.

## 2017-12-05 ENCOUNTER — Ambulatory Visit (INDEPENDENT_AMBULATORY_CARE_PROVIDER_SITE_OTHER): Payer: BLUE CROSS/BLUE SHIELD | Admitting: Advanced Practice Midwife

## 2017-12-05 ENCOUNTER — Encounter: Payer: Self-pay | Admitting: Advanced Practice Midwife

## 2017-12-05 VITALS — BP 108/64 | Wt 218.0 lb

## 2017-12-05 DIAGNOSIS — Z3A31 31 weeks gestation of pregnancy: Secondary | ICD-10-CM

## 2017-12-05 DIAGNOSIS — O24414 Gestational diabetes mellitus in pregnancy, insulin controlled: Secondary | ICD-10-CM

## 2017-12-05 MED ORDER — INSULIN ASPART 100 UNIT/ML FLEXPEN: 5.0000 [IU] | PEN_INJECTOR | Freq: Three times a day (TID) | SUBCUTANEOUS | 5 refills | Status: DC

## 2017-12-05 MED ORDER — INSULIN DETEMIR 100 UNIT/ML FLEXPEN: 15.0000 [IU] | PEN_INJECTOR | Freq: Every day | SUBCUTANEOUS | 5 refills | Status: DC

## 2017-12-05 NOTE — Patient Instructions (Signed)
Insulin Treatment for Diabetes °Diabetes (diabetes mellitus) is a long-term (chronic) disease. It occurs when the body does not properly use sugar (glucose) that is released from food after digestion. Glucose levels are controlled by a hormone called insulin, which is made in the pancreas. °· If you have type 1 diabetes, the pancreas does not make any insulin, so you must take insulin. °· If you have type 2 diabetes, you might need to take insulin along with other medicines. In type 2 diabetes, one or both of these problems may be present: °? The pancreas does not make enough insulin. °? Cells in the body do not respond properly to insulin that the body makes (insulin resistance). ° °You must use insulin correctly to control your diabetes. You must have some insulin in your body at all times. Insulin treatment varies depending on your type of diabetes, your treatment goals, and your medical history. It is important for you to understand your insulin treatment plan so you can be an active partner in managing your diabetes. °How is insulin given? °Insulin can only be given through a shot (injection). It is injected using a syringe and needle, an insulin pen, a pump, or a jet injector. Your health care provider will: °· Prescribe the amount and type of insulin that you need. °· Tell you when you should inject your insulin. ° °Where on the body should insulin be injected? °Insulin is injected into a layer of fatty tissue under the skin. Good places to inject insulin include: °· Abdomen. Generally, the abdomen is the best place to inject insulin. However, you should avoid any area that is less than 2 inches (5 cm) from the belly button (navel). °· Front and outer area of the upper thighs. °· The back of the upper arms. °· Upper buttocks. ° °It is important to: °· Give your injection in a slightly different place each time. This helps to prevent irritation and improve absorption. °· Avoid injecting into areas that have  scar tissue. ° °Usually, you will give yourself insulin injections. Others can also be taught how to give you injections. You will use a special type of syringe that is made only for insulin. Some people may have an insulin pump that delivers insulin steadily through a tube (cannula) that is placed under the skin. °What are the different types of insulin? °The following information is a general guide to different types of insulin. Specifics vary depending on the insulin product that your health care provider prescribes. °· Rapid-acting insulin: °? Starts working quickly, in as little as 5 minutes. °? Can last for 4-6 hours, or sometimes longer. °? Works well when taken right before a meal to quickly lower blood glucose. °· Short-acting insulin: °? Starts working in about 30 minutes. °? Can last for 6-10 hours. °? Should be taken about 30 minutes before you start eating a meal. °· Intermediate-acting insulin: °? Starts working in 1-2 hours. °? Lasts for about 10-18 hours. °? Lowers your blood glucose for a longer period of time but is not as effective for lowering blood glucose right after a meal. °· Long-acting insulin: °? Mimics the small amount of insulin that your pancreas usually produces throughout the day. °? Should be used either one or two times a day. °? Is usually used in combination with other types of insulin or other medicines. °· Concentrated insulin, or U-500 insulin: °? Contains a higher dose of insulin than most rapid-acting insulins. U-500 insulin has 5 times the amount   of insulin per 1 mL. °? Should only be used with the special U-500 syringe or U-500 insulin pen. It is dangerous to use the wrong type of syringe with this insulin. ° °What are the side effects of insulin? °Possible side effects of insulin treatment include: °· Low blood glucose (hypoglycemia). °· Weight gain. °· High blood glucose (hyperglycemia). °· Skin injury or irritation. ° °Some of these side effects can be caused by using  improper injection technique. It is important to learn to inject insulin properly. °What are common terms associated with insulin treatment? °Some terms that you might hear include: °· Basal insulin, or basal rate. This is the constant amount of insulin that needs to be present in your body to stabilize your blood glucose levels. People who have type 1 diabetes need basal insulin in a steady (continuous) dose 24 hours a day. °? Usually, intermediate-acting or long-acting insulin is used one or two times a day to manage basal insulin levels. °? Medicines that are taken by mouth may also be recommended to manage basal insulin levels. °· Prandial insulin. This refers to meal-related insulin. °? Blood glucose rises quickly after a meal (postprandial). Rapid-acting or short-acting insulin can be used right before a meal (preprandial) to quickly lower blood glucose. °? You may be instructed to adjust the amount of prandial insulin that you take depending on how much carbohydrate (starch) is in your meal. °· Corrective insulin. This may also be called a correction dose or supplemental dose. This is a small amount of rapid-acting or short-acting insulin that can be used to lower blood glucose if it is too high. You may be instructed to check your blood glucose at certain times of the day and use corrective insulin as needed. °· Tight control, or intensive therapy. This means keeping your blood glucose as close to your target as possible, and preventing it from getting too high after meals. People who have tight control of their diabetes have fewer long-term problems caused by diabetes. ° °General instructions ° °Talk with your health care provider or pharmacist about the type of insulin you should take and when you should take it. You should know when your insulin peaks and when it wears off. You need this information so you can plan your meals and exercise. You also need to work with your health care provider to: °· Check  your blood glucose every day. Your health care provider will tell you how often and when you should do this. °· Manage your: °? Weight. °? Blood pressure. °? Cholesterol. °? Stress. °· Eat a healthy diet. °· Exercise regularly. ° °This information is not intended to replace advice given to you by your health care provider. Make sure you discuss any questions you have with your health care provider. °Document Released: 07/28/2008 Document Revised: 10/07/2015 Document Reviewed: 06/04/2015 °Elsevier Interactive Patient Education © 2018 [employer] ° °

## 2017-12-05 NOTE — Progress Notes (Signed)
Routine Prenatal Care Visit  Subjective  Angela Mccormick is a 32 y.o. G1P0000 at 4360w4d being seen today for ongoing prenatal care.  She is currently monitored for the following issues for this high-risk pregnancy and has Chiari malformation (HCC); Migraines; Syncope; Nodule of neck; Elevated glucose; BMI 37.0-37.9, adult; Supervision of high risk pregnancy in first trimester; Obesity affecting pregnancy, antepartum; Nausea and vomiting during pregnancy; and Diet controlled gestational diabetes mellitus (GDM) in third trimester on their problem list.  ----------------------------------------------------------------------------------- Patient reports no complaints.  She is currently taking the metrogel for BV and when she is finished with that she will take the Terconazole for yeast infection. Review of blood sugar log since last Thursday: Fasting are all elevated in 90's/100 except for this morning was 90. After breakfast and after lunch are normal. After dinner had 3 elevated in 130's/140's. She does not know why those are different as she admits to eating the same thing. She admits a snack at bedtime including 1% milk. [place]. Will discuss all of this with Dr Jerene PitchSchuman and make a decision about starting insulin.  Contractions: Not present. Vag. Bleeding: None.  Movement: Present. Denies leaking of fluid.  ----------------------------------------------------------------------------------- The following portions of the patient's history were reviewed and updated as appropriate: allergies, current medications, past family history, past medical history, past social history, past surgical history and problem list. Problem list updated.   Objective  Blood pressure 108/64, weight 218 lb (98.9 kg), last menstrual period 04/28/2017. Pregravid weight 197 lb (89.4 kg) Total Weight Gain 21 lb (9.526 kg) Urinalysis: Urine Protein: Negative Urine Glucose:  Negative  Fetal Status: Fetal Heart Rate (bpm): 136   Movement: Present     General:  Alert, oriented and cooperative. Patient is in no acute distress.  Skin: Skin is warm and dry. No rash noted.   Cardiovascular: Normal heart rate noted  Respiratory: Normal respiratory effort, no problems with respiration noted  Abdomen: Soft, gravid, appropriate for gestational age. Pain/Pressure: Absent     Pelvic:  Cervical exam deferred        Extremities: Normal range of motion.  Edema: None  Mental Status: Normal mood and affect. Normal behavior. Normal judgment and thought content.   Assessment   32 y.o. G1P0000 at 8460w4d by  02/02/2018, by Last Menstrual Period presenting for routine prenatal visit  Plan   Pregnancy#1 Problems (from 04/28/17 to present)    Problem Noted Resolved   Diet controlled gestational diabetes mellitus (GDM) in third trimester 11/21/2017 by Natale MilchSchuman, Christanna R, MD No   Overview Signed 11/21/2017  4:38 PM by Natale MilchSchuman, Christanna R, MD    Current Diabetic Medications: None [ ]  Aspirin 81 mg daily after 12 weeks; discontinue after 36 weeks (? A2/B GDM)  Required Referrals for A1GDM or A2GDM: [ x] Diabetes Education and Child psychotherapistTesting Supplies [ x] Nutrition Cousult  For A2/B GDM or higher classes of DM [ ]  Diabetes Education and Testing Supplies [ ]  Nutrition Counsult [ ]  Fetal ECHO after 22-24 weeks  [ ]  Eye exam for retina evaluation  [ ]  Baseline EKG [ ]  US fetal growth every 4 weeks starting at 28 weeks [ ]  Twice weekly NST starting at [redacted] weeks gestation [ ]  Delivery planning contingent on fetal growth, AFI, glycemic control, and other co-morbidities but at least by 39 weeks  [facility] and [facility] refresh links as needed)  Lab Results  Component Value Date  CREATININE 0.73 04/13/2017   AST 18 04/13/2017   ALT 20 04/13/2017   Lab Results  Component Value Date   HGBA1C 5.2 06/19/2017   HGBA1C 5.2 04/13/2017   HGBA1C 5.3  10/31/2016    Antenatal Testing Class of DM U/S NST/AFI DELIVERY  Diabetes   A1 - good control - O24.410    A2 - good control - O24.419      A2  - poor control or poor compliance - O24.419, E11.65   (Macrosomia or polyhydramnios) **E11.65 is extra code for poor control**    A2/B - O24.919  and B-C O24.319  Poor control B-C or D-R-F-T - O24.319  or  Type I DM - O24.019  20-38  20-38  20-24-28-32-36   20-24-28-32-35-38//fetal echo  20-24-27-30-33-36-38//fetal echo  40  32//2 x wk  32//2 x wk   32//2 x wk  28//BPP wkly then 32//2 x wk  40  39  PRN   39  PRN          Nausea and vomiting during pregnancy 08/13/2017 by Conard Novak, MD No   Supervision of high risk pregnancy in first trimester 06/19/2017 by Vena Austria, MD No   Overview Addendum 11/21/2017  4:02 PM by Natale Milch, MD    Clinic [facility] Prenatal Labs  Dating L=8 Blood type: A pos   Genetic Screen Declined Antibody: negative  Anatomic [place] Normal, complete Rubella: Immune Varicella: Immune  GTT HgbA1C 5.2   Third trimester: ELEVATED RPR: NR  Rhogam Not applicable HBsAg: Negative  TDaP vaccine                        Flu Shot: HIV: Negative  Baby Food                                GBS:   Contraception  Pap: NIL HPV negative 06/19/17  CBB     CS/VBAC    Support Person           Obesity affecting pregnancy, antepartum 06/19/2017 by Vena Austria, MD No   Overview Signed 10/24/2017  6:19 PM by Natale Milch, MD    Growth [place] at  [ ] 28 [ ] 32 [ ] 36       BMI 37.0-37.9, adult 04/13/2017 by Glori Luis, MD No      Discussed starting insulin with Dr Jerene Pitch with recommendation of:  15 units Levemir at HS 5 units Novolog before meals  Preterm labor symptoms and general obstetric precautions including but not limited to vaginal bleeding, contractions, leaking of fluid and fetal movement were reviewed in detail with the patient.   Patient has follow up  visit scheduled  Tresea Mall, [facility] 12/05/2017 2:19 PM

## 2017-12-06 ENCOUNTER — Encounter: Payer: Self-pay | Admitting: Advanced Practice Midwife

## 2017-12-07 ENCOUNTER — Telehealth: Payer: Self-pay

## 2017-12-07 NOTE — Telephone Encounter (Signed)
[facility] sent PA request for:  insulin aspart (NOVOLOG) 100 UNIT/ML FlexPen  PA not required for med per CoverMyMeds Key#[id number]

## 2017-12-10 ENCOUNTER — Encounter
Admission: RE | Admit: 2017-12-10 | Discharge: 2017-12-10 | Disposition: A | Discharge status: Home / Self Care | Payer: BLUE CROSS/BLUE SHIELD | Source: Ambulatory Visit | Attending: Anesthesiology | Admitting: Anesthesiology

## 2017-12-10 ENCOUNTER — Other Ambulatory Visit: Payer: BLUE CROSS/BLUE SHIELD

## 2017-12-11 ENCOUNTER — Other Ambulatory Visit: Payer: Self-pay | Admitting: Obstetrics & Gynecology

## 2017-12-11 DIAGNOSIS — Q0701 Arnold-Chiari syndrome with spina bifida: Secondary | ICD-10-CM

## 2017-12-11 NOTE — Pre-Procedure Instructions (Signed)
SEE ANES. NOTE 12/11/17. Angela Mccormick  AT [facility] NOTIFIED PATIENT NEEDS NEURO CLEARANCE SO ANES WILL HAVE INFO TO COMPLTE CONSULT

## 2017-12-11 NOTE — Anesthesia Pain Management Evaluation Note (Signed)
NANCY SURGERY COORDINATOR AT [facility] NOTIFIED DR Henrene HawkingKEPHART ON 12/10/17 WANTS PATIENT TO HAVE NEURO CLEARANCE AND HAVE THAT INFO AVALABLE TO COMPLETE ANES. CONSULT

## 2017-12-12 ENCOUNTER — Ambulatory Visit (INDEPENDENT_AMBULATORY_CARE_PROVIDER_SITE_OTHER): Payer: BLUE CROSS/BLUE SHIELD

## 2017-12-12 ENCOUNTER — Encounter: Payer: Self-pay | Admitting: Maternal Newborn

## 2017-12-12 ENCOUNTER — Ambulatory Visit (INDEPENDENT_AMBULATORY_CARE_PROVIDER_SITE_OTHER): Payer: BLUE CROSS/BLUE SHIELD | Admitting: Maternal Newborn

## 2017-12-12 ENCOUNTER — Telehealth: Payer: Self-pay

## 2017-12-12 VITALS — BP 120/70 | Wt 213.8 lb

## 2017-12-12 DIAGNOSIS — G935 Compression of brain: Secondary | ICD-10-CM | POA: Diagnosis not present

## 2017-12-12 DIAGNOSIS — Q0700 Arnold-Chiari syndrome without spina bifida or hydrocephalus: Secondary | ICD-10-CM

## 2017-12-12 DIAGNOSIS — Z3A32 32 weeks gestation of pregnancy: Secondary | ICD-10-CM

## 2017-12-12 DIAGNOSIS — O24410 Gestational diabetes mellitus in pregnancy, diet controlled: Secondary | ICD-10-CM

## 2017-12-12 DIAGNOSIS — Q0701 Arnold-Chiari syndrome with spina bifida: Secondary | ICD-10-CM

## 2017-12-12 DIAGNOSIS — O24414 Gestational diabetes mellitus in pregnancy, insulin controlled: Secondary | ICD-10-CM

## 2017-12-12 DIAGNOSIS — O0991 Supervision of high risk pregnancy, unspecified, first trimester: Secondary | ICD-10-CM

## 2017-12-12 DIAGNOSIS — Z369 Encounter for antenatal screening, unspecified: Secondary | ICD-10-CM

## 2017-12-12 MED ORDER — INSULIN LISPRO 100 UNIT/ML CARTRIDGE: 5.0000 [IU] | Freq: Three times a day (TID) | SUBCUTANEOUS | 3 refills | Status: DC

## 2017-12-12 NOTE — Progress Notes (Signed)
Routine Prenatal Care Visit  Subjective  Angela Mccormick is a 32 y.o. G1P0000 at [redacted]w[redacted]d being seen today for ongoing prenatal care.  She is currently monitored for the following issues for this high-risk pregnancy and has Chiari malformation (HCC); Migraines; Syncope; Nodule of neck; Elevated glucose; BMI 37.0-37.9, adult; Supervision of high risk pregnancy in first trimester; Obesity affecting pregnancy, antepartum; Nausea and vomiting during pregnancy; and Gestational diabetes mellitus (GDM) on their problem list.  ----------------------------------------------------------------------------------- Patient reports no complaints.   Contractions: Not present. Vag. Bleeding: None.  Movement: Present. No leaking of fluid.  ----------------------------------------------------------------------------------- The following portions of the patient's history were reviewed and updated as appropriate: allergies, current medications, past family history, past medical history, past social history, past surgical history and problem list. Problem list updated.   Objective  Blood pressure 120/70, weight 213 lb 12 oz (97 kg), last menstrual period 04/28/2017. Pregravid weight 197 lb (89.4 kg) Total Weight Gain 16 lb 12 oz (7.598 kg) Urinalysis: Urine Protein: Negative Urine Glucose: 4+  Fetal Status: Fetal Heart Rate (bpm): 142 Fundal Height: 39 cm Movement: Present     General:  Alert, oriented and cooperative. Patient is in no acute distress.  Skin: Skin is warm and dry. No rash noted.   Cardiovascular: Normal heart rate noted  Respiratory: Normal respiratory effort, no problems with respiration noted  Abdomen: Soft, gravid, appropriate for gestational age. Pain/Pressure: Absent     Pelvic:  Cervical exam deferred        Extremities: Normal range of motion.  Edema: None  Mental Status: Normal mood and affect. Normal behavior. Normal judgment and thought content.     Assessment   32 y.o.  G1P0000 at [redacted]w[redacted]d, EDD 02/02/2018 by Last Menstrual Period presenting for routine prenatal visit.  Plan   Pregnancy#1 Problems (from 04/28/17 to present)    Problem Noted Resolved   Diet controlled gestational diabetes mellitus (GDM) in third trimester 11/21/2017 by Natale Milch, MD No   Overview Signed 11/21/2017  4:38 PM by Natale Milch, MD    Current Diabetic Medications: None [ ]  Aspirin 81 mg daily after 12 weeks; discontinue after 36 weeks (? A2/B GDM)  Required Referrals for A1GDM or A2GDM: [ x] Diabetes Education and Child psychotherapist [ x] Nutrition Cousult  For A2/B GDM or higher classes of DM [ ]  Diabetes Education and Testing Supplies [ ]  Nutrition Counsult [ ]  Fetal ECHO after 22-24 weeks  [ ]  Eye exam for retina evaluation  [ ]  Baseline EKG [ ]  US fetal growth every 4 weeks starting at 28 weeks [ ]  Twice weekly NST starting at [redacted] weeks gestation [ ]  Delivery planning contingent on fetal growth, AFI, glycemic control, and other co-morbidities but at least by 39 weeks  Baseline and surveillance labs (pulled in from [facility], refresh links as needed)  Lab Results  Component Value Date   CREATININE 0.73 04/13/2017   AST 18 04/13/2017   ALT 20 04/13/2017   Lab Results  Component Value Date   HGBA1C 5.2 06/19/2017   HGBA1C 5.2 04/13/2017   HGBA1C 5.3 10/31/2016    Antenatal Testing Class of DM U/S NST/AFI DELIVERY  Diabetes   A1 - good control - O24.410    A2 - good control - O24.419      A2  - poor control or poor compliance - O24.419, E11.65   (Macrosomia or polyhydramnios) **E11.65 is extra code for poor control**    A2/B - O24.919  and  B-C O24.319  Poor control B-C or D-R-F-T - O24.319  or  Type I DM - O24.019  20-38  20-38  20-24-28-32-36   20-24-28-32-35-38//fetal echo  20-24-27-30-33-36-38//fetal echo  40  32//2 x wk  32//2 x wk   32//2 x wk  28//BPP wkly then 32//2 x wk  40  39  PRN   39  PRN           Nausea and vomiting during pregnancy 08/13/2017 by Conard NovakJackson, Stephen D, MD No   Supervision of high risk pregnancy in first trimester 06/19/2017 by [name], Andreas, MD No   Overview Addendum 11/21/2017  4:02 PM by Natale MilchSchuman, Christanna R, MD    [facility] Prenatal Labs  Dating L=8 Blood type: A pos   Genetic Screen Declined Antibody: negative  Anatomic US Normal, complete Rubella: Immune Varicella: Immune  GTT HgbA1C 5.2   Third trimester: ELEVATED RPR: NR  Rhogam Not applicable HBsAg: Negative  TDaP vaccine                        Flu Shot: HIV: Negative  Baby Food                                GBS:   Contraception  Pap: NIL HPV negative 06/19/17  CBB     CS/VBAC    Support Person           Obesity affecting pregnancy, antepartum 06/19/2017 by [name], Andreas, MD No   Overview Signed 10/24/2017  6:19 PM by Natale MilchSchuman, Christanna R, MD    Growth US at  [ ] 28 [ ] 32 [ ] 36       BMI 37.0-37.9, adult 04/13/2017 by Glori LuisSonnenberg, Eric G, MD No    Growth ultrasound today at 72nd percentile, EFW 5 lb 5 oz, AFI 13.37 cm.   She is only taking Levemir right now because Novolog was not covered by insurance, and the pharmacy told her they didn't receive Rx for Humalog. Resent Rx for Humalog today, and she will let us know if she has problems with the pharmacy.  Reviewed glucose logs. Fasting values 91-109; 5/7 elevated. Postprandial values 88-158; 7/18 elevated, all after dinner except for one after lunch. Should improve with addition of Humalog.  Reviewed plan of care to begin twice weekly APT.  Preterm labor symptoms and general obstetric precautions  were reviewed.  Return in about 5 days (around 12/17/2017) for ROB with NST.  Marcelyn BruinsJacelyn Dayron Odland, CNM 12/12/2017  2:40 PM

## 2017-12-12 NOTE — Telephone Encounter (Signed)
[facility] CB# [phone]

## 2017-12-12 NOTE — Telephone Encounter (Signed)
Left message on patient's voicemail, will try to call again after hours if no call back.

## 2017-12-12 NOTE — Progress Notes (Signed)
No concerns.rj 

## 2017-12-13 ENCOUNTER — Other Ambulatory Visit: Payer: Self-pay | Admitting: Maternal Newborn

## 2017-12-13 ENCOUNTER — Encounter: Payer: BLUE CROSS/BLUE SHIELD | Attending: Advanced Practice Midwife | Admitting: Dietician

## 2017-12-13 ENCOUNTER — Encounter: Payer: Self-pay | Admitting: Dietician

## 2017-12-13 VITALS — BP 110/70 | Ht 63.0 in | Wt 211.4 lb

## 2017-12-13 DIAGNOSIS — Z713 Dietary counseling and surveillance: Secondary | ICD-10-CM | POA: Insufficient documentation

## 2017-12-13 DIAGNOSIS — Z3A00 Weeks of gestation of pregnancy not specified: Secondary | ICD-10-CM | POA: Diagnosis not present

## 2017-12-13 DIAGNOSIS — O24414 Gestational diabetes mellitus in pregnancy, insulin controlled: Secondary | ICD-10-CM

## 2017-12-13 DIAGNOSIS — O24419 Gestational diabetes mellitus in pregnancy, unspecified control: Secondary | ICD-10-CM | POA: Insufficient documentation

## 2017-12-13 MED ORDER — INSULIN LISPRO 100 UNIT/ML (KWIKPEN): 5.0000 [IU] | PEN_INJECTOR | Freq: Three times a day (TID) | SUBCUTANEOUS | 3 refills | Status: DC

## 2017-12-13 NOTE — Patient Instructions (Signed)
   Continue to keep up with carb amounts eaten, and work to get close to 45grams with each meal.   If needed, increase protein portions and "free" veggies to feel full without overeating the starchy foods/ carbs.   IF blood sugar drops low, drink 4oz of a regular sugar-sweetened drink, allow 10-15 minutes to increase sugar, then follow with a meal or snack that contains protein within 30 minutes.

## 2017-12-13 NOTE — Progress Notes (Signed)
   Patient's BG record indicates fasting BGs ranging 90-109, and post-meal BGs ranging 85-134 after breakfast, 102-136 after lunch, and 97-160 after supper. She is taking 15 units of Levemir insulin daily at bedtime, but hs not yet begun taking Humalog due to difficulty obtaining from pharmacy.   Discussed effects of Humalog, quick acting insulin, and possibility of low blood sugar reactions. Instructed patient on symptoms of and treatment for hypoglycemia.  Patient's food diary indicates she is eating 3 meals and averaging 1 snack daily. She has worked to reduce sugar and carb intake from sweetened beverages and foods but it taking in fluctuating amounts. She is tracking her carb intake using a phone app.   Provided 1700kcal meal plan, and wrote individualized menus based on patient's food preferences. Encouraged her to work on consistent carb intake, ideally close to 45grams each meal.  Instructed patient on food safety, including avoidance of Listeriosis, and limiting mercury from fish.  Discussed importance of maintaining healthy lifestyle habits to reduce risk of Type 2 DM as well as Gestational DM with any future pregnancies.  Advised patient to use any remaining testing supplies to test some BGs after delivery, and to have BG tested ideally annually, as well as prior to attempting future pregnancies.

## 2017-12-13 NOTE — Progress Notes (Signed)
Rx for Humalog changed to pen.

## 2017-12-14 ENCOUNTER — Encounter: Payer: BLUE CROSS/BLUE SHIELD | Admitting: Family Medicine

## 2017-12-15 ENCOUNTER — Other Ambulatory Visit: Payer: Self-pay

## 2017-12-15 ENCOUNTER — Inpatient Hospital Stay
Admission: EM | Admit: 2017-12-15 | Discharge: 2017-12-20 | DRG: 786 | Disposition: A | Discharge status: Home / Self Care | Payer: BLUE CROSS/BLUE SHIELD | Attending: Obstetrics and Gynecology | Admitting: Obstetrics and Gynecology

## 2017-12-15 ENCOUNTER — Encounter: Payer: Self-pay | Admitting: *Deleted

## 2017-12-15 DIAGNOSIS — O42913 Preterm premature rupture of membranes, unspecified as to length of time between rupture and onset of labor, third trimester: Secondary | ICD-10-CM | POA: Diagnosis not present

## 2017-12-15 DIAGNOSIS — E669 Obesity, unspecified: Secondary | ICD-10-CM | POA: Diagnosis not present

## 2017-12-15 DIAGNOSIS — O99354 Diseases of the nervous system complicating childbirth: Secondary | ICD-10-CM | POA: Diagnosis present

## 2017-12-15 DIAGNOSIS — O0991 Supervision of high risk pregnancy, unspecified, first trimester: Secondary | ICD-10-CM

## 2017-12-15 DIAGNOSIS — Z3A33 33 weeks gestation of pregnancy: Secondary | ICD-10-CM | POA: Diagnosis not present

## 2017-12-15 DIAGNOSIS — O24424 Gestational diabetes mellitus in childbirth, insulin controlled: Secondary | ICD-10-CM | POA: Diagnosis not present

## 2017-12-15 DIAGNOSIS — O9081 Anemia of the puerperium: Secondary | ICD-10-CM | POA: Diagnosis not present

## 2017-12-15 DIAGNOSIS — O9952 Diseases of the respiratory system complicating childbirth: Secondary | ICD-10-CM | POA: Diagnosis not present

## 2017-12-15 DIAGNOSIS — O99210 Obesity complicating pregnancy, unspecified trimester: Secondary | ICD-10-CM

## 2017-12-15 DIAGNOSIS — D62 Acute posthemorrhagic anemia: Secondary | ICD-10-CM | POA: Diagnosis not present

## 2017-12-15 DIAGNOSIS — O350XX0 Maternal care for (suspected) central nervous system malformation in fetus, not applicable or unspecified: Secondary | ICD-10-CM | POA: Diagnosis not present

## 2017-12-15 DIAGNOSIS — O42113 Preterm premature rupture of membranes, onset of labor more than 24 hours following rupture, third trimester: Secondary | ICD-10-CM | POA: Diagnosis not present

## 2017-12-15 DIAGNOSIS — O42919 Preterm premature rupture of membranes, unspecified as to length of time between rupture and onset of labor, unspecified trimester: Secondary | ICD-10-CM | POA: Diagnosis present

## 2017-12-15 DIAGNOSIS — O219 Vomiting of pregnancy, unspecified: Secondary | ICD-10-CM

## 2017-12-15 DIAGNOSIS — O24414 Gestational diabetes mellitus in pregnancy, insulin controlled: Secondary | ICD-10-CM

## 2017-12-15 DIAGNOSIS — O99214 Obesity complicating childbirth: Secondary | ICD-10-CM | POA: Diagnosis present

## 2017-12-15 DIAGNOSIS — J45909 Unspecified asthma, uncomplicated: Secondary | ICD-10-CM | POA: Diagnosis present

## 2017-12-15 DIAGNOSIS — Z6837 Body mass index (BMI) 37.0-37.9, adult: Secondary | ICD-10-CM

## 2017-12-15 DIAGNOSIS — G935 Compression of brain: Secondary | ICD-10-CM | POA: Diagnosis not present

## 2017-12-15 LAB — GLUCOSE, CAPILLARY
Glucose-Capillary: 200 mg/dL — ABNORMAL HIGH (ref 70–99)
Glucose-Capillary: 134 mg/dL — ABNORMAL HIGH (ref 70–99)

## 2017-12-15 LAB — TYPE AND SCREEN
ABO/RH(D): A POS
Antibody Screen: NEGATIVE

## 2017-12-15 LAB — CBC
HCT: 35.2 % (ref 35.0–47.0)
Hemoglobin: 12.4 g/dL (ref 12.0–16.0)
MCH: 30.3 pg (ref 26.0–34.0)
MCHC: 35.2 g/dL (ref 32.0–36.0)
MCV: 85.9 fL (ref 80.0–100.0)
Platelets: 200 10*3/uL (ref 150–440)
RBC: 4.1 MIL/uL (ref 3.80–5.20)
RDW: 16.6 % — ABNORMAL HIGH (ref 11.5–14.5)
WBC: 7.9 10*3/uL (ref 3.6–11.0)

## 2017-12-15 MED ORDER — PRENATAL MULTIVITAMIN CH
1.0000 | ORAL_TABLET | Freq: Every day | ORAL | Status: DC
  Administered 2017-12-16: 1 via ORAL
  Filled 2017-12-15: qty 1

## 2017-12-15 MED ORDER — BETAMETHASONE SOD PHOS & ACET 6 (3-3) MG/ML IJ SUSP
INTRAMUSCULAR | Status: AC
  Administered 2017-12-15: 12 mg via INTRAMUSCULAR
  Filled 2017-12-15: qty 5

## 2017-12-15 MED ORDER — INSULIN ASPART 100 UNIT/ML ~~LOC~~ SOLN
SUBCUTANEOUS | Status: AC
  Administered 2017-12-15: 5 [IU] via SUBCUTANEOUS
  Filled 2017-12-15: qty 1

## 2017-12-15 MED ORDER — AMOXICILLIN 500 MG PO CAPS
500.0000 mg | ORAL_CAPSULE | Freq: Three times a day (TID) | ORAL | Status: DC
  Filled 2017-12-15: qty 1

## 2017-12-15 MED ORDER — INSULIN ASPART 100 UNIT/ML ~~LOC~~ SOLN
5.0000 [IU] | Freq: Three times a day (TID) | SUBCUTANEOUS | Status: DC
  Administered 2017-12-15: 5 [IU] via SUBCUTANEOUS
  Filled 2017-12-15 (×2): qty 0.05

## 2017-12-15 MED ORDER — CALCIUM CARBONATE ANTACID 500 MG PO CHEW: 2.0000 | CHEWABLE_TABLET | ORAL | Status: DC | PRN

## 2017-12-15 MED ORDER — BETAMETHASONE SOD PHOS & ACET 6 (3-3) MG/ML IJ SUSP
12.0000 mg | INTRAMUSCULAR | Status: AC
  Administered 2017-12-15 – 2017-12-16 (×2): 12 mg via INTRAMUSCULAR

## 2017-12-15 MED ORDER — ACETAMINOPHEN 325 MG PO TABS
650.0000 mg | ORAL_TABLET | ORAL | Status: DC | PRN
  Administered 2017-12-16 (×5): 650 mg via ORAL
  Filled 2017-12-15 (×5): qty 2

## 2017-12-15 MED ORDER — INSULIN DETEMIR 100 UNIT/ML ~~LOC~~ SOLN
15.0000 [IU] | Freq: Every day | SUBCUTANEOUS | Status: DC
  Administered 2017-12-15: 15 [IU] via SUBCUTANEOUS
  Filled 2017-12-15 (×2): qty 0.15

## 2017-12-15 MED ORDER — INSULIN ASPART 100 UNIT/ML ~~LOC~~ SOLN
0.0000 [IU] | Freq: Three times a day (TID) | SUBCUTANEOUS | Status: DC
  Administered 2017-12-16 (×2): 3 [IU] via SUBCUTANEOUS
  Filled 2017-12-15: qty 1
  Filled 2017-12-15: qty 0.2
  Filled 2017-12-15 (×2): qty 1

## 2017-12-15 MED ORDER — INSULIN ASPART 100 UNIT/ML ~~LOC~~ SOLN
5.0000 [IU] | Freq: Three times a day (TID) | SUBCUTANEOUS | Status: DC
  Administered 2017-12-16 (×3): 5 [IU] via SUBCUTANEOUS
  Filled 2017-12-15 (×11): qty 0.05
  Filled 2017-12-15 (×3): qty 1
  Filled 2017-12-15: qty 0.05

## 2017-12-15 MED ORDER — SODIUM CHLORIDE 0.9 % IV SOLN
250.0000 mg | Freq: Four times a day (QID) | INTRAVENOUS | Status: DC
  Administered 2017-12-15 – 2017-12-17 (×6): 250 mg via INTRAVENOUS
  Filled 2017-12-15 (×12): qty 5

## 2017-12-15 MED ORDER — ALBUTEROL SULFATE (2.5 MG/3ML) 0.083% IN NEBU: 2.5000 mg | INHALATION_SOLUTION | Freq: Four times a day (QID) | RESPIRATORY_TRACT | Status: DC | PRN

## 2017-12-15 MED ORDER — INSULIN ASPART 100 UNIT/ML ~~LOC~~ SOLN
SUBCUTANEOUS | Status: AC
  Administered 2017-12-15: 100 [IU]
  Filled 2017-12-15: qty 1

## 2017-12-15 MED ORDER — ERYTHROMYCIN BASE 250 MG PO TABS
250.0000 mg | ORAL_TABLET | Freq: Four times a day (QID) | ORAL | Status: DC
  Filled 2017-12-15 (×2): qty 1

## 2017-12-15 MED ORDER — SODIUM CHLORIDE 0.9 % IV SOLN
2.0000 g | Freq: Four times a day (QID) | INTRAVENOUS | Status: DC
  Administered 2017-12-15 – 2017-12-16 (×6): 2 g via INTRAVENOUS
  Filled 2017-12-15 (×7): qty 2000

## 2017-12-15 NOTE — OB Triage Note (Signed)
Thinks water broke @ 1445 today. Reports gush of fluid and "still leaking." Denies any color or odor. Angela Mccormick, Angela Mccormick

## 2017-12-15 NOTE — H&P (Addendum)
Obstetric H&P   Chief Complaint: Leaking fluid  Prenatal Care Provider: [facility]  History of Present Illness: 32 y.o. G1P0000 [redacted]w[redacted]d by 02/02/2018, by Last Menstrual Period = 8 week [place] presenting to L&D with preterm prelabor rupture of membranes.  Pregnancy complicated by maternal Chiari I malformation, gestational diabetes, and obesity.  The patient felt a gush of fluid around 14:45 on 12/15/2017 prompting presentation.  Noted to be grossly ruptures and confirmed with positive Nitrazine, pooling (clear fluid), and ferning.  +FM, no vaginal bleeding, no contractions. Cervix visually closed on presentation.    Pregravid weight 197 lb (89.4 kg) Total Weight Gain 15 lb (6.804 kg)  Pregnancy#1 Problems (from 04/28/17 to 12/15/17)    Problem Noted Resolved   Supervision of high risk pregnancy, antepartum 06/19/2017 by Vena Austria, MD No   Priority:  High     Overview Addendum 12/15/2017  5:14 PM by Vena Austria, MD    [facility] Prenatal Labs  Dating L=8 Blood type: A pos   Genetic Screen Declined Antibody: negative  Anatomic [place] Normal, complete Rubella: Immune Varicella: Immune  GTT HgbA1C 5.2   Third trimester: 143 3-hr: 94, 186, 165, 122 RPR: NR  Rhogam Not applicable HBsAg: Negative  TDaP vaccine      HIV: Negative  Baby Food                                GBS: Unknown collected 12/15/17  Contraception  Pap: NIL HPV negative 06/19/17  CBB     CS/VBAC Safe for vaginal delivery per neurologist at [facility]   Support Person           Gestational diabetes mellitus (GDM) 11/21/2017 by Natale Milch, MD No   Priority:  Medium     Overview Addendum 12/15/2017  5:16 PM by Vena Austria, MD    [ x] Diabetes Education and Testing Supplies [ x] Nutrition Cousult [ ]  US fetal growth every 4 weeks starting at 28 weeks     - 12/12/2017 32 week Growth 5lbs 5oz/ 2401g /w 72%ile [ ]  Twice weekly NST starting at [redacted] weeks gestation [ ]  Delivery planning contingent on fetal growth, AFI,  glycemic control, and other co-morbidities but at least by 39 weeks  Baseline and surveillance labs (pulled in from [facility], refresh links as needed)  Lab Results  Component Value Date   CREATININE 0.73 04/13/2017   AST 18 04/13/2017   ALT 20 04/13/2017   Lab Results  Component Value Date   HGBA1C 5.2 06/19/2017   HGBA1C 5.2 04/13/2017   HGBA1C 5.3 10/31/2016    Antenatal Testing Class of DM U/S NST/AFI DELIVERY  Diabetes   A1 - good control - O24.410    A2 - good control - O24.419      A2  - poor control or poor compliance - O24.419, E11.65   (Macrosomia or polyhydramnios) **E11.65 is extra code for poor control**    A2/B - O24.919  and B-C O24.319  Poor control B-C or D-R-F-T - O24.319  or  Type I DM - O24.019  20-38  20-38  20-24-28-32-36   20-24-28-32-35-38//fetal echo  20-24-27-30-33-36-38//fetal echo  40  32//2 x wk  32//2 x wk   32//2 x wk  28//BPP wkly then 32//2 x wk  40  39  PRN   39  PRN          Obesity affecting pregnancy, antepartum 06/19/2017 by Bonney Aid,  Carmel SacramentoAndreas, MD No   Nausea and vomiting during pregnancy 08/13/2017 by Conard NovakJackson, Stephen D, MD 12/15/2017 by [name], Latika Kronick, MD   BMI 37.0-37.9, adult 04/13/2017 by Glori LuisSonnenberg, Eric G, MD 12/15/2017 by [name], Emlyn Maves, MD        Review of Systems: 10 point review of systems negative unless otherwise noted in HPI  Past Medical History: Past Medical History:  Diagnosis Date  . Asthma   . Depression   . Gestational diabetes   . History of Chiari malformation   . History of syncope   . Migraine     Past Surgical History: Past Surgical History:  Procedure Laterality Date  . NO PAST SURGERIES    . WISDOM TOOTH EXTRACTION      Past Obstetric History: #: 1, Date: None, Sex: None, Weight: None, GA: None, Delivery: None, Apgar1: None, Apgar5: None, Living: None, Birth Comments: None   Past Gynecologic History:  Family History: Family History  Problem Relation Age of Onset    . Colon cancer Mother   . Heart disease Mother   . Diabetes Mother   . Diabetes Father     Social History: Social History   Socioeconomic History  . Marital status: Married    Spouse name: Not on file  . Number of children: Not on file  . Years of education: Not on file  . Highest education level: Not on file  Occupational History  . Not on file  Social Needs  . Financial resource strain: Not on file  . Food insecurity:    Worry: Not on file    Inability: Not on file  . Transportation needs:    Medical: Not on file    Non-medical: Not on file  Tobacco Use  . Smoking status: Never Smoker  . Smokeless tobacco: Never Used  Substance and Sexual Activity  . Alcohol use: No    Frequency: Never  . Drug use: No  . Sexual activity: Yes    Comment: undecided  Lifestyle  . Physical activity:    Days per week: Not on file    Minutes per session: Not on file  . Stress: Not on file  Relationships  . Social connections:    Talks on phone: Not on file    Gets together: Not on file    Attends religious service: Not on file    Active member of club or organization: Not on file    Attends meetings of clubs or organizations: Not on file    Relationship status: Not on file  . Intimate partner violence:    Fear of current or ex partner: Not on file    Emotionally abused: Not on file    Physically abused: Not on file    Forced sexual activity: Not on file  Other Topics Concern  . Not on file  Social History Narrative  . Not on file    Medications: Prior to Admission medications   Medication Sig Start Date End Date Taking? Authorizing Provider  ACCU-CHEK FASTCLIX LANCETS MISC 1 each by Percutaneous route 4 (four) times daily. 11/21/17  Yes Schuman, Christanna R, MD  glucose blood (ACCU-CHEK GUIDE) test strip Use as instructed 11/21/17  Yes Schuman, Christanna R, MD  Insulin Detemir (LEVEMIR) 100 UNIT/ML Pen Inject 15 Units into the skin at bedtime. 12/05/17  Yes Tresea MallGledhill, Jane,  CNM  insulin lispro (HUMALOG) 100 UNIT/ML KiwkPen Inject 0.05 mLs (5 Units total) into the skin 3 (three) times daily. 12/13/17  Yes Oswaldo ConroySchmid, Jacelyn Y, CNM  prenatal vitamin w/FE, FA (NATACHEW) 29-1 MG CHEW chewable tablet Chew 1 tablet by mouth daily at 12 noon.   Yes [provider]  albuterol (PROVENTIL HFA;VENTOLIN HFA) 108 (90 Base) MCG/ACT inhaler Inhale 2 puffs into the lungs every 6 (six) hours as needed for wheezing or shortness of breath. 10/31/16   Tommie Sams, DO  Doxylamine-Pyridoxine ER (BONJESTA) 20-20 MG TBCR Take 1 tablet by mouth 2 (two) times daily. Patient not taking: Reported on 12/15/2017 08/13/17   Conard Novak, MD  insulin aspart (NOVOLOG) 100 UNIT/ML FlexPen Inject 5 Units into the skin 3 (three) times daily with meals. Patient not taking: Reported on 12/15/2017 12/05/17   Tresea Mall, CNM  Misc. Devices (BREAST PUMP) MISC Dispense one breast pump for patient 11/07/17   Tresea Mall, CNM  terconazole (TERAZOL 7) 0.4 % vaginal cream Place 1 applicator vaginally at bedtime. Patient not taking: Reported on 12/05/2017 11/29/17   Natale Milch, MD    Allergies: Allergies  Allergen Reactions  . Sumatriptan Succinate Anxiety    Physical Exam: Vitals: Blood pressure 123/78, pulse 91, temperature 98.1 F (36.7 C), temperature source Oral, resp. rate 16, height 5\' 3"  (1.6 m), weight 212 lb (96.2 kg), last menstrual period 04/28/2017.  Urine Dip Protein: N/A  FHT: 140, moderate, +accles, no decels Toco: none  General: NAD HEENT: normocephalic, anicteric Pulmonary: No increased work of breathing Cardiovascular: RRR, distal pulses 2+ Abdomen: Gravid, non-tender Leopolds:vtx Genitourinary: Cervix visually closed positive clear pooling Extremities: no edema, erythema, or tenderness Neurologic: Grossly intact Psychiatric: mood appropriate, affect full  Labs: No results found for this or any previous visit (from the past 24 hour(s)).  Assessment: 32  y.o. G1P0000 [redacted]w[redacted]d by 02/02/2018, by Last Menstrual Period = 8 week [place] presenting with preterm prelabor rupture of membranes  Plan: 1) 32 y.o. is a G1P0000 at [redacted]w[redacted]d with preterm prelabor rupture of membranes (PPROM) requiring inpatient admission.  It was emphasized to the patient that she needs inpatient observation because she is at elevated risk for complications secondary to PPROM such as chorioamnionitis, placental abruption, umbilical cord prolapse, maternal or fetal distress; and these may constitute indications for delivery. 1. Latency antibiotics course for 7 days: Ampicillin 2 g IV every 6 hours for 48 hours, followed by Amoxicillin 500 mg orally every 8 hours for an additional five days & Erythromycin 250 mg IV every 6  Hours for 48 hours followed by  Erythromycin base 333 mg orally every 8 hours for and additional five days.  2. Course of betamethasone for reduction of fetal morbidity/mortality risk.   3. Course of magnesium sulfate for neuroprotection not indicated given gestational age <32 completed week 4. Timing of delivery should be anticipated by 34 weeks provided that testing remains reassuring and there is no evidence of chorioamnionitis or earlier onset of labor. 5. Delivery prior to 34 weeks for nonreassuring fetal testing, evidence of chorioamnionitis, or other evidence of maternal or fetal deterioration. 6. Interval growth should be continually reassessed every 3-4 weeks pregnancy additionally complicated by PPROM.  In addition presentation should be reconfirmed with the onset of labor.  7. Daily NSTs (more often if clinically warranted). Serial vital signs and serial clinical exams. CBC only as clinically indicated (noting that WBC at or above 20,000 constitutes leukocytosis for pregnancy).  Cervical exams only as clinically indicated to reduce risk of infection. 8. NICU consultation is recommended.  ACOG Practice Bulletin 172 October 2016 "Premature Rupture of Membranes"   2)  Fetus -  cat I tracing - 24-hr continuous monitoring if stable may transition to antepartum with daily NST's - GBS collected  3) PNL - Blood type A/Positive/-- (02/05 1107) / Anti-bodyscreen Negative (02/05 1107) / Rubella 1.38 (02/05 1107) / Varicella Immune/ RPR Non Reactive (06/26 1052) / HBsAg Negative (02/05 1107) / HIV Non Reactive (06/26 1052) / 1 4) Immunization History -  Immunization History  Administered Date(s) Administered  . Influenza,inj,Quad PF,6+ Mos 03/05/2017  . Influenza-Unspecified 03/05/2017  . Tdap 07/06/2014    5) GDM - continue home insulin dose and carb modified diet  6) Chiari I malformation - followed at [facility] neurology.  Anesthesia consult to discuss if appropriate candidate for regional anesthesia.  She is likely to have some elevation in WBC in setting of steroids and PPROM  8) Disposition - pending spontaneous labor or induction at 34 weeks  Vena Austria, MD, [facility], [facility] 12/15/2017, 5:04 PM

## 2017-12-15 NOTE — Consult Note (Signed)
  30 + Weeks  Neonatology Consult  Note:  At the request of the patients obstetrician Dr. Georgianne Fick. I met with Angela Mccormick who is at 38  wks currently with preg complicated by PROM, Asthma,Depression, Gestational Diabetes,History of Chiari malformation, History of Syncope and Migraines. Maternal medications include: Insulin, PNV with Fe, Ventolin, Bonjestra.  OB Plan: Antibiotics, Betamethasone, Delivery at 34 weeks as long as no evidence of chorioamnionitis, maternal or fetal deterioration.  We reviewed initial delivery room management, including CPAP, [place], and low but certainly possible need for intubation for surfactant administration.  We discussed feeding immaturity and need for full po intake with multiple days of good weight gain and no apnea or bradycardia before discharge.  We reviewed increased risk of jaundice, infection, and temperature instability.   Discussed likely length of stay.  Thank you for allowing [place] to participate in her care.  Please call with questions. Neil Crouch DNP, NNP-BC  Neonatal Nurse Practitioner   The total length of face-to-face or floor / unit time for this encounter was 30 minutes.  Counseling and / or coordination of care was greater than fifty percent of the time.

## 2017-12-15 NOTE — Progress Notes (Signed)
5 minute decel to 60's, good recovery and moderate variability following.  Category I tracing prior.  Will monitor closely for any additional fetal heart rate tracing.

## 2017-12-16 LAB — GLUCOSE, CAPILLARY
Glucose-Capillary: 115 mg/dL — ABNORMAL HIGH (ref 70–99)
Glucose-Capillary: 99 mg/dL (ref 70–99)
Glucose-Capillary: 135 mg/dL — ABNORMAL HIGH (ref 70–99)
Glucose-Capillary: 148 mg/dL — ABNORMAL HIGH (ref 70–99)

## 2017-12-16 MED ORDER — LACTATED RINGERS IV SOLN
500.0000 mL | INTRAVENOUS | Status: DC | PRN
  Administered 2017-12-17: 250 mL via INTRAVENOUS

## 2017-12-16 MED ORDER — INSULIN DETEMIR 100 UNIT/ML ~~LOC~~ SOLN
20.0000 [IU] | Freq: Every day | SUBCUTANEOUS | Status: DC
  Administered 2017-12-16: 20 [IU] via SUBCUTANEOUS
  Filled 2017-12-16 (×4): qty 0.2

## 2017-12-16 MED ORDER — LIDOCAINE HCL (PF) 1 % IJ SOLN
30.0000 mL | INTRAMUSCULAR | Status: DC | PRN
Start: 2017-12-16 — End: 2017-12-17

## 2017-12-16 MED ORDER — OXYTOCIN BOLUS FROM INFUSION: 500.0000 mL | Freq: Once | INTRAVENOUS | Status: DC

## 2017-12-16 MED ORDER — SOD CITRATE-CITRIC ACID 500-334 MG/5ML PO SOLN
30.0000 mL | ORAL | Status: DC | PRN
  Filled 2017-12-16: qty 15

## 2017-12-16 MED ORDER — OXYTOCIN 10 UNIT/ML IJ SOLN
INTRAMUSCULAR | Status: AC
  Filled 2017-12-16: qty 2

## 2017-12-16 MED ORDER — LIDOCAINE HCL (PF) 1 % IJ SOLN
INTRAMUSCULAR | Status: AC
  Filled 2017-12-16: qty 30

## 2017-12-16 MED ORDER — ONDANSETRON HCL 4 MG/2ML IJ SOLN: 4.0000 mg | Freq: Four times a day (QID) | INTRAMUSCULAR | Status: DC | PRN

## 2017-12-16 MED ORDER — LACTATED RINGERS IV SOLN: INTRAVENOUS | Status: DC

## 2017-12-16 MED ORDER — OXYTOCIN 40 UNITS IN LACTATED RINGERS INFUSION - SIMPLE MED
2.5000 [IU]/h | INTRAVENOUS | Status: DC
  Administered 2017-12-17: 650 [IU]/h via INTRAVENOUS
  Filled 2017-12-16: qty 1000

## 2017-12-16 MED ORDER — AMMONIA AROMATIC IN INHA
RESPIRATORY_TRACT | Status: AC
  Filled 2017-12-16: qty 10

## 2017-12-16 MED ORDER — MISOPROSTOL 200 MCG PO TABS
ORAL_TABLET | ORAL | Status: AC
  Filled 2017-12-16: qty 4

## 2017-12-16 MED ORDER — LACTATED RINGERS IV SOLN
INTRAVENOUS | Status: DC
  Administered 2017-12-16: 15:00:00 via INTRAVENOUS

## 2017-12-16 MED ORDER — ACETAMINOPHEN 325 MG PO TABS: 650.0000 mg | ORAL_TABLET | ORAL | Status: DC | PRN

## 2017-12-16 MED ORDER — BUTORPHANOL TARTRATE 2 MG/ML IJ SOLN
1.0000 mg | INTRAMUSCULAR | Status: DC | PRN
  Administered 2017-12-17: 1 mg via INTRAVENOUS
  Filled 2017-12-16 (×2): qty 1

## 2017-12-16 NOTE — Progress Notes (Signed)
   Subjective:  Called to evaluate patient increasing abdominal pain and pressure  Objective:   Vitals: Blood pressure (!) 110/59, pulse 76, temperature 98.5 F (36.9 C), temperature source Oral, resp. rate 18, height 5\' 3"  (1.6 m), weight 212 lb (96.2 kg), last menstrual period 04/28/2017, SpO2 96 %, unknown if currently breastfeeding. General: Appears in moderate discomfort Abdomen: Fundus soft, non-tender Cervical Exam: Sterile speculum exam revealed cervix to now be dilated.  In order to better assess the degree of cervical dilation Dilation: 5 Effacement (%): 100 Station: -1, 0 Presentation: Vertex Exam by:: Kylani Wires  FHT: 125, moderate, no accels, no decels Toco: irregular  Results for orders placed or performed during the hospital encounter of 12/15/17 (from the past 24 hour(s))  Glucose, capillary     Status: Abnormal   Collection Time: 12/16/17  5:17 AM  Result Value Ref Range   Glucose-Capillary 115 (H) 70 - 99 mg/dL  Glucose, capillary     Status: None   Collection Time: 12/16/17 11:05 AM  Result Value Ref Range   Glucose-Capillary 99 70 - 99 mg/dL  Glucose, capillary     Status: Abnormal   Collection Time: 12/16/17  2:39 PM  Result Value Ref Range   Glucose-Capillary 135 (H) 70 - 99 mg/dL  Glucose, capillary     Status: Abnormal   Collection Time: 12/16/17  7:52 PM  Result Value Ref Range   Glucose-Capillary 148 (H) 70 - 99 mg/dL    Assessment:   32 y.o. G1P0000 1344w1d   Plan:   1) Labor - move to labor bed, on appropriate coverage for GBS with latency antibiotics  2) Fetus - cat I tracing  3) Insulin dependent GDM - continue insuling and sliding scale -2hr CBG checks in labor   Vena AustriaAndreas Chance Karam, MD, Merlinda FrederickFACOG [facility], [facility] 12/16/2017, 11:42 PM

## 2017-12-16 NOTE — Progress Notes (Signed)
   Subjective:  No concerns.  She did start feeling some intermittent sharp pain and cramping currently managed with heating pad.  Continued leaking of clear fluid.  No vaginal bleeding.  +FM.  Objective:   Vitals: Blood pressure (!) 118/56, pulse 76, temperature 98.1 F (36.7 C), temperature source Oral, resp. rate 18, height 5\' 3"  (1.6 m), weight 212 lb (96.2 kg), last menstrual period 04/28/2017, SpO2 97 %, unknown if currently breastfeeding. Temp:  [97.8 F (36.6 C)-98.2 F (36.8 C)] 98.1 F (36.7 C) (08/04 0743) Pulse Rate:  [67-99] 76 (08/04 0743) Resp:  [16-20] 18 (08/04 0743) BP: (99-125)/(46-78) 118/56 (08/04 0743) SpO2:  [97 %] 97 % (08/04 0411) Weight:  [212 lb (96.2 kg)] 212 lb (96.2 kg) (08/03 1619)  General: NAD Abdomen: gravid, soft, non-tender Cervical Exam:  Dilation: Closed 12/15/17  FHT: 135, moderate, +accels, no decels Toco: irritability   Results for orders placed or performed during the hospital encounter of 12/15/17 (from the past 24 hour(s))  CBC     Status: Abnormal   Collection Time: 12/15/17  6:08 PM  Result Value Ref Range   WBC 7.9 3.6 - 11.0 K/uL   RBC 4.10 3.80 - 5.20 MIL/uL   Hemoglobin 12.4 12.0 - 16.0 g/dL   HCT 47.835.2 29.535.0 - 62.147.0 %   MCV 85.9 80.0 - 100.0 fL   MCH 30.3 26.0 - 34.0 pg   MCHC 35.2 32.0 - 36.0 g/dL   RDW 30.816.6 (H) 65.711.5 - 84.614.5 %   Platelets 200 150 - 440 K/uL  Type and screen     Status: None   Collection Time: 12/15/17  6:08 PM  Result Value Ref Range   ABO/RH(D) A POS    Antibody Screen NEG    Sample Expiration      12/18/2017 Performed at [facility], [address] [place], [place] [id number]   Glucose, capillary     Status: Abnormal   Collection Time: 12/15/17  9:01 PM  Result Value Ref Range   Glucose-Capillary 200 (H) 70 - 99 mg/dL  Glucose, capillary     Status: Abnormal   Collection Time: 12/15/17 10:02 PM  Result Value Ref Range   Glucose-Capillary 134 (H) 70 - 99 mg/dL  Glucose, capillary      Status: Abnormal   Collection Time: 12/16/17  5:17 AM  Result Value Ref Range   Glucose-Capillary 115 (H) 70 - 99 mg/dL    Assessment:   32 y.o. G1P0000 2411w1d with GDM and PPROM  Plan:   1) PPROM - s/p BMZ x 1 second dose this afternoon - maintain active type and screen - latency antibiotics  2) Fetus - cat I tracing - GBS pending  3) GDM  - continue meal coverage 5 units aspart - increase levemir to 20 units - continue SSI coverage  4) Diet - carb modified, LR carrier fluid  5) Activity - bedrest with bathroom privileges.  May get up to chair as well  6) DVT ppx - SCD's  7) Disposition - pending 34 week induction, indication for earlier delivery, or spontaneous labor   Vena AustriaAndreas Saamiya Jeppsen, MD, Merlinda FrederickFACOG [facility], [facility] 12/16/2017, 10:59 AM

## 2017-12-17 ENCOUNTER — Inpatient Hospital Stay: Payer: BLUE CROSS/BLUE SHIELD | Admitting: Anesthesiology

## 2017-12-17 ENCOUNTER — Encounter
Admission: EM | Disposition: A | Discharge status: Home / Self Care | Payer: Self-pay | Source: Home / Self Care | Attending: Obstetrics and Gynecology

## 2017-12-17 ENCOUNTER — Encounter: Payer: BLUE CROSS/BLUE SHIELD | Admitting: Obstetrics and Gynecology

## 2017-12-17 DIAGNOSIS — O350XX0 Maternal care for (suspected) central nervous system malformation in fetus, not applicable or unspecified: Secondary | ICD-10-CM

## 2017-12-17 DIAGNOSIS — Z3A33 33 weeks gestation of pregnancy: Secondary | ICD-10-CM

## 2017-12-17 DIAGNOSIS — G935 Compression of brain: Secondary | ICD-10-CM

## 2017-12-17 DIAGNOSIS — O24424 Gestational diabetes mellitus in childbirth, insulin controlled: Secondary | ICD-10-CM

## 2017-12-17 DIAGNOSIS — O42113 Preterm premature rupture of membranes, onset of labor more than 24 hours following rupture, third trimester: Secondary | ICD-10-CM

## 2017-12-17 LAB — CBC
HCT: 34.8 % — ABNORMAL LOW (ref 35.0–47.0)
Hemoglobin: 11.9 g/dL — ABNORMAL LOW (ref 12.0–16.0)
MCH: 29.9 pg (ref 26.0–34.0)
MCHC: 34.1 g/dL (ref 32.0–36.0)
MCV: 87.6 fL (ref 80.0–100.0)
Platelets: 176 10*3/uL (ref 150–440)
RBC: 3.97 MIL/uL (ref 3.80–5.20)
RDW: 17.2 % — ABNORMAL HIGH (ref 11.5–14.5)
WBC: 13.1 10*3/uL — ABNORMAL HIGH (ref 3.6–11.0)

## 2017-12-17 LAB — GLUCOSE, CAPILLARY
Glucose-Capillary: 137 mg/dL — ABNORMAL HIGH (ref 70–99)
Glucose-Capillary: 129 mg/dL — ABNORMAL HIGH (ref 70–99)
Glucose-Capillary: 127 mg/dL — ABNORMAL HIGH (ref 70–99)
Glucose-Capillary: 114 mg/dL — ABNORMAL HIGH (ref 70–99)
Glucose-Capillary: 114 mg/dL — ABNORMAL HIGH (ref 70–99)

## 2017-12-17 LAB — RPR: RPR Ser Ql: NONREACTIVE

## 2017-12-17 SURGERY — Surgical Case
Anesthesia: Spinal | Site: Abdomen | Wound class: Clean Contaminated

## 2017-12-17 MED ORDER — DIPHENHYDRAMINE HCL 25 MG PO CAPS: 25.0000 mg | ORAL_CAPSULE | ORAL | Status: DC | PRN

## 2017-12-17 MED ORDER — SIMETHICONE 80 MG PO CHEW
80.0000 mg | CHEWABLE_TABLET | Freq: Three times a day (TID) | ORAL | Status: DC
  Administered 2017-12-17 – 2017-12-20 (×9): 80 mg via ORAL
  Filled 2017-12-17 (×9): qty 1

## 2017-12-17 MED ORDER — BUPIVACAINE IN DEXTROSE 0.75-8.25 % IT SOLN
INTRATHECAL | Status: DC | PRN
  Administered 2017-12-17: 1.6 mL via INTRATHECAL

## 2017-12-17 MED ORDER — SOD CITRATE-CITRIC ACID 500-334 MG/5ML PO SOLN
30.0000 mL | ORAL | Status: AC
  Administered 2017-12-17: 30 mL via ORAL

## 2017-12-17 MED ORDER — NALBUPHINE HCL 10 MG/ML IJ SOLN: 5.0000 mg | INTRAMUSCULAR | Status: DC | PRN

## 2017-12-17 MED ORDER — DIPHENHYDRAMINE HCL 25 MG PO CAPS: 25.0000 mg | ORAL_CAPSULE | Freq: Four times a day (QID) | ORAL | Status: DC | PRN

## 2017-12-17 MED ORDER — SIMETHICONE 80 MG PO CHEW: 80.0000 mg | CHEWABLE_TABLET | ORAL | Status: DC | PRN

## 2017-12-17 MED ORDER — BUPIVACAINE HCL (PF) 0.5 % IJ SOLN
INTRAMUSCULAR | Status: AC
  Filled 2017-12-17: qty 30

## 2017-12-17 MED ORDER — OXYTOCIN 40 UNITS IN LACTATED RINGERS INFUSION - SIMPLE MED
INTRAVENOUS | Status: AC
  Filled 2017-12-17: qty 1000

## 2017-12-17 MED ORDER — PRENATAL MULTIVITAMIN CH
1.0000 | ORAL_TABLET | Freq: Every day | ORAL | Status: DC
  Administered 2017-12-17 – 2017-12-19 (×3): 1 via ORAL
  Filled 2017-12-17 (×3): qty 1

## 2017-12-17 MED ORDER — SIMETHICONE 80 MG PO CHEW
80.0000 mg | CHEWABLE_TABLET | ORAL | Status: DC
  Administered 2017-12-17 – 2017-12-19 (×2): 80 mg via ORAL
  Filled 2017-12-17 (×3): qty 1

## 2017-12-17 MED ORDER — TETANUS-DIPHTH-ACELL PERTUSSIS 5-2.5-18.5 LF-MCG/0.5 IM SUSP: 0.5000 mL | Freq: Once | INTRAMUSCULAR | Status: DC

## 2017-12-17 MED ORDER — LACTATED RINGERS IV SOLN: INTRAVENOUS | Status: DC

## 2017-12-17 MED ORDER — MEPERIDINE HCL 25 MG/ML IJ SOLN: 6.2500 mg | INTRAMUSCULAR | Status: DC | PRN

## 2017-12-17 MED ORDER — DIPHENHYDRAMINE HCL 50 MG/ML IJ SOLN: 12.5000 mg | INTRAMUSCULAR | Status: DC | PRN

## 2017-12-17 MED ORDER — INSULIN ASPART 100 UNIT/ML ~~LOC~~ SOLN
3.0000 [IU] | Freq: Once | SUBCUTANEOUS | Status: AC
  Administered 2017-12-17: 3 [IU] via SUBCUTANEOUS
  Filled 2017-12-17: qty 0.03
  Filled 2017-12-17: qty 1

## 2017-12-17 MED ORDER — PHENYLEPHRINE HCL 10 MG/ML IJ SOLN
INTRAMUSCULAR | Status: DC | PRN
  Administered 2017-12-17: 50 ug/min via INTRAVENOUS

## 2017-12-17 MED ORDER — EPHEDRINE SULFATE 50 MG/ML IJ SOLN
INTRAMUSCULAR | Status: AC
  Filled 2017-12-17: qty 1

## 2017-12-17 MED ORDER — ACETAMINOPHEN 500 MG PO TABS
1000.0000 mg | ORAL_TABLET | Freq: Four times a day (QID) | ORAL | Status: DC
  Administered 2017-12-17 (×3): 1000 mg via ORAL
  Filled 2017-12-17 (×3): qty 2

## 2017-12-17 MED ORDER — WITCH HAZEL-GLYCERIN EX PADS: 1.0000 "application " | MEDICATED_PAD | CUTANEOUS | Status: DC | PRN

## 2017-12-17 MED ORDER — BUPIVACAINE 0.25 % ON-Q PUMP DUAL CATH 400 ML
400.0000 mL | INJECTION | Status: DC
  Filled 2017-12-17: qty 400

## 2017-12-17 MED ORDER — SENNOSIDES-DOCUSATE SODIUM 8.6-50 MG PO TABS
2.0000 | ORAL_TABLET | ORAL | Status: DC
  Administered 2017-12-17: 2 via ORAL
  Filled 2017-12-17 (×3): qty 2

## 2017-12-17 MED ORDER — INSULIN ASPART 100 UNIT/ML ~~LOC~~ SOLN
3.0000 [IU] | Freq: Once | SUBCUTANEOUS | Status: AC
  Administered 2017-12-17: 3 [IU] via SUBCUTANEOUS

## 2017-12-17 MED ORDER — NALBUPHINE HCL 10 MG/ML IJ SOLN: 5.0000 mg | Freq: Once | INTRAMUSCULAR | Status: DC | PRN

## 2017-12-17 MED ORDER — ONDANSETRON HCL 4 MG/2ML IJ SOLN
INTRAMUSCULAR | Status: DC | PRN
  Administered 2017-12-17: 4 mg via INTRAVENOUS

## 2017-12-17 MED ORDER — SUCCINYLCHOLINE CHLORIDE 20 MG/ML IJ SOLN
INTRAMUSCULAR | Status: AC
  Filled 2017-12-17: qty 1

## 2017-12-17 MED ORDER — OXYTOCIN 40 UNITS IN LACTATED RINGERS INFUSION - SIMPLE MED
2.5000 [IU]/h | INTRAVENOUS | Status: DC
  Filled 2017-12-17: qty 1000

## 2017-12-17 MED ORDER — KETOROLAC TROMETHAMINE 30 MG/ML IJ SOLN: 30.0000 mg | Freq: Four times a day (QID) | INTRAMUSCULAR | Status: DC | PRN

## 2017-12-17 MED ORDER — CEFAZOLIN SODIUM-DEXTROSE 2-4 GM/100ML-% IV SOLN
2.0000 g | INTRAVENOUS | Status: AC
  Administered 2017-12-17: 2 g via INTRAVENOUS
  Filled 2017-12-17: qty 100

## 2017-12-17 MED ORDER — OXYCODONE-ACETAMINOPHEN 5-325 MG PO TABS
1.0000 | ORAL_TABLET | ORAL | Status: DC | PRN
  Administered 2017-12-18: 1 via ORAL
  Filled 2017-12-17: qty 1

## 2017-12-17 MED ORDER — OXYCODONE-ACETAMINOPHEN 5-325 MG PO TABS: 2.0000 | ORAL_TABLET | ORAL | Status: DC | PRN

## 2017-12-17 MED ORDER — KETOROLAC TROMETHAMINE 30 MG/ML IJ SOLN
30.0000 mg | Freq: Four times a day (QID) | INTRAMUSCULAR | Status: DC | PRN
  Administered 2017-12-18: 30 mg via INTRAVENOUS
  Filled 2017-12-17: qty 1

## 2017-12-17 MED ORDER — MORPHINE SULFATE (PF) 0.5 MG/ML IJ SOLN
INTRAMUSCULAR | Status: AC
  Filled 2017-12-17: qty 10

## 2017-12-17 MED ORDER — COCONUT OIL OIL
1.0000 "application " | TOPICAL_OIL | Status: DC | PRN
  Filled 2017-12-17: qty 120

## 2017-12-17 MED ORDER — MORPHINE SULFATE (PF) 0.5 MG/ML IJ SOLN
INTRAMUSCULAR | Status: DC | PRN
  Administered 2017-12-17: .1 mg via INTRATHECAL

## 2017-12-17 MED ORDER — NALOXONE HCL 0.4 MG/ML IJ SOLN: 0.4000 mg | INTRAMUSCULAR | Status: DC | PRN

## 2017-12-17 MED ORDER — TERBUTALINE SULFATE 1 MG/ML IJ SOLN
INTRAMUSCULAR | Status: AC
  Filled 2017-12-17: qty 1

## 2017-12-17 MED ORDER — BUPIVACAINE ON-Q PAIN PUMP (FOR ORDER SET NO CHG)
INJECTION | Status: AC
  Filled 2017-12-17: qty 1

## 2017-12-17 MED ORDER — SODIUM CHLORIDE 0.9% FLUSH: 3.0000 mL | INTRAVENOUS | Status: DC | PRN

## 2017-12-17 MED ORDER — NALOXONE HCL 4 MG/10ML IJ SOLN
1.0000 ug/kg/h | INTRAVENOUS | Status: DC | PRN
  Filled 2017-12-17: qty 5

## 2017-12-17 MED ORDER — BUPIVACAINE HCL (PF) 0.5 % IJ SOLN
INTRAMUSCULAR | Status: DC | PRN
  Administered 2017-12-17: 10 mL

## 2017-12-17 MED ORDER — ACETAMINOPHEN 325 MG PO TABS: 650.0000 mg | ORAL_TABLET | ORAL | Status: DC | PRN

## 2017-12-17 MED ORDER — DIBUCAINE 1 % RE OINT: 1.0000 "application " | TOPICAL_OINTMENT | RECTAL | Status: DC | PRN

## 2017-12-17 MED ORDER — BUPIVACAINE HCL (PF) 0.5 % IJ SOLN: 30.0000 mL | INTRAMUSCULAR | Status: AC

## 2017-12-17 MED ORDER — FENTANYL CITRATE (PF) 100 MCG/2ML IJ SOLN: 25.0000 ug | INTRAMUSCULAR | Status: DC | PRN

## 2017-12-17 MED ORDER — ONDANSETRON HCL 4 MG/2ML IJ SOLN
INTRAMUSCULAR | Status: AC
  Filled 2017-12-17: qty 2

## 2017-12-17 MED ORDER — ONDANSETRON HCL 4 MG/2ML IJ SOLN: 4.0000 mg | Freq: Once | INTRAMUSCULAR | Status: DC | PRN

## 2017-12-17 MED ORDER — EPHEDRINE SULFATE-NACL 50-0.9 MG/10ML-% IV SOSY
PREFILLED_SYRINGE | INTRAVENOUS | Status: DC | PRN
  Administered 2017-12-17: 50 mg via INTRAVENOUS

## 2017-12-17 MED ORDER — PHENYLEPHRINE HCL 10 MG/ML IJ SOLN
INTRAMUSCULAR | Status: AC
  Filled 2017-12-17: qty 1

## 2017-12-17 MED ORDER — IBUPROFEN 600 MG PO TABS: 600.0000 mg | ORAL_TABLET | Freq: Four times a day (QID) | ORAL | Status: DC

## 2017-12-17 MED ORDER — PHENYLEPHRINE HCL 10 MG/ML IJ SOLN
INTRAMUSCULAR | Status: DC | PRN
  Administered 2017-12-17: 200 ug via INTRAVENOUS

## 2017-12-17 MED ORDER — MENTHOL 3 MG MT LOZG
1.0000 | LOZENGE | OROMUCOSAL | Status: DC | PRN
  Filled 2017-12-17: qty 9

## 2017-12-17 SURGICAL SUPPLY — 34 items
BAG COUNTER SPONGE EZ (MISCELLANEOUS) ×2 IMPLANT
CANISTER SUCT 3000ML PPV (MISCELLANEOUS) ×2 IMPLANT
CATH KIT ON-Q SILVERSOAK 5IN (CATHETERS) ×4 IMPLANT
CHLORAPREP W/TINT 26ML (MISCELLANEOUS) ×4 IMPLANT
DERMABOND ADVANCED (GAUZE/BANDAGES/DRESSINGS) ×2
DERMABOND ADVANCED .7 DNX12 (GAUZE/BANDAGES/DRESSINGS) ×2 IMPLANT
DRESSING SURGICEL FIBRLLR 1X2 (HEMOSTASIS) ×1 IMPLANT
DRSG OPSITE POSTOP 4X10 (GAUZE/BANDAGES/DRESSINGS) ×2 IMPLANT
DRSG SURGICEL FIBRILLAR 1X2 (HEMOSTASIS) ×2
DRSG TELFA 3X8 NADH (GAUZE/BANDAGES/DRESSINGS) IMPLANT
ELECT CAUTERY BLADE 6.4 (BLADE) ×2 IMPLANT
ELECT REM PT RETURN 9FT ADLT (ELECTROSURGICAL) ×2
ELECTRODE REM PT RTRN 9FT ADLT (ELECTROSURGICAL) ×1 IMPLANT
GAUZE SPONGE 4X4 12PLY STRL (GAUZE/BANDAGES/DRESSINGS) ×2 IMPLANT
GLOVE BIO SURGEON STRL SZ7 (GLOVE) ×2 IMPLANT
GLOVE INDICATOR 7.5 STRL GRN (GLOVE) ×2 IMPLANT
GOWN STRL REUS W/ TWL LRG LVL3 (GOWN DISPOSABLE) ×3 IMPLANT
GOWN STRL REUS W/TWL LRG LVL3 (GOWN DISPOSABLE) ×3
NS IRRIG 1000ML POUR BTL (IV SOLUTION) ×2 IMPLANT
PACK C SECTION AR (MISCELLANEOUS) ×2 IMPLANT
PAD OB MATERNITY 4.3X12.25 (PERSONAL CARE ITEMS) ×2 IMPLANT
PAD PREP 24X41 OB/GYN DISP (PERSONAL CARE ITEMS) ×2 IMPLANT
RETRACTOR TRAXI PANNICULUS (MISCELLANEOUS) ×1 IMPLANT
RTRCTR C-SECT PINK 34CM XLRG (MISCELLANEOUS) ×2 IMPLANT
STAPLER INSORB 30 2030 C-SECTI (MISCELLANEOUS) ×2 IMPLANT
STRAP SAFETY 5IN WIDE (MISCELLANEOUS) ×2 IMPLANT
STRIP CLOSURE SKIN 1/2X4 (GAUZE/BANDAGES/DRESSINGS) IMPLANT
SUT MNCRL AB 4-0 PS2 18 (SUTURE) IMPLANT
SUT PDS AB 1 TP1 96 (SUTURE) ×2 IMPLANT
SUT PLAIN 2 0 XLH (SUTURE) ×2 IMPLANT
SUT VIC AB 0 CTX 36 (SUTURE) ×2
SUT VIC AB 0 CTX36XBRD ANBCTRL (SUTURE) ×2 IMPLANT
SUT VIC AB 2-0 CT1 36 (SUTURE) ×2 IMPLANT
TRAXI PANNICULUS RETRACTOR (MISCELLANEOUS) ×1

## 2017-12-17 NOTE — Anesthesia Procedure Notes (Signed)
Spinal  Start time: 12/17/2017 5:08 AM End time: 12/17/2017 5:11 AM Staffing Resident/CRNA: Clovis Fredricksonrisson, Tajh Livsey, CRNA Preanesthetic Checklist Completed: patient identified, site marked, surgical consent, pre-op evaluation, timeout performed, IV checked, risks and benefits discussed and monitors and equipment checked Spinal Block Patient position: sitting Prep: ChloraPrep Patient monitoring: heart rate, continuous pulse ox and blood pressure Approach: midline Location: L3-4 Injection technique: single-shot Needle Needle type: Whitacre  Needle gauge: 27 G Needle length: 9 cm Assessment Sensory level: T10 Additional Notes Patient sitting, L3-4 space identified, 1% lidocaine used for skin, negative heme, negative paresthesia, clear csf.  1.716ml of 0.75% bupivicaine injected with .1  Mg duramorph

## 2017-12-17 NOTE — Progress Notes (Signed)
Patient up to bedside with assistance to wheelchair. Foley removed. No c/o pain- On-Q pump infusing. Patient wheeled to SCN with husband to visit baby for the first time.

## 2017-12-17 NOTE — Progress Notes (Signed)
   Subjective:  Feeling more pressure  Objective:   Vitals: Blood pressure (!) 110/59, pulse 76, temperature 98.5 F (36.9 C), temperature source Oral, resp. rate 18, height 5\' 3"  (1.6 m), weight 212 lb (96.2 kg), last menstrual period 04/28/2017, SpO2 98 %, unknown if currently breastfeeding. General: NAD Abdomen: gravid, non-tender Cervical Exam:  Dilation: 8 Effacement (%): 100 Station: Plus 1, Plus 2 Presentation: Vertex Exam by:: Trameka Dorough  FHT: 120, moderate, +accels, no decels Toco: q875min  Results for orders placed or performed during the hospital encounter of 12/15/17 (from the past 24 hour(s))  Glucose, capillary     Status: Abnormal   Collection Time: 12/16/17  5:17 AM  Result Value Ref Range   Glucose-Capillary 115 (H) 70 - 99 mg/dL  Glucose, capillary     Status: None   Collection Time: 12/16/17 11:05 AM  Result Value Ref Range   Glucose-Capillary 99 70 - 99 mg/dL  Glucose, capillary     Status: Abnormal   Collection Time: 12/16/17  2:39 PM  Result Value Ref Range   Glucose-Capillary 135 (H) 70 - 99 mg/dL  Glucose, capillary     Status: Abnormal   Collection Time: 12/16/17  7:52 PM  Result Value Ref Range   Glucose-Capillary 148 (H) 70 - 99 mg/dL    Assessment:   32 y.o. G1P0000 8658w2d PPROM, GDM Type I  Plan:   1) Labor - continue expectant management  2) Fetus - cat I tracing  Vena AustriaAndreas Spenser Harren, MD, Merlinda FrederickFACOG [facility], [facility] 12/17/2017, 2:36 AM

## 2017-12-17 NOTE — Progress Notes (Signed)
Pt back from SCN, assisted back to bed.

## 2017-12-17 NOTE — Anesthesia Post-op Follow-up Note (Signed)
Anesthesia QCDR form completed.        

## 2017-12-17 NOTE — Progress Notes (Signed)
Subjective:    Objective:   Vitals: Blood pressure 122/68, pulse 71, temperature 98.5 F (36.9 C), temperature source Oral, resp. rate 18, height 5\' 3"  (1.6 m), weight 212 lb (96.2 kg), last menstrual period 04/28/2017, SpO2 98 %, unknown if currently breastfeeding. Body mass index is 37.55 kg/m.  General: Painfully contracting Abdomen: soft, non-tender in between contractions Cervical Exam:  Dilation: 10 Dilation Complete Date: 12/17/17 Dilation Complete Time: 0332 Effacement (%): 100 Station: Plus 2, Plus 3 Presentation: Vertex Exam by:: Semaj Coburn  FHT: 120, moderate, +accels, no decels Toco: q2-25min  Results for orders placed or performed during the hospital encounter of 12/15/17 (from the past 24 hour(s))  Glucose, capillary     Status: Abnormal   Collection Time: 12/16/17  5:17 AM  Result Value Ref Range   Glucose-Capillary 115 (H) 70 - 99 mg/dL  Glucose, capillary     Status: None   Collection Time: 12/16/17 11:05 AM  Result Value Ref Range   Glucose-Capillary 99 70 - 99 mg/dL  Glucose, capillary     Status: Abnormal   Collection Time: 12/16/17  2:39 PM  Result Value Ref Range   Glucose-Capillary 135 (H) 70 - 99 mg/dL  Glucose, capillary     Status: Abnormal   Collection Time: 12/16/17  7:52 PM  Result Value Ref Range   Glucose-Capillary 148 (H) 70 - 99 mg/dL  Glucose, capillary     Status: Abnormal   Collection Time: 12/17/17  2:41 AM  Result Value Ref Range   Glucose-Capillary 137 (H) 70 - 99 mg/dL  CBC     Status: Abnormal   Collection Time: 12/17/17  3:04 AM  Result Value Ref Range   WBC 13.1 (H) 3.6 - 11.0 K/uL   RBC 3.97 3.80 - 5.20 MIL/uL   Hemoglobin 11.9 (L) 12.0 - 16.0 g/dL   HCT 13.234.8 (L) 44.035.0 - 10.247.0 %   MCV 87.6 80.0 - 100.0 fL   MCH 29.9 26.0 - 34.0 pg   MCHC 34.1 32.0 - 36.0 g/dL   RDW 72.517.2 (H) 36.611.5 - 44.014.5 %   Platelets 176 150 - 440 K/uL    Assessment:   32 y.o. G1P0000 3782w2d PPROM, insulin dependent gestational diabetes, and fetal  intolerance to labor remote from delivery  Plan:   1) Labor - no descent in station after 1-hr of pushing with patient having a 5 minute late deceleration nadir in the 60's.  In addition, while cleared for valsalva her neurologist recommended expediting delivery if possible.  At this point we are remote from delivery, and given the above factors Cesarean section was discussed with the patient.  The patient was counseled regarding risk and benefits to proceeding with Cesarean section to expedite delivery.  Risk of cesarean section were discussed including risk of bleeding and need for potential intraoperative or postoperative blood transfusion with a rate of approximately 5% quoted for all Cesarean sections, risk of injury to adjacent organs including but not limited to bowl and bladder, the need for additional surgical procedures to address such injuries, and the risk of infection.  The risk of continued attempts at vaginal delivery include but are note limited to worsening fetal or maternal status.  After consideration of options the patient is amenable to proceed with primary cesarean section for delivery.   2) Fetus - cateoty I tracing when not pushing  3) GDM - continued SSI   Vena AustriaAndreas Dionna Wiedemann, MD, Merlinda FrederickFACOG [facility], [facility] 12/17/2017, 4:39 AM

## 2017-12-17 NOTE — Discharge Summary (Signed)
Obstetric Discharge Summary Reason for Admission: rupture of membranes Prenatal Procedures: PPROM management (BMZ, latency antibiotics) Intrapartum Procedures: cesarean: low cervical, transverse Postpartum Procedures: none Complications-Operative and Postpartum: none Hemoglobin  Date Value Ref Range Status  12/18/2017 9.6 (L) 12.0 - 16.0 g/dL Final  81/19/147806/26/2019 29.511.3 11.1 - 15.9 g/dL Final   HCT  Date Value Ref Range Status  12/18/2017 28.4 (L) 35.0 - 47.0 % Final   Hematocrit  Date Value Ref Range Status  11/07/2017 34.5 34.0 - 46.6 % Final    Physical Exam:  General: alert, cooperative, appears stated age and no distress Lochia: appropriate Uterine Fundus: firm Incision: healing well DVT Evaluation: No evidence of DVT seen on physical exam.  Discharge Diagnoses: Preterm premature rupture of membranes, Preterm delivery by cesarean section  Discharge Information: Date: 12/20/2017 Activity: pelvic rest and no driving for 2 weeks, no lifting more than 10 pounds for 2 weeks, no soaking in bathtub for 2 weeks Diet: diabetic healthy diet Allergies as of 12/20/2017      Reactions   Sumatriptan Succinate Anxiety      Medication List    STOP taking these medications   ACCU-CHEK FASTCLIX LANCETS Misc   Doxylamine-Pyridoxine ER 20-20 MG Tbcr   glucose blood test strip   insulin aspart 100 UNIT/ML FlexPen Commonly known as:  NOVOLOG   Insulin Detemir 100 UNIT/ML Pen Commonly known as:  LEVEMIR   insulin lispro 100 UNIT/ML KiwkPen Commonly known as:  HUMALOG   terconazole 0.4 % vaginal cream Commonly known as:  TERAZOL 7     TAKE these medications   albuterol 108 (90 Base) MCG/ACT inhaler Commonly known as:  PROVENTIL HFA;VENTOLIN HFA Inhale 2 puffs into the lungs every 6 (six) hours as needed for wheezing or shortness of breath.   Breast Pump Misc Dispense one breast pump for patient   norethindrone 0.35 MG tablet Commonly known as:  MICRONOR,CAMILA,ERRIN Take 1  tablet (0.35 mg total) by mouth daily. Start taking on:  01/06/2018   oxyCODONE-acetaminophen 5-325 MG tablet Commonly known as:  PERCOCET/ROXICET Take 1 tablet by mouth every 6 (six) hours as needed for up to 5 days (pain scale 4-7).   prenatal vitamin w/FE, FA 29-1 MG Chew chewable tablet Chew 1 tablet by mouth daily at 12 noon.   Ibuprofen 600 mg every 6 hours as needed          Discharge Care Instructions  (From admission, onward)         Start     Ordered   12/20/17 0000  Discharge wound care:    Comments:  Keep incision dry, clean.   12/20/17 0926          Condition: stable Instructions: refer to practice specific booklet Discharge to: home Follow-up Information    [name], Andreas, MD In 1 week.   Specialty:  Obstetrics and Gynecology Why:  For wound re-check Contact information: [address] [place] [address] [phone]  6 week follow up visit with 2 hour glucose tolerance test         Newborn Data: Live born female  Birth Weight: 5 lb 13.8 oz (2660 g) APGAR: 7, 9  Newborn Delivery   Birth date/time:  12/17/2017 05:42:00 Delivery type:  C-Section, Low Transverse Trial of labor:  Yes C-section categorization:  Primary    Newborn in NICU.  Tresea MallJane Heddy Vidana, CNM 12/20/2017, 9:29 AM

## 2017-12-17 NOTE — Consult Note (Signed)
Called to see pt regarding epidural placement. Neurologist notes state that OK for epidural but to avoid wet tap. The chance of wet tap is approximately 1-2%. Since there is no way to avoid this and the possibility of significant morbidity and mortality with a wet tap we will hold off on doing this procedure. If we can consult with the neurologist, to evaluate the complications we may reconsider. Pt was informed of these risks and understanding of the decision at this time.

## 2017-12-17 NOTE — Progress Notes (Signed)
  Subjective:  Post Op Day 0: 4 hours postpartum Patient is doing well. Her pain is well controlled. She is tolerating PO intake. She is planning to breastfeed and will begin pumping. Newborn nursery MD is in the room giving an update on baby's progress.    Objective:  Blood pressure (!) 108/59, pulse 81, temperature 98 F (36.7 C), temperature source Oral, resp. rate (!) 21, height 5\' 3"  (1.6 m), weight 212 lb (96.2 kg), last menstrual period 04/28/2017, SpO2 99 %  General: NAD Pulmonary: no increased work of breathing Abdomen: non-distended, non-tender, fundus firm at level of umbilicus Incision: dressing is C/D/I Extremities: no edema, no erythema, no tenderness  Results for orders placed or performed during the hospital encounter of 12/15/17 (from the past 24 hour(s))  Glucose, capillary     Status: None   Collection Time: 12/16/17 11:05 AM  Result Value Ref Range   Glucose-Capillary 99 70 - 99 mg/dL  Glucose, capillary     Status: Abnormal   Collection Time: 12/16/17  2:39 PM  Result Value Ref Range   Glucose-Capillary 135 (H) 70 - 99 mg/dL  Glucose, capillary     Status: Abnormal   Collection Time: 12/16/17  7:52 PM  Result Value Ref Range   Glucose-Capillary 148 (H) 70 - 99 mg/dL  Glucose, capillary     Status: Abnormal   Collection Time: 12/17/17  2:41 AM  Result Value Ref Range   Glucose-Capillary 137 (H) 70 - 99 mg/dL  CBC     Status: Abnormal   Collection Time: 12/17/17  3:04 AM  Result Value Ref Range   WBC 13.1 (H) 3.6 - 11.0 K/uL   RBC 3.97 3.80 - 5.20 MIL/uL   Hemoglobin 11.9 (L) 12.0 - 16.0 g/dL   HCT 81.134.8 (L) 91.435.0 - 78.247.0 %   MCV 87.6 80.0 - 100.0 fL   MCH 29.9 26.0 - 34.0 pg   MCHC 34.1 32.0 - 36.0 g/dL   RDW 95.617.2 (H) 21.311.5 - 08.614.5 %   Platelets 176 150 - 440 K/uL  Glucose, capillary     Status: Abnormal   Collection Time: 12/17/17  4:42 AM  Result Value Ref Range   Glucose-Capillary 129 (H) 70 - 99 mg/dL    Intake/Output Summary (Last 24 hours) at  12/17/2017 0949 Last data filed at 12/17/2017 [id number] Gross per 24 hour  Intake 500 ml  Output 1407 ml  Net -907 ml     Assessment:   32 y.o. 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

## 2017-12-17 NOTE — Op Note (Signed)
Preoperative Diagnosis: 1) 32 y.o. G1P0000 at [redacted]w[redacted]d 2) Preterm prelabor rupture of membranes 3) Gestational diabetes insulin controled 4) Fetal intolerance of labor  Postoperative Diagnosis: 1) 32 y.o. G1P0101 at [redacted]w[redacted]d 2) at [redacted]w[redacted]d 2) Preterm prelabor rupture of membranes 3) Gestational diabetes insulin controled 4) Fetal intolerance of labor  Operation Performed: Primary low transverse C-section via pfannenstiel skin incision  Indication:The patient began to spontaneously labor on day 2 after admission for preterm prelabor rupture of membranes.  Progressed to complete.  No descensus of the fetal vertex after 1 hour of pushing without epidural.  The patient then had a prolonged deceleration with good recovery.  Given remote from delivery decision made to proceed with primary cesarean for delivery.  Anesthesia: .Spinal  Primary Surgeon: Vena Austria, MD  Preoperative Antibiotics: 2g ancef  Estimated Blood Loss: 700 mL  IV Fluids:  Urine Output::  Drains or Tubes: Foley to gravity drainage, ON-Q catheter system  Implants: none  Specimens Removed: none  Complications: none  Intraoperative Findings:  Normal tubes ovaries and uterus.  Delivery resulted in the birth of a liveborn female, APGAR (1 MIN): 7   APGAR (5 MINS): 9, weight 5lbs 14oz  Patient Condition: stable  Procedure in Detail:  Patient was taken to the operating room were she was administered regional anesthesia.  She was positioned in the supine position, prepped and draped in the  Usual sterile fashion.  Prior to proceeding with the case a time out was performed and the level of anesthetic was checked and noted to be adequate.  Utilizing the scalpel a pfannenstiel skin incision was made 2cm above the pubic symphysis and carried down sharply to the the level of the rectus fascia.  The fascia was incised in the midline using the scalpel and then extended using mayo scissors.  The superior border of the  rectus fascia was grasped with two Kocher clamps and the underlying rectus muscles were dissected of the fascia using blunt dissection.  The median raphae was incised using Mayo scissors.   The inferior border of the rectus fascia was dissected of the rectus muscles in a similar fashion.  The midline was identified, the peritoneum was entered bluntly and expanded using manual tractions.  A self retaining Alexis retractor was placed.  The uterus was noted to be in a none rotated position.  Next the bladder blade was placed retracting the bladder caudally.  A bladder flap was not created.  A low transverse incision was scored on the lower uterine segment.  The hysterotomy was entered bluntly using the operators finger.  The hysterotomy incision was extended using manual traction.  The operators hand was placed within the hysterotomy position noting the fetus to be within the straight op position.  The vertex was grasped, flexed, brought to the incision, and delivered a traumatically using fundal pressure.  The remainder of the body delivered with ease.  The infant was suctioned, cord was clamped and cut before handing off to the awaiting neonatologist.  The placenta was delivered using manual extraction.  The uterus was exteriorized, wiped clean of clots and debris using two moist laps.  The hysterotomy was closed using a two layer closure of 0 Vicryl, with the first being a running locked, the second a vertical imbricating.  The uterus was returned to the abdomen.  The peritoneal gutters were wiped clean of clots and debris using two moist laps.  The hysterotomy incision was re-inspected noted to be hemostatic.  The rectus muscles  were inspected with a 2-0 vicryl stitch and Fibrillar placed on the right distal rectus muscle after which both muscle bellies were noted to be hemostatic.  The superior border of the rectus fascia was grasped with a Kocher clamp.  The ON-Q trocars were then placed 4cm above the superior  border of the incision and tunneled subfascially.  The introducers were removed and the catheters were threaded through the sleeves after which the sleeves were removed.  The fascia was closed using a looped #1 PDS in a running fashion taking 1cm by 1cm bites.  The subcutaneous tissue was irrigated using warm saline, hemostasis achieved using the bovie.  The subcutaneous dead space was greater than 3cm and was  closed.  The subcutaneous dead space was obliterated by using a 53-T 0 Chromic in a running fashion.  The skin was closed using Insorb staples.  Sponge needle and instrument counts were corrects times two.  The patient tolerated the procedure well and was taken to the recovery room in stable condition.

## 2017-12-17 NOTE — Lactation Note (Signed)
This note was copied from a baby's chart. Lactation Consultation Note  Patient Name: Boy Benay PikeBrittany Loveday ZOXWR'UToday's Date: 12/17/2017  Initiated pumping with [employer] DEBP.  Demonstrated hand expression.  Was unable to hand express any colostrum.  Mom has flat nipples that invert when compressed.  Instructed in pumping, collection, storage, labeling and handling of breast milk.  Reviewed supply and demand and need to pump around every 3 hours for stimulation.  Explained skin to skin and encouraged to try that as soon as was given permission from neonatologist.  Mom not wanting to go to nursery to see baby right now. Discussed normal course of lactation.  Mom has [employer] insurance and already has got DEBP.  Lactation name and number written on white board and encouraged to call with any questions, concerns or assistance.   Maternal Data    Feeding    LATCH Score                   Interventions    Lactation Tools Discussed/Used     Consult Status      Louis MeckelWilliams, Graysen Depaula Kay 12/17/2017, 5:51 PM

## 2017-12-17 NOTE — Transfer of Care (Signed)
Immediate Anesthesia Transfer of Care Note  Patient: Angela Mccormick  Procedure(s) Performed: CESAREAN SECTION (N/A Abdomen)  Patient Location: PACU OB  Anesthesia Type:Spinal  Level of Consciousness: awake, alert  and oriented  Airway & Oxygen Therapy: Patient Spontanous Breathing  Post-op Assessment: Report given to RN and Post -op Vital signs reviewed and stable  Post vital signs: Reviewed and stable  Last Vitals:  Vitals Value Taken Time  BP    Temp    Pulse    Resp    SpO2      Last Pain:  Vitals:   12/17/17 0236  TempSrc: Oral  PainSc:          Complications: No apparent anesthesia complications

## 2017-12-17 NOTE — Anesthesia Preprocedure Evaluation (Signed)
Anesthesia Evaluation  Patient identified by MRN, date of birth, ID band Patient awake    Reviewed: Allergy & Precautions, NPO status , Patient's Chart, lab work & pertinent test results  History of Anesthesia Complications Negative for: history of anesthetic complications  Airway Mallampati: III       Dental   Pulmonary asthma , neg sleep apnea, neg COPD,           Cardiovascular (-) hypertension(-) Past MI and (-) CHF (-) dysrhythmias (-) Valvular Problems/Murmurs     Neuro/Psych Depression Chiari malformation, asymptomatic, cleared for spinal    GI/Hepatic Neg liver ROS, neg GERD  ,  Endo/Other  diabetes, Gestational  Renal/GU negative Renal ROS     Musculoskeletal   Abdominal   Peds  Hematology   Anesthesia Other Findings   Reproductive/Obstetrics                             Anesthesia Physical Anesthesia Plan  ASA: II and emergent  Anesthesia Plan: Spinal   Post-op Pain Management:    Induction:   PONV Risk Score and Plan:   Airway Management Planned:   Additional Equipment:   Intra-op Plan:   Post-operative Plan:   Informed Consent: I have reviewed the patients History and Physical, chart, labs and discussed the procedure including the risks, benefits and alternatives for the proposed anesthesia with the patient or authorized representative who has indicated his/her understanding and acceptance.     Plan Discussed with:   Anesthesia Plan Comments:         Anesthesia Quick Evaluation

## 2017-12-18 LAB — CBC
HCT: 28.4 % — ABNORMAL LOW (ref 35.0–47.0)
Hemoglobin: 9.6 g/dL — ABNORMAL LOW (ref 12.0–16.0)
MCH: 29.8 pg (ref 26.0–34.0)
MCHC: 33.8 g/dL (ref 32.0–36.0)
MCV: 88 fL (ref 80.0–100.0)
Platelets: 180 10*3/uL (ref 150–440)
RBC: 3.23 MIL/uL — ABNORMAL LOW (ref 3.80–5.20)
RDW: 17 % — ABNORMAL HIGH (ref 11.5–14.5)
WBC: 8.9 10*3/uL (ref 3.6–11.0)

## 2017-12-18 LAB — CULTURE, BETA STREP (GROUP B ONLY)

## 2017-12-18 LAB — GLUCOSE, CAPILLARY: Glucose-Capillary: 72 mg/dL (ref 70–99)

## 2017-12-18 MED ORDER — IBUPROFEN 600 MG PO TABS
600.0000 mg | ORAL_TABLET | Freq: Four times a day (QID) | ORAL | Status: DC
  Administered 2017-12-18 – 2017-12-20 (×9): 600 mg via ORAL
  Filled 2017-12-18 (×9): qty 1

## 2017-12-18 NOTE — Lactation Note (Signed)
This note was copied from a baby's chart. Lactation Consultation Note  Patient Name: Angela Benay PikeBrittany Mccormick Today's Date: 12/18/2017     Maternal Data    Feeding    LATCH Score                   Interventions    Lactation Tools Discussed/Used     Consult Status  LC to room to visit mother and ask about pumping. Mother states that she has been pumping but is getting out very little to no colostrum. LC observed the pumping session and brought mother bigger flanges. LC encouraged mother to continue pumping with the bigger flanges ever 2-3 hours and to use coconut oil for sore nipples.    [name] 12/18/2017, 4:39 PM

## 2017-12-18 NOTE — Anesthesia Post-op Follow-up Note (Signed)
  Anesthesia Pain Follow-up Note  Patient: Angela KettleBrittany M Solomon  Day #: 1  Date of Follow-up: 12/18/2017 Time: 7:40 AM  Last Vitals:  Vitals:   12/17/17 1938 12/17/17 2359  BP: 119/62 109/71  Pulse: 85 65  Resp: 16 20  Temp: 36.6 C 36.6 C  SpO2: 98% 95%    Level of Consciousness: alert  Pain: none   Side Effects:Pruritis  Catheter Site Exam:clean, dry     Plan: D/C from anesthesia care at surgeon's request  [facility] Nyle Limb

## 2017-12-18 NOTE — Anesthesia Postprocedure Evaluation (Signed)
Anesthesia Post Note  Patient: Cannon KettleBrittany M Axton  Procedure(s) Performed: CESAREAN SECTION (N/A Abdomen)  Patient location during evaluation: Mother Baby Anesthesia Type: Spinal Level of consciousness: awake, awake and alert and oriented Pain management: pain level controlled Vital Signs Assessment: post-procedure vital signs reviewed and stable Respiratory status: spontaneous breathing, nonlabored ventilation and respiratory function stable Cardiovascular status: blood pressure returned to baseline and stable Postop Assessment: no headache, no backache and no apparent nausea or vomiting Anesthetic complications: no     Last Vitals:  Vitals:   12/17/17 1938 12/17/17 2359  BP: 119/62 109/71  Pulse: 85 65  Resp: 16 20  Temp: 36.6 C 36.6 C  SpO2: 98% 95%    Last Pain:  Vitals:   12/18/17 0535  TempSrc:   PainSc: 5                  [employer] CorporationStephanie Kanden Carey

## 2017-12-18 NOTE — Progress Notes (Signed)
POD#1 pLTCS Subjective:  She is ambulating in the room independently. Denies dizziness. Some pain getting in and out of bed. Pain control is adequate with On-Q and pain medication. Tolerating a regular diet. Voiding without difficulty.  Objective:  Blood pressure 112/76, pulse 64, temperature 98.4 F (36.9 C), temperature source Oral, resp. rate 20, height 5\' 3"  (1.6 m), weight 212 lb (96.2 kg), last menstrual period 04/28/2017, SpO2 95 %.  General: NAD Pulmonary: no increased work of breathing Abdomen: non-distended, non-tender, fundus firm at U/1, lochia appropriate Incision: Dressing C/D/I; On-Q in place and infusing Extremities: trace edema, no erythema, no tenderness  Results for orders placed or performed during the hospital encounter of 12/15/17 (from the past 72 hour(s))  Culture, beta strep (group b only)     Status: None (Preliminary result)   Collection Time: 12/15/17  4:55 PM  Result Value Ref Range   Specimen Description      VAGINAL/RECTAL Performed at [facility], [address] [place], [place] [address]    Special Requests      NONE Performed at [facility], [address] [place], [place] [address]    Culture      CULTURE REINCUBATED FOR BETTER GROWTH Performed at [facility], [address] [place], [place] [address]    Report Status PENDING   CBC     Status: Abnormal   Collection Time: 12/15/17  6:08 PM  Result Value Ref Range   WBC 7.9 3.6 - 11.0 K/uL   RBC 4.10 3.80 - 5.20 MIL/uL   Hemoglobin 12.4 12.0 - 16.0 g/dL   HCT 82.935.2 56.235.0 - 13.047.0 %   MCV 85.9 80.0 - 100.0 fL   MCH 30.3 26.0 - 34.0 pg   MCHC 35.2 32.0 - 36.0 g/dL   RDW 86.516.6 (H) 78.411.5 - 69.614.5 %   Platelets 200 150 - 440 K/uL    Comment: Performed at [facility], [address] [place], [place] [address]  RPR     Status: None   Collection Time: 12/15/17  6:08 PM  Result Value Ref Range   RPR Ser Ql Non Reactive Non Reactive    Comment:  (NOTE) Performed At: [facility] [address] [place], [place] [id number] Jolene SchimkeNagendra Sanjai MD UV:[id number]:[phone]   Type and screen     Status: None   Collection Time: 12/15/17  6:08 PM  Result Value Ref Range   ABO/RH(D) A POS    Antibody Screen NEG    Sample Expiration      12/18/2017 Performed at [facility], [address] [place], [place] [address]   Glucose, capillary     Status: Abnormal   Collection Time: 12/15/17  9:01 PM  Result Value Ref Range   Glucose-Capillary 200 (H) 70 - 99 mg/dL  Glucose, capillary     Status: Abnormal   Collection Time: 12/15/17 10:02 PM  Result Value Ref Range   Glucose-Capillary 134 (H) 70 - 99 mg/dL  Glucose, capillary     Status: Abnormal   Collection Time: 12/16/17  5:17 AM  Result Value Ref Range   Glucose-Capillary 115 (H) 70 - 99 mg/dL  Glucose, capillary     Status: None   Collection Time: 12/16/17 11:05 AM  Result Value Ref Range   Glucose-Capillary 99 70 - 99 mg/dL  Glucose, capillary     Status: Abnormal   Collection Time: 12/16/17  2:39 PM  Result Value Ref Range   Glucose-Capillary 135 (H) 70 -  99 mg/dL  Glucose, capillary     Status: Abnormal   Collection Time: 12/16/17  7:52 PM  Result Value Ref Range   Glucose-Capillary 148 (H) 70 - 99 mg/dL  Glucose, capillary     Status: Abnormal   Collection Time: 12/17/17  2:41 AM  Result Value Ref Range   Glucose-Capillary 137 (H) 70 - 99 mg/dL  CBC     Status: Abnormal   Collection Time: 12/17/17  3:04 AM  Result Value Ref Range   WBC 13.1 (H) 3.6 - 11.0 K/uL   RBC 3.97 3.80 - 5.20 MIL/uL   Hemoglobin 11.9 (L) 12.0 - 16.0 g/dL   HCT 16.1 (L) 09.6 - 04.5 %   MCV 87.6 80.0 - 100.0 fL   MCH 29.9 26.0 - 34.0 pg   MCHC 34.1 32.0 - 36.0 g/dL   RDW 40.9 (H) 81.1 - 91.4 %   Platelets 176 150 - 440 K/uL    Comment: Performed at [facility], [address] [place], [place] [address]  Glucose, capillary     Status: Abnormal   Collection Time: 12/17/17   4:42 AM  Result Value Ref Range   Glucose-Capillary 129 (H) 70 - 99 mg/dL  Glucose, capillary     Status: Abnormal   Collection Time: 12/17/17 12:39 PM  Result Value Ref Range   Glucose-Capillary 127 (H) 70 - 99 mg/dL  Glucose, capillary     Status: Abnormal   Collection Time: 12/17/17  5:28 PM  Result Value Ref Range   Glucose-Capillary 114 (H) 70 - 99 mg/dL  Glucose, capillary     Status: Abnormal   Collection Time: 12/17/17 10:04 PM  Result Value Ref Range   Glucose-Capillary 114 (H) 70 - 99 mg/dL  CBC     Status: Abnormal   Collection Time: 12/18/17  5:44 AM  Result Value Ref Range   WBC 8.9 3.6 - 11.0 K/uL   RBC 3.23 (L) 3.80 - 5.20 MIL/uL   Hemoglobin 9.6 (L) 12.0 - 16.0 g/dL   HCT 62.1 (L) 30.8 - 65.7 %   MCV 88.0 80.0 - 100.0 fL   MCH 29.8 26.0 - 34.0 pg   MCHC 33.8 32.0 - 36.0 g/dL   RDW 84.6 (H) 96.2 - 95.2 %   Platelets 180 150 - 440 K/uL    Comment: Performed at [facility], [address] [place], [place] [address]  Glucose, capillary     Status: None   Collection Time: 12/18/17  8:18 AM  Result Value Ref Range   Glucose-Capillary 72 70 - 99 mg/dL     Assessment:   32 y.o. G1P0101 post-operative day # 1 recovering well.  Plan:  1) Acute blood loss anemia - hemodynamically stable and asymptomatic.  2) Blood Type --/--/A POS (08/03 1808) / Rubella 1.38 (02/05 1107) / Varicella Immune  3) TDAP status: received vaccine in 2016, ordered postpartum.  4) Breast pumping for baby in special care nursery.  5) Disposition - discontinue insulin and BG checks. Will need 2 hour GTT at postpartum visit. Continue postpartum care.  Marcelyn Bruins, CNM 12/18/2017  8:38 AM

## 2017-12-19 NOTE — Progress Notes (Signed)
POD#2 pLTCS Subjective:  She is ambulating independently and visiting son in NICU. Good pain control  with On-Q.  Tolerating a regular diet. Passing flatus. Voiding without difficulty. Baby still on C PAP Objective:  BP 111/83 (BP Location: Right Arm)   Pulse 79   Temp 98.5 F (36.9 C) (Oral)   Resp 20   Ht 5\' 3"  (1.6 m)   Wt 96.2 kg (212 lb)   LMP 04/28/2017 (Exact Date)   SpO2 98%   Breastfeeding? Unknown   BMI 37.55 kg/m  General: WF in NAD Heart: RRR without murmur Pulmonary: no increased work of breathing, CTAB, decreased breath sounds in both bases Abdomen: non-distended, non-tender, fundus firm below U per RN exam, lochia appropriate Incision: Dressing C/D/I; On-Q in place and infusing Extremities: trace edema, no erythema, no tenderness  Results for orders placed or performed during the hospital encounter of 12/15/17 (from the past 72 hour(s))  Glucose, capillary     Status: Abnormal   Collection Time: 12/16/17  2:39 PM  Result Value Ref Range   Glucose-Capillary 135 (H) 70 - 99 mg/dL  Glucose, capillary     Status: Abnormal   Collection Time: 12/16/17  7:52 PM  Result Value Ref Range   Glucose-Capillary 148 (H) 70 - 99 mg/dL  Glucose, capillary     Status: Abnormal   Collection Time: 12/17/17  2:41 AM  Result Value Ref Range   Glucose-Capillary 137 (H) 70 - 99 mg/dL  CBC     Status: Abnormal   Collection Time: 12/17/17  3:04 AM  Result Value Ref Range   WBC 13.1 (H) 3.6 - 11.0 K/uL   RBC 3.97 3.80 - 5.20 MIL/uL   Hemoglobin 11.9 (L) 12.0 - 16.0 g/dL   HCT 57.834.8 (L) 46.935.0 - 62.947.0 %   MCV 87.6 80.0 - 100.0 fL   MCH 29.9 26.0 - 34.0 pg   MCHC 34.1 32.0 - 36.0 g/dL   RDW 52.817.2 (H) 41.311.5 - 24.414.5 %   Platelets 176 150 - 440 K/uL    Comment: Performed at [facility], [address] [place], [place] [address]  Glucose, capillary     Status: Abnormal   Collection Time: 12/17/17  4:42 AM  Result Value Ref Range   Glucose-Capillary 129 (H) 70 - 99 mg/dL   Glucose, capillary     Status: Abnormal   Collection Time: 12/17/17 12:39 PM  Result Value Ref Range   Glucose-Capillary 127 (H) 70 - 99 mg/dL  Glucose, capillary     Status: Abnormal   Collection Time: 12/17/17  5:28 PM  Result Value Ref Range   Glucose-Capillary 114 (H) 70 - 99 mg/dL  Glucose, capillary     Status: Abnormal   Collection Time: 12/17/17 10:04 PM  Result Value Ref Range   Glucose-Capillary 114 (H) 70 - 99 mg/dL  CBC     Status: Abnormal   Collection Time: 12/18/17  5:44 AM  Result Value Ref Range   WBC 8.9 3.6 - 11.0 K/uL   RBC 3.23 (L) 3.80 - 5.20 MIL/uL   Hemoglobin 9.6 (L) 12.0 - 16.0 g/dL   HCT 25.328.4 (L) 66.435.0 - 40.347.0 %   MCV 88.0 80.0 - 100.0 fL   MCH 29.8 26.0 - 34.0 pg   MCHC 33.8 32.0 - 36.0 g/dL   RDW 47.417.0 (H) 25.911.5 - 56.314.5 %   Platelets 180 150 - 440 K/uL    Comment: Performed at [facility], [address] [place], [place] [address]  Glucose, capillary     Status: None   Collection Time: 12/18/17  8:18 AM  Result Value Ref Range   Glucose-Capillary 72 70 - 99 mg/dL     Assessment:   32 y.o. G1P0101 post-operative day # 2 recovering well.  Plan:  1) Acute blood loss anemia - hemodynamically stable and asymptomatic. Po vitamins and iron 2) Blood Type --/--/A POS (08/03 1808) / Rubella 1.38 (02/05 1107) / Varicella Immune  3) TDAP status: received vaccine in 2016, ordered postpartum.  4) Breast pumping for baby in special care nursery.  5) Disposition -  insulin and BG checks discontinued yesterday. Will need 2 hour GTT at postpartum visit. Continue postpartum care. Probable discharge on POD #4  Farrel Conners, [place] 12/19/2017  11:45 AM

## 2017-12-19 NOTE — Progress Notes (Signed)
Provided period of purple cry video for mother to watch. Addressed all questions/concerns regarding the material provided. Copy of the dvd also provided for mother to take home.

## 2017-12-19 NOTE — Lactation Note (Signed)
This note was copied from a baby's chart. Lactation Consultation Note  Patient Name: Angela Mccormick Today's Date: 12/19/2017     Maternal Data    Feeding Feeding Type: Formula  LATCH Score                   Interventions    Lactation Tools Discussed/Used     Consult Status  Mother states that she is still pumping but is getting out very little colostrum. Mother denies any medical or endocrine issues and is not on any medication. LC advised that she pump at the baby's beside today when she is in SCN. Mother was given comfort gels and states that her nipples are feeling better.     Angela Mccormick 12/19/2017, 11:50 AM

## 2017-12-20 ENCOUNTER — Other Ambulatory Visit: Payer: BLUE CROSS/BLUE SHIELD

## 2017-12-20 ENCOUNTER — Encounter: Payer: BLUE CROSS/BLUE SHIELD | Admitting: Maternal Newborn

## 2017-12-20 MED ORDER — NORETHINDRONE 0.35 MG PO TABS: 1.0000 | ORAL_TABLET | Freq: Every day | ORAL | 11 refills | Status: DC

## 2017-12-20 MED ORDER — OXYCODONE-ACETAMINOPHEN 5-325 MG PO TABS: 1.0000 | ORAL_TABLET | Freq: Four times a day (QID) | ORAL | 0 refills | Status: AC | PRN

## 2017-12-20 NOTE — Progress Notes (Signed)
Patient discharged information covered. Patient discharged to home. Concerns addressed. Reviewed all patients discharge instructions and handouts. Prescriptions provided. questions have been answered at this time. Patient discharged via wheelchair with axillary.

## 2017-12-26 ENCOUNTER — Ambulatory Visit (INDEPENDENT_AMBULATORY_CARE_PROVIDER_SITE_OTHER): Payer: BLUE CROSS/BLUE SHIELD | Admitting: Obstetrics and Gynecology

## 2017-12-26 ENCOUNTER — Encounter: Payer: Self-pay | Admitting: Obstetrics and Gynecology

## 2017-12-26 VITALS — BP 112/66 | HR 95 | Wt 201.0 lb

## 2017-12-26 DIAGNOSIS — Z4889 Encounter for other specified surgical aftercare: Secondary | ICD-10-CM

## 2017-12-26 NOTE — Progress Notes (Signed)
Postoperative Follow-up Patient presents post op from 1LTCS  1weeks ago for failure to progress.  Subjective: Patient reports some improvement in her preop symptoms. Eating a regular diet without difficulty. Pain is controlled without any medications.  Activity: normal activities of daily living.  Objective: Blood pressure 112/66, pulse 95, weight 201 lb (91.2 kg), currently breastfeeding.  Gen: NAD HEENT: normocephalic, anicteric Abdomen: soft, non-tender, incision D/C/I, ON-Q catheters removed Ext: no edema  Admission on 12/15/2017, Discharged on 12/20/2017  Component Date Value Ref Range Status  . Specimen Description 12/15/2017    Final                   Value:VAGINAL/RECTAL Performed at [facility], [address] [place], [place] [address]   . Special Requests 12/15/2017    Final                   Value:NONE Performed at [facility], [address] [place], [place] [address]   . Culture 12/15/2017    Final                   Value:NO GROUP B STREP (S.AGALACTIAE) ISOLATED Performed at [facility], [address] [place], [place] [address]   . Report Status 12/15/2017 12/18/2017 FINAL   Final  . WBC 12/15/2017 7.9  3.6 - 11.0 K/uL Final  . RBC 12/15/2017 4.10  3.80 - 5.20 MIL/uL Final  . Hemoglobin 12/15/2017 12.4  12.0 - 16.0 g/dL Final  . HCT 84/13/244008/07/2017 35.2  35.0 - 47.0 % Final  . MCV 12/15/2017 85.9  80.0 - 100.0 fL Final  . MCH 12/15/2017 30.3  26.0 - 34.0 pg Final  . MCHC 12/15/2017 35.2  32.0 - 36.0 g/dL Final  . RDW 10/27/253608/07/2017 16.6* 11.5 - 14.5 % Final  . Platelets 12/15/2017 200  150 - 440 K/uL Final   Performed at [facility], [address] [place], [place] [address]  . RPR Ser Ql 12/15/2017 Non Reactive  Non Reactive Final   Comment: (NOTE) Performed At: [facility] [address] [place], [place] [id number] Jolene SchimkeNagendra Sanjai MD OV:[id number]:[phone]   . ABO/RH(D) 12/15/2017 A POS   Final  . Antibody  Screen 12/15/2017 NEG   Final  . Sample Expiration 12/15/2017    Final                   Value:12/18/2017 Performed at [facility], [address] [place], [place] [address]   . Varicella 06/19/2017 Nonimmune   Final  . Glucose-Capillary 12/15/2017 200* 70 - 99 mg/dL Final  . Glucose-Capillary 12/15/2017 134* 70 - 99 mg/dL Final  . Glucose-Capillary 12/16/2017 115* 70 - 99 mg/dL Final  . Glucose-Capillary 12/16/2017 99  70 - 99 mg/dL Final  . Glucose-Capillary 12/16/2017 135* 70 - 99 mg/dL Final  . Glucose-Capillary 12/16/2017 148* 70 - 99 mg/dL Final  . WBC 06/30/160108/09/2017 13.1* 3.6 - 11.0 K/uL Final  . RBC 12/17/2017 3.97  3.80 - 5.20 MIL/uL Final  . Hemoglobin 12/17/2017 11.9* 12.0 - 16.0 g/dL Final  . HCT 09/32/355708/09/2017 34.8* 35.0 - 47.0 % Final  . MCV 12/17/2017 87.6  80.0 - 100.0 fL Final  . MCH 12/17/2017 29.9  26.0 - 34.0 pg Final  . MCHC 12/17/2017 34.1  32.0 - 36.0 g/dL Final  . RDW 32/20/254208/09/2017 17.2* 11.5 - 14.5 % Final  . Platelets 12/17/2017 176  150 - 440 K/uL Final  Performed at [facility], [address] [place], [place] [address]  . Glucose-Capillary 12/17/2017 137* 70 - 99 mg/dL Final  . Glucose-Capillary 12/17/2017 129* 70 - 99 mg/dL Final  . Glucose-Capillary 12/17/2017 127* 70 - 99 mg/dL Final  . WBC 60/45/409808/10/2017 8.9  3.6 - 11.0 K/uL Final  . RBC 12/18/2017 3.23* 3.80 - 5.20 MIL/uL Final  . Hemoglobin 12/18/2017 9.6* 12.0 - 16.0 g/dL Final  . HCT 11/91/478208/10/2017 28.4* 35.0 - 47.0 % Final  . MCV 12/18/2017 88.0  80.0 - 100.0 fL Final  . MCH 12/18/2017 29.8  26.0 - 34.0 pg Final  . MCHC 12/18/2017 33.8  32.0 - 36.0 g/dL Final  . RDW 95/62/130808/10/2017 17.0* 11.5 - 14.5 % Final  . Platelets 12/18/2017 180  150 - 440 K/uL Final   Performed at [facility], [address] [place], [place] [address]  . Glucose-Capillary 12/17/2017 114* 70 - 99 mg/dL Final  . Glucose-Capillary 12/17/2017 114* 70 - 99 mg/dL Final  . Glucose-Capillary 12/18/2017 72  70 - 99  mg/dL Final    Assessment: 32 y.o. s/p 1LTCS stable  Plan: Patient has done well after surgery with no apparent complications.  I have discussed the post-operative course to date, and the expected progress moving forward.  The patient understands what complications to be concerned about.  I will see the patient in routine follow up, or sooner if needed.    Activity plan: No heavy lifting.  Infant remains in NICU but transitioned to nasal canula back to birth weight.  Discussed increased risk of PPD given infant in NICU and unplanned C-section.  Camila for contraception.   Vena AustriaAndreas Kathryne Ramella, MD, Evern CoreFACOG [facility], [facility] 12/26/2017, 9:25 PM

## 2017-12-28 ENCOUNTER — Ambulatory Visit: Payer: Self-pay

## 2017-12-28 NOTE — Lactation Note (Signed)
This note was copied from a baby's chart. Lactation Consultation Note  Patient Name: Angela Mccormick Today's Date: 12/28/2017   Mom has very sore nipples, not red , breast no sore or red,  Coconut oil and lanolin not helping, uses with pumping and uses low suction with pump but nipples hurt all of the time, no cracks that she knows of, given gel pads with instruction and encouraged her to contact OB to ask for APNO to be called in, let us know if pain continues  Maternal Data    Feeding Feeding Type: Breast Milk  LATCH Score                   Interventions    Lactation Tools Discussed/Used Tools: 68F feeding tube / Syringe;Pump   Consult Status      Angela Mccormick 12/28/2017, 8:06 PM

## 2018-01-07 ENCOUNTER — Ambulatory Visit: Payer: Self-pay

## 2018-01-07 NOTE — Lactation Note (Signed)
This note was copied from a baby's chart. Lactation Consultation Note  Patient Name: Boy Benay PikeBrittany Heberlein Today's Date: 01/07/2018  Assisted mom with comfortable position with pillow support and nursing stool for lick and learn session with Ebony Cargolayton in cradle hold skin to skin for 1400 pm feeding.  Natalia LeatherwoodKatherine, OT had worked with mom to bottlefeed at 1100am feeding.  At first he was rooting with wide open mouth, but was on and off the breast having difficulty maintaining latch.  Once we placed #20 nipple shield, he latched and began strong, vigorous, rhythmic sucking and swallowing at the breast.  He remained on the breast for 20 minutes with audible swallows before tube feeding was started.  Mom had complained about recurrent plugged ducts causing discomfort in breasts.  Hand out given and discussed Lecithin treatment regiment for recurrent plugged duct issues.    Demonstrated how to massage breast and stimulate Ebony Cargolayton for nutritive sucking.  He continued to breast feed for another 30 minutes after tube feeding started, but not as aggressively as the first 15 minutes.  Mom verbalizes softer breasts and no more hard, plugged areas after breast feeding.  Mom was so proud.  Praised mom for commitment to continue to supply breast milk for her baby.  Mom plans to come in tomorrow for 1100 am feeding.  Scheduled in calendar to do pre and post weight at that feeding.      Maternal Data    Feeding Feeding Type: Bottle Fed - Breast Milk Length of feed: 20 min  LATCH Score                   Interventions    Lactation Tools Discussed/Used     Consult Status      Louis MeckelWilliams, Genasis Zingale Kay 01/07/2018, 10:40 PM

## 2018-01-08 ENCOUNTER — Ambulatory Visit: Payer: Self-pay

## 2018-01-08 NOTE — Lactation Note (Signed)
This note was copied from a baby's chart. Lactation Consultation Note  Patient Name: Boy Benay PikeBrittany Runion Today's Date: 01/08/2018   I was called in to assist with the 1100 breastfeeding. Mom said she last pumped over 5 hours ago. Breasts are full, but not engorged. Mom was unable to correctly apply 20 mm nipple Lezley Bedgood. I could not apply it correctly on the right breast as nipple would not evert enough. With a few attempts, we were finally able to get nipple at least halfway up into the shield chamber of the left breast. Colton was actively rooting and seemed quite vigorous. I helped Mom with proper alignment and positioning in football hold on left side. He readily latched on to breast/nipple shield and had irregular suck swallow pattern for several seconds before pausing. Twice within about 5 minutes he had brady around 60's. His O2 Sat  once went to upper 80's, but he remained pink and without any respiratory distress.  That was when I stopped the feeding and RN planned to finish with OG feeding.   Meanwhile, I gave Mom breast shells to help her nipples evert better. I also encouraged her to pump more frequently with a goal of at least 8 sessions per 24 hours. I showed her the free My NICU Baby APP from [employer] to help her keep track. I gave Mom encouragement about how well Colton did as far as latch skill goes. Once his other medical issues resolves, he should do pretty well with breastfeeding.      Maternal Data    Feeding Feeding Type: Breast Fed Length of feed: 5 min(off/on)  LATCH Score Latch: Repeated attempts needed to sustain latch, nipple held in mouth throughout feeding, stimulation needed to elicit sucking reflex.  Audible Swallowing: A few with stimulation  Type of Nipple: Flat  Comfort (Breast/Nipple): Filling, red/small blisters or bruises, mild/mod discomfort  Hold (Positioning): Full assist, staff holds infant at breast  LATCH Score:  4  Interventions Interventions: Breast feeding basics reviewed;Assisted with latch;Hand express;Pre-pump if needed;Breast compression;Support pillows;Position options;Shells;Comfort gels;Adjust position  Lactation Tools Discussed/Used Tools: Nipple Dorris CarnesShields;Shells Nipple shield size: 20 Shell Type: Inverted Breast pump type: (I set up DEBP cribside; PNS at home)   Consult Status      Sunday CornSandra Clark Ace Bergfeld 01/08/2018, 1:46 PM

## 2018-01-11 ENCOUNTER — Other Ambulatory Visit: Payer: Self-pay

## 2018-01-11 MED ORDER — NORETHINDRONE 0.35 MG PO TABS: 1.0000 | ORAL_TABLET | Freq: Every day | ORAL | 11 refills | Status: DC

## 2018-01-15 ENCOUNTER — Ambulatory Visit: Payer: Self-pay

## 2018-01-15 NOTE — Lactation Note (Signed)
This note was copied from a baby's chart. Lactation Consultation Note  Patient Name: Boy Fatime Ancira KAJGO'T Date: 01/15/2018 Reason for consult: Follow-up assessment   Clayton's feed was due at 11, but delayed starting until 1155 due to sleepy baby and diaper change. After diaper change, he was very upset and crying. Mom comforted him at her chest. Mom still struggles to apply nipple shield to her slightly flat nipples. She has not been wearing breast shells which I believe would help. It took me a few attempts to apply it (he could not latch well without it), but it finally went on correctly. I showed Mom how to express some milk into the chamber (she last pumped over 4 hours ago. Full breasts, but not engorged). He soon latched and nursed well for approx 6 minutes before falling asleep. (No brady, etc) Mom burped him; he had hiccoughs, then latched on a few minutes later to continue nursing. He had one brady (low 90's) so mom took him off breast and calmly placed him on her chest. His post weight during that time was 20 ml. We put him back to breast and he immediately latched and nursed without much assist. He got tired within another 5-6 minutes, so we stopped. Total intake at breast was 26 ml. The delayed start to the feeding and hiccoughs are starting to get him behind in this feeding session, so Mom is pumping now and RN Selena Batten will see that he gets his full volume of feed as Mom (or staff) bottle feed the rest.   Mom was a little discouraged that he didn't get the 40 ml he got the other time he breastfed. I reminded her of how this is to be expected. Perhaps pre-pumping for a few minutes to evert the nipple more, start the flow of milk and soften the areola more will be helpful. She agreed to try next time.    Maternal Data    Feeding Feeding Type: Breast Fed(right breast; football hold) Length of feed: 20 min  LATCH Score Latch: Grasps breast easily, tongue down, lips flanged, rhythmical  sucking.(with nipple shield and milk inside of it)  Audible Swallowing: A few with stimulation(more with gentle breast compression)  Type of Nipple: Everted at rest and after stimulation  Comfort (Breast/Nipple): Soft / non-tender  Hold (Positioning): Assistance needed to correctly position infant at breast and maintain latch.  LATCH Score: 8  Interventions Interventions: Assisted with latch;Hand express;Adjust position;Support pillows;Position options;Expressed milk  Lactation Tools Discussed/Used Tools: Nipple Dashanti Burr Nipple shield size: 20   Consult Status Consult Status: Follow-up Follow-up type: Call as needed    Sunday Corn 01/15/2018, 12:26 PM

## 2018-01-28 ENCOUNTER — Encounter: Payer: Self-pay | Admitting: Obstetrics and Gynecology

## 2018-01-28 ENCOUNTER — Ambulatory Visit (INDEPENDENT_AMBULATORY_CARE_PROVIDER_SITE_OTHER): Payer: BLUE CROSS/BLUE SHIELD | Admitting: Obstetrics and Gynecology

## 2018-01-28 VITALS — BP 118/66 | HR 59 | Ht 63.0 in | Wt 199.0 lb

## 2018-01-28 DIAGNOSIS — B354 Tinea corporis: Secondary | ICD-10-CM

## 2018-01-28 DIAGNOSIS — Z4889 Encounter for other specified surgical aftercare: Secondary | ICD-10-CM

## 2018-01-28 DIAGNOSIS — Z23 Encounter for immunization: Secondary | ICD-10-CM

## 2018-01-28 DIAGNOSIS — O24414 Gestational diabetes mellitus in pregnancy, insulin controlled: Secondary | ICD-10-CM

## 2018-01-28 MED ORDER — NYSTATIN 100000 UNIT/GM EX CREA: 1.0000 "application " | TOPICAL_CREAM | Freq: Two times a day (BID) | CUTANEOUS | 1 refills | Status: DC

## 2018-01-28 NOTE — Addendum Note (Signed)
Addended by: [place]ORDAN, Jakevion Arney B on: 01/28/2018 02:42 PM   Modules accepted: Orders

## 2018-01-28 NOTE — Progress Notes (Signed)
Postpartum Visit  Chief Complaint:  Chief Complaint  Patient presents with  . Postpartum Care    Delivered C/S 8/5 preterm 33 weeks    History of Present Illness: Patient is a 32 y.o. G1P0101 presents for postpartum visit.  Date of delivery:  Cesarean Section: Second stage arrest Pregnancy or labor problems:  Yes PPROM, GDM Any problems since the delivery:  no  Newborn Details:  SINGLETON :  1. BabyGender female. Birth weight: 5lbs 14oz Maternal Details:  Breast or formula feeding: plans to breastfeed Intercourse: No  Contraception after delivery: Yes  Any bowel or bladder issues: No  Post partum depression/anxiety noted:  no Edinburgh Post-Partum Depression Score:5 Date of last PAP: 06/19/17  no abnormalities   Review of Systems: Review of Systems  Constitutional: Negative for chills and fever.  HENT: Negative for congestion.   Respiratory: Negative for cough and shortness of breath.   Cardiovascular: Negative for chest pain and palpitations.  Gastrointestinal: Negative for abdominal pain, constipation, diarrhea, heartburn, nausea and vomiting.  Genitourinary: Negative for dysuria, frequency and urgency.  Skin: Negative for itching and rash.  Neurological: Negative for dizziness and headaches.  Endo/Heme/Allergies: Negative for polydipsia.  Psychiatric/Behavioral: Negative for depression.    The following portions of the patient's history were reviewed and updated as appropriate: allergies, current medications, past family history, past medical history, past social history, past surgical history and problem list.  Past Medical History:  Past Medical History:  Diagnosis Date  . Asthma   . Depression   . Gestational diabetes   . History of Chiari malformation   . History of syncope   . Migraine     Past Surgical History:  Past Surgical History:  Procedure Laterality Date  . CESAREAN SECTION N/A 12/17/2017   Procedure: CESAREAN SECTION;  Surgeon: Vena Austria, MD;  Location: [facility] ORS;  Service: Obstetrics;  Laterality: N/A;  . NO PAST SURGERIES    . WISDOM TOOTH EXTRACTION      Family History:  Family History  Problem Relation Age of Onset  . Colon cancer Mother   . Heart disease Mother   . Diabetes Mother   . Diabetes Father     Social History:  Social History   Socioeconomic History  . Marital status: Married    Spouse name: Not on file  . Number of children: Not on file  . Years of education: Not on file  . Highest education level: Not on file  Occupational History  . Not on file  Social Needs  . Financial resource strain: Not on file  . Food insecurity:    Worry: Not on file    Inability: Not on file  . Transportation needs:    Medical: Not on file    Non-medical: Not on file  Tobacco Use  . Smoking status: Never Smoker  . Smokeless tobacco: Never Used  Substance and Sexual Activity  . Alcohol use: No    Frequency: Never  . Drug use: No  . Sexual activity: Yes    Comment: undecided  Lifestyle  . Physical activity:    Days per week: Not on file    Minutes per session: Not on file  . Stress: Not on file  Relationships  . Social connections:    Talks on phone: Not on file    Gets together: Not on file    Attends religious service: Not on file    Active member of club or organization: Not on file  Attends meetings of clubs or organizations: Not on file    Relationship status: Not on file  . Intimate partner violence:    Fear of current or ex partner: Not on file    Emotionally abused: Not on file    Physically abused: Not on file    Forced sexual activity: Not on file  Other Topics Concern  . Not on file  Social History Narrative  . Not on file    Allergies:  Allergies  Allergen Reactions  . Sumatriptan Succinate Anxiety    Medications: Prior to Admission medications   Medication Sig Start Date End Date Taking? Authorizing Provider  albuterol (PROVENTIL HFA;VENTOLIN HFA) 108 (90 Base)  MCG/ACT inhaler Inhale 2 puffs into the lungs every 6 (six) hours as needed for wheezing or shortness of breath. 10/31/16   Tommie Samsook, Jayce G, DO  Misc. Devices (BREAST PUMP) MISC Dispense one breast pump for patient 11/07/17   Tresea MallGledhill, Jane, CNM  norethindrone (MICRONOR,CAMILA,ERRIN) 0.35 MG tablet Take 1 tablet (0.35 mg total) by mouth daily. 01/11/18   Tresea MallGledhill, Jane, CNM  prenatal vitamin w/FE, FA (NATACHEW) 29-1 MG CHEW chewable tablet Chew 1 tablet by mouth daily at 12 noon.    [provider]    Physical Exam Blood pressure 118/66, pulse (!) 59, height 5\' 3"  (1.6 m), weight 199 lb (90.3 kg), last menstrual period 01/11/2018, currently breastfeeding.  General: NAD HEENT: normocephalic, anicteric Pulmonary: No increased work of breathing Abdomen: NABS, soft, non-tender, non-distended.  Umbilicus without lesions.  No hepatomegaly, splenomegaly or masses palpable. No evidence of hernia. Incision D/C/I well healed, evidence of tinea corporis Genitourinary:  External: Normal external female genitalia.  Normal urethral meatus, normal  Bartholin's and Skene's glands.    Vagina: Normal vaginal mucosa, no evidence of prolapse.    Cervix: Grossly normal in appearance, no bleeding  Uterus: Non-enlarged, mobile, normal contour.  No CMT  Adnexa: ovaries non-enlarged, no adnexal masses  Rectal: deferred Extremities: no edema, erythema, or tenderness Neurologic: Grossly intact Psychiatric: mood appropriate, affect full  Edinburgh Postnatal Depression Scale - 01/28/18 1359      Edinburgh Postnatal Depression Scale:  In the Past 7 Days   I have been able to laugh and see the funny side of things.  0    I have looked forward with enjoyment to things.  0    I have blamed myself unnecessarily when things went wrong.  0    I have been anxious or worried for no good reason.  0    I have felt scared or panicky for no good reason.  0    Things have been getting on top of me.  0    I have been so  unhappy that I have had difficulty sleeping.  1    I have felt sad or miserable.  2    I have been so unhappy that I have been crying.  2    The thought of harming myself has occurred to me.  0    Edinburgh Postnatal Depression Scale Total  5       Assessment: 32 y.o. G1P0101 presenting for 6 week postpartum visit  Plan: Problem List Items Addressed This Visit      Endocrine   Gestational diabetes mellitus (GDM) - Primary   Relevant Orders   Glucose Tolerance, 2 Hours w/1 Hour    Other Visit Diagnoses    6 weeks postpartum follow-up       Relevant Orders   Glucose  Tolerance, 2 Hours w/1 Hour   Postoperative visit       Tinea corporis       Relevant Medications   nystatin cream (MYCOSTATIN)      1) Contraception - Education given regarding options for contraception, as well as compatibility with breast feeding if applicable.  Patient plans on oral progesterone-only contraceptive for contraception.  2)  Pap - ASCCP guidelines and rational discussed.  ASCCP guidelines and rational discussed.  Patient opts for every 3 years screening interval  3) Patient underwent screening for postpartum depression with no signs of depression  4) GDM - 2-hr OGTT  5) Tinea corporis - nystatin rx   6)  Return in about 1 year (around 01/29/2019) for 1 year annual, sometime in the next 2 months 2-hr oral glucose tolerance test.   Vena Austria, MD, [facility], [facility] 01/28/2018, 2:24 PM

## 2018-02-02 ENCOUNTER — Inpatient Hospital Stay: Admit: 2018-02-02 | Payer: Self-pay

## 2018-02-04 ENCOUNTER — Telehealth: Payer: Self-pay | Admitting: Obstetrics and Gynecology

## 2018-02-04 ENCOUNTER — Encounter: Payer: Self-pay | Admitting: Obstetrics and Gynecology

## 2018-02-04 ENCOUNTER — Ambulatory Visit (INDEPENDENT_AMBULATORY_CARE_PROVIDER_SITE_OTHER): Payer: BLUE CROSS/BLUE SHIELD | Admitting: Obstetrics and Gynecology

## 2018-02-04 VITALS — BP 110/66 | HR 69 | Temp 99.7°F | Wt 197.0 lb

## 2018-02-04 DIAGNOSIS — Z4889 Encounter for other specified surgical aftercare: Secondary | ICD-10-CM

## 2018-02-04 NOTE — Telephone Encounter (Signed)
OK to get worked in for quick postop incision check anytime this week    ----- Message -----  From: SwazilandJordan, Kimberly, CMA  Sent: 02/04/2018  8:44 AM EDT  To: Vena AustriaAndreas Staebler, MD  Subject: FW: Visit Follow-Up Question                 ----- Message -----  From: Cannon KettleBrittany M Meegan  Sent: 02/04/2018  7:02 AM EDT  To: Ws[facility]Clinical  Subject: Visit Follow-Up Question               It is draining and it hurts   Called and left voicemail for patient to call back to be schedule

## 2018-02-04 NOTE — Telephone Encounter (Signed)
OK to get worked in for quick postop incision check anytime this week

## 2018-02-08 NOTE — Progress Notes (Signed)
Postoperative Follow-up Patient presents post op from C-section on 12/17/2017  Subjective: Patient reports some improvement in her preop symptoms. Eating a regular diet without difficulty. The patient is not having any pain.  Activity: normal activities of daily living.  Has noted incision open up on right aspect.  No fevers, no chills  Objective: Blood pressure 110/66, pulse 69, temperature 99.7 F (37.6 C), weight 197 lb (89.4 kg), last menstrual period 01/11/2018, currently breastfeeding.  Gen: NAD HEENT: normocephalic, anicteric Pulmonary: no increased work of breathing Abdomen: soft, non-tender, non-distended, no erythema, fluctuance, or induration noted at incision.  The right aspect of the incision has a small superficial defect consistent with insorb staple that has failed to dissolve and has been expelled.   Admission on 12/15/2017, Discharged on 12/20/2017  Component Date Value Ref Range Status  . Specimen Description 12/15/2017    Final                   Value:VAGINAL/RECTAL Performed at [facility], [address] [place] [place], [place] [address]   . Special Requests 12/15/2017    Final                   Value:NONE Performed at [facility], [address] [place] [place], [place] [address]   . Culture 12/15/2017    Final                   Value:NO GROUP B STREP (S.AGALACTIAE) ISOLATED Performed at [facility], [address] [place], [place] [address]   . Report Status 12/15/2017 12/18/2017 FINAL   Final  . WBC 12/15/2017 7.9  3.6 - 11.0 K/uL Final  . RBC 12/15/2017 4.10  3.80 - 5.20 MIL/uL Final  . Hemoglobin 12/15/2017 12.4  12.0 - 16.0 g/dL Final  . HCT 13/12/6576 35.2  35.0 - 47.0 % Final  . MCV 12/15/2017 85.9  80.0 - 100.0 fL Final  . MCH 12/15/2017 30.3  26.0 - 34.0 pg Final  . MCHC 12/15/2017 35.2  32.0 - 36.0 g/dL Final  . RDW 46/96/2952 16.6* 11.5 - 14.5 % Final  . Platelets 12/15/2017 200  150 - 440 K/uL Final   Performed at  [facility], [address] [place], [place] [address]  . RPR Ser Ql 12/15/2017 Non Reactive  Non Reactive Final   Comment: (NOTE) Performed At: [facility] [address] [place], [place] [id number] Jolene Schimke MD DG:[id number]   . ABO/RH(D) 12/15/2017 A POS   Final  . Antibody Screen 12/15/2017 NEG   Final  . Sample Expiration 12/15/2017    Final                   Value:12/18/2017 Performed at [facility], [address] [place], [place] [address]   . Varicella 06/19/2017 Nonimmune   Final  . Glucose-Capillary 12/15/2017 200* 70 - 99 mg/dL Final  . Glucose-Capillary 12/15/2017 134* 70 - 99 mg/dL Final  . Glucose-Capillary 12/16/2017 115* 70 - 99 mg/dL Final  . Glucose-Capillary 12/16/2017 99  70 - 99 mg/dL Final  . Glucose-Capillary 12/16/2017 135* 70 - 99 mg/dL Final  . Glucose-Capillary 12/16/2017 148* 70 - 99 mg/dL Final  . WBC 75/64/3329 13.1* 3.6 - 11.0 K/uL Final  . RBC 12/17/2017 3.97  3.80 - 5.20 MIL/uL Final  . Hemoglobin 12/17/2017 11.9* 12.0 - 16.0 g/dL Final  . HCT 51/88/4166 34.8* 35.0 - 47.0 % Final  . MCV  12/17/2017 87.6  80.0 - 100.0 fL Final  . MCH 12/17/2017 29.9  26.0 - 34.0 pg Final  . MCHC 12/17/2017 34.1  32.0 - 36.0 g/dL Final  . RDW 16/02/9603 17.2* 11.5 - 14.5 % Final  . Platelets 12/17/2017 176  150 - 440 K/uL Final   Performed at [facility], [address] [place], [place] [address]  . Glucose-Capillary 12/17/2017 137* 70 - 99 mg/dL Final  . Glucose-Capillary 12/17/2017 129* 70 - 99 mg/dL Final  . Glucose-Capillary 12/17/2017 127* 70 - 99 mg/dL Final  . WBC 11/91/4782 8.9  3.6 - 11.0 K/uL Final  . RBC 12/18/2017 3.23* 3.80 - 5.20 MIL/uL Final  . Hemoglobin 12/18/2017 9.6* 12.0 - 16.0 g/dL Final  . HCT 95/62/1308 28.4* 35.0 - 47.0 % Final  . MCV 12/18/2017 88.0  80.0 - 100.0 fL Final  . MCH 12/18/2017 29.8  26.0 - 34.0 pg Final  . MCHC 12/18/2017 33.8  32.0 - 36.0 g/dL Final  . RDW 65/78/4696 17.0*  11.5 - 14.5 % Final  . Platelets 12/18/2017 180  150 - 440 K/uL Final   Performed at [facility], [address] [place], [place] [address]  . Glucose-Capillary 12/17/2017 114* 70 - 99 mg/dL Final  . Glucose-Capillary 12/17/2017 114* 70 - 99 mg/dL Final  . Glucose-Capillary 12/18/2017 72  70 - 99 mg/dL Final    Assessment: 32 y.o. s/p 1LTCS with sterile stitch abscess Plan: Patient has done well after surgery with no apparent complications.  I have discussed the post-operative course to date, and the expected progress moving forward.  The patient understands what complications to be concerned about.  I will see the patient in routine follow up, or sooner if needed.    Activity plan: No restriction.  Base of area treated with silver nitrate, re-evaluate in 2 weeks.   Vena Austria, MD, [facility], [facility] 02/08/2018, 9:56 PM

## 2018-02-12 ENCOUNTER — Encounter: Payer: Self-pay | Admitting: Obstetrics & Gynecology

## 2018-02-12 ENCOUNTER — Ambulatory Visit (INDEPENDENT_AMBULATORY_CARE_PROVIDER_SITE_OTHER): Payer: BLUE CROSS/BLUE SHIELD | Admitting: Obstetrics & Gynecology

## 2018-02-12 ENCOUNTER — Telehealth: Payer: Self-pay | Admitting: Obstetrics and Gynecology

## 2018-02-12 VITALS — BP 118/70 | HR 79 | Ht 63.0 in | Wt 197.0 lb

## 2018-02-12 DIAGNOSIS — L03319 Cellulitis of trunk, unspecified: Secondary | ICD-10-CM

## 2018-02-12 MED ORDER — CEPHALEXIN 500 MG PO CAPS: 500.0000 mg | ORAL_CAPSULE | Freq: Four times a day (QID) | ORAL | 0 refills | Status: DC

## 2018-02-12 NOTE — Telephone Encounter (Signed)
Appointment Request From: Cannon Kettle    With Provider: Vena Austria, MD [facility]]    Preferred Date Range: 02/12/2018 - 02/12/2018    Preferred Times: Any time    Reason for visit: Request an Appointment    Comments:  The holes    Called and left voicemail for patient to call back to be schedule

## 2018-02-12 NOTE — Progress Notes (Signed)
Obstetrics & Gynecology Office Visit   Chief Complaint:  Chief Complaint  Patient presents with  . Follow-up    Incision Check, Both incisons are not closed, hurting and burn   History of Present Illness: 32 y.o. G1P0101 being seen for concern over skin around CS incision from 2 mos ago.  Had been doing well until last week or so of 2 areas opening up, some bleeding, and some pain.  Some redness to skin.  No fever.  Past Medical History:  Past Medical History:  Diagnosis Date  . Asthma   . Depression   . Gestational diabetes   . History of Chiari malformation   . History of syncope   . Migraine     Past Surgical History:  Past Surgical History:  Procedure Laterality Date  . CESAREAN SECTION N/A 12/17/2017   Procedure: CESAREAN SECTION;  Surgeon: Vena Austria, MD;  Location: [facility] ORS;  Service: Obstetrics;  Laterality: N/A;  . NO PAST SURGERIES    . WISDOM TOOTH EXTRACTION      Gynecologic History: No LMP recorded.  Obstetric History: G1P0101  Family History:  Family History  Problem Relation Age of Onset  . Colon cancer Mother   . Heart disease Mother   . Diabetes Mother   . Diabetes Father     Social History:  Social History   Socioeconomic History  . Marital status: Married    Spouse name: Not on file  . Number of children: Not on file  . Years of education: Not on file  . Highest education level: Not on file  Occupational History  . Not on file  Social Needs  . Financial resource strain: Not on file  . Food insecurity:    Worry: Not on file    Inability: Not on file  . Transportation needs:    Medical: Not on file    Non-medical: Not on file  Tobacco Use  . Smoking status: Never Smoker  . Smokeless tobacco: Never Used  Substance and Sexual Activity  . Alcohol use: No    Frequency: Never  . Drug use: No  . Sexual activity: Yes    Birth control/protection: Pill  Lifestyle  . Physical activity:    Days per week: Not on file   Minutes per session: Not on file  . Stress: Not on file  Relationships  . Social connections:    Talks on phone: Not on file    Gets together: Not on file    Attends religious service: Not on file    Active member of club or organization: Not on file    Attends meetings of clubs or organizations: Not on file    Relationship status: Not on file  . Intimate partner violence:    Fear of current or ex partner: Not on file    Emotionally abused: Not on file    Physically abused: Not on file    Forced sexual activity: Not on file  Other Topics Concern  . Not on file  Social History Narrative  . Not on file    Allergies:  Allergies  Allergen Reactions  . Sumatriptan Succinate Anxiety    Medications: Prior to Admission medications   Medication Sig Start Date End Date Taking? Authorizing Provider  Misc. Devices (BREAST PUMP) MISC Dispense one breast pump for patient 11/07/17  Yes Tresea Mall, CNM  norethindrone (MICRONOR,CAMILA,ERRIN) 0.35 MG tablet Take 1 tablet (0.35 mg total) by mouth daily. 01/11/18  Yes Tresea Mall,  CNM  prenatal vitamin w/FE, FA (NATACHEW) 29-1 MG CHEW chewable tablet Chew 1 tablet by mouth daily at 12 noon.   Yes [provider]  albuterol (PROVENTIL HFA;VENTOLIN HFA) 108 (90 Base) MCG/ACT inhaler Inhale 2 puffs into the lungs every 6 (six) hours as needed for wheezing or shortness of breath. Patient not taking: Reported on 02/12/2018 10/31/16   Tommie Sams, DO  cephALEXin (KEFLEX) 500 MG capsule Take 1 capsule (500 mg total) by mouth 4 (four) times daily. 02/12/18   Nadara Mustard, MD  metoCLOPramide (REGLAN) 5 MG tablet Take one tablet with Naproxen and Tramadol as needed for migraine. Not to exceed two days per week 12/06/16   [provider]  naproxen (NAPROSYN) 500 MG tablet  01/09/18   [provider]  traMADol (ULTRAM) 50 MG tablet Take 1 tablet with Reglan and naproxen as needed for Migraine. Not to exceed 2 days per week.  08/30/16   [provider]    Review of Systems  Constitutional: Negative for chills, fever and malaise/fatigue.  HENT: Negative for congestion, sinus pain and sore throat.   Eyes: Negative for blurred vision and pain.  Respiratory: Negative for cough and wheezing.   Cardiovascular: Negative for chest pain and leg swelling.  Gastrointestinal: Negative for abdominal pain, constipation, diarrhea, heartburn, nausea and vomiting.  Genitourinary: Negative for dysuria, frequency, hematuria and urgency.  Musculoskeletal: Negative for back pain, joint pain, myalgias and neck pain.  Skin: Negative for itching and rash.  Neurological: Negative for dizziness, tremors and weakness.  Endo/Heme/Allergies: Does not bruise/bleed easily.  Psychiatric/Behavioral: Negative for depression. The patient is not nervous/anxious and does not have insomnia.     Physical Exam Blood pressure 118/70, pulse 79, height 5\' 3"  (1.6 m), weight 197 lb (89.4 kg), currently breastfeeding.  No LMP recorded.  General: NAD HEENT: normocephalic, anicteric Abdomen- lower incision has 2 2cm skin separations with minimal deep penetration and not to level of fascia, skin erythema seen, no significant necrosis or discharge, bleeds to touch Extremities: 3+edema, no erythema, no tenderness Neurologic: Grossly intact Psychiatric: mood appropriate, affect full  Assessment: 32 y.o. G1P0101 presenting  Plan:  Cellulitis of trunk, unspecified site of trunk    -  Primary            Skin separation at incision site  Packed w moist [place] today, plan for WTD dressing changes daily, with first in follow up here tomorrow Keflex for skin cellulitis coverage   Annamarie Major, MD, [facility], [facility] 02/12/2018  4:03 PM

## 2018-02-13 ENCOUNTER — Ambulatory Visit (INDEPENDENT_AMBULATORY_CARE_PROVIDER_SITE_OTHER): Payer: BLUE CROSS/BLUE SHIELD | Admitting: Obstetrics and Gynecology

## 2018-02-13 ENCOUNTER — Encounter: Payer: Self-pay | Admitting: Obstetrics and Gynecology

## 2018-02-13 VITALS — BP 116/70 | HR 68 | Temp 98.0°F | Wt 198.0 lb

## 2018-02-13 DIAGNOSIS — T8149XA Infection following a procedure, other surgical site, initial encounter: Principal | ICD-10-CM

## 2018-02-13 DIAGNOSIS — T8141XA Infection following a procedure, superficial incisional surgical site, initial encounter: Secondary | ICD-10-CM

## 2018-02-13 DIAGNOSIS — Z9889 Other specified postprocedural states: Secondary | ICD-10-CM

## 2018-02-15 NOTE — Progress Notes (Signed)
Postoperative Follow-up Patient presents post op from LTCS 7weeks ago .  Subjective: Patient reports some improvement in her preop symptoms. Eating a regular diet without difficulty. The patient is not having any pain.  Activity: normal activities of daily living.  Objective: Blood pressure 116/70, pulse 68, temperature 98 F (36.7 C), weight 198 lb (89.8 kg), currently breastfeeding  General: NAD Pulmonary: no increased work of breathing Abdomen: soft, non-tender, non-distended, incision(s) D/C/I with two aras of wound disruption where staple have been expelled, healhty looking granulation tissue Extremities: no edema Neurologic: normal gait  .  Admission on 12/15/2017, Discharged on 12/20/2017  Component Date Value Ref Range Status  . Specimen Description 12/15/2017    Final                   Value:VAGINAL/RECTAL Performed at [facility], [address] [place] [place], [place] [address]   . Special Requests 12/15/2017    Final                   Value:NONE Performed at [facility], [address] [place] [place], [place] [address]   . Culture 12/15/2017    Final                   Value:NO GROUP B STREP (S.AGALACTIAE) ISOLATED Performed at [facility], [address] [place], [place] [address]   . Report Status 12/15/2017 12/18/2017 FINAL   Final  . WBC 12/15/2017 7.9  3.6 - 11.0 K/uL Final  . RBC 12/15/2017 4.10  3.80 - 5.20 MIL/uL Final  . Hemoglobin 12/15/2017 12.4  12.0 - 16.0 g/dL Final  . HCT 91/47/8295 35.2  35.0 - 47.0 % Final  . MCV 12/15/2017 85.9  80.0 - 100.0 fL Final  . MCH 12/15/2017 30.3  26.0 - 34.0 pg Final  . MCHC 12/15/2017 35.2  32.0 - 36.0 g/dL Final  . RDW 62/13/0865 16.6* 11.5 - 14.5 % Final  . Platelets 12/15/2017 200  150 - 440 K/uL Final   Performed at [facility], [address] [place], [place] [address]  . RPR Ser Ql 12/15/2017 Non Reactive  Non Reactive Final   Comment: (NOTE) Performed At: [facility] [address] [place], [place] [id number] Jolene Schimke MD KG:[id number]   . ABO/RH(D) 12/15/2017 A POS   Final  . Antibody Screen 12/15/2017 NEG   Final  . Sample Expiration 12/15/2017    Final                   Value:12/18/2017 Performed at [facility], [address], [place], [place] [address]   . Varicella 06/19/2017 Nonimmune   Final  . Glucose-Capillary 12/15/2017 200* 70 - 99 mg/dL Final  . Glucose-Capillary 12/15/2017 134* 70 - 99 mg/dL Final  . Glucose-Capillary 12/16/2017 115* 70 - 99 mg/dL Final  . Glucose-Capillary 12/16/2017 99  70 - 99 mg/dL Final  . Glucose-Capillary 12/16/2017 135* 70 - 99 mg/dL Final  . Glucose-Capillary 12/16/2017 148* 70 - 99 mg/dL Final  . WBC 47/42/5956 13.1* 3.6 - 11.0 K/uL Final  . RBC 12/17/2017 3.97  3.80 - 5.20 MIL/uL Final  . Hemoglobin 12/17/2017 11.9* 12.0 - 16.0 g/dL Final  . HCT 38/75/6433 34.8* 35.0 - 47.0 % Final  . MCV 12/17/2017 87.6  80.0 - 100.0 fL Final  . MCH 12/17/2017 29.9  26.0 - 34.0 pg Final  . MCHC 12/17/2017 34.1  32.0 - 36.0 g/dL  Final  . RDW 12/17/2017 17.2* 11.5 - 14.5 % Final  . Platelets 12/17/2017 176  150 - 440 K/uL Final   Performed at [facility], [address] [place] [place], [place] [address]  . Glucose-Capillary 12/17/2017 137* 70 - 99 mg/dL Final  . Glucose-Capillary 12/17/2017 129* 70 - 99 mg/dL Final  . Glucose-Capillary 12/17/2017 127* 70 - 99 mg/dL Final  . WBC 60/45/4098 8.9  3.6 - 11.0 K/uL Final  . RBC 12/18/2017 3.23* 3.80 - 5.20 MIL/uL Final  . Hemoglobin 12/18/2017 9.6* 12.0 - 16.0 g/dL Final  . HCT 11/91/4782 28.4* 35.0 - 47.0 % Final  . MCV 12/18/2017 88.0  80.0 - 100.0 fL Final  . MCH 12/18/2017 29.8  26.0 - 34.0 pg Final  . MCHC 12/18/2017 33.8  32.0 - 36.0 g/dL Final  . RDW 95/62/1308 17.0* 11.5 - 14.5 % Final  . Platelets 12/18/2017 180  150 - 440 K/uL Final   Performed at [facility], [address] [place], [place] [address]  .  Glucose-Capillary 12/17/2017 114* 70 - 99 mg/dL Final  . Glucose-Capillary 12/17/2017 114* 70 - 99 mg/dL Final  . Glucose-Capillary 12/18/2017 72  70 - 99 mg/dL Final    Assessment: 32 y.o. s/p cesarean section and stitch abscess  Plan: Patient has done well after surgery with no apparent complications.  I have discussed the post-operative course to date, and the expected progress moving forward.  The patient understands what complications to be concerned about.  I will see the patient in routine follow up, or sooner if needed.    May continue packing changes.  dscisused that area will need to granualte in and may take several weeks.  May continue antibiotics but do not suspect underlying infection   Vena Austria, MD, [facility], [facility] 02/15/2018, 10:54 PM

## 2018-02-18 ENCOUNTER — Encounter: Payer: Self-pay | Admitting: Obstetrics and Gynecology

## 2018-02-18 ENCOUNTER — Ambulatory Visit (INDEPENDENT_AMBULATORY_CARE_PROVIDER_SITE_OTHER): Payer: BLUE CROSS/BLUE SHIELD | Admitting: Obstetrics and Gynecology

## 2018-02-18 VITALS — BP 126/78 | HR 78 | Wt 198.0 lb

## 2018-02-18 DIAGNOSIS — Z9889 Other specified postprocedural states: Secondary | ICD-10-CM

## 2018-02-18 DIAGNOSIS — T8141XA Infection following a procedure, superficial incisional surgical site, initial encounter: Secondary | ICD-10-CM

## 2018-02-18 DIAGNOSIS — T8149XA Infection following a procedure, other surgical site, initial encounter: Principal | ICD-10-CM

## 2018-02-19 NOTE — Progress Notes (Signed)
Postoperative Follow-up Patient presents post op from 1LTCS 8weeks ago for fetal intolerance.  Subjective: Patient reports some improvement in her preop symptoms. Eating a regular diet without difficulty. Pain is controlled without any medications.  Activity: normal activities of daily living.  Objective: Blood pressure 126/78, pulse 78, weight 198 lb (89.8 kg), currently breastfeeding.  General: NAD Pulmonary: no increased work of breathing Abdomen: soft, non-tender, non-distended, incision with right later aspect with 2.5cm long 0.5cm deep defect mid portion 1.5cm shallow defect.  No odor, no erythema, no discharge.  Bases treated with silver nitrated 1/2" packing strip applied to right later defects Extremities: no edema Neurologic: normal gait    Admission on 12/15/2017, Discharged on 12/20/2017  Component Date Value Ref Range Status  . Specimen Description 12/15/2017    Final                   Value:VAGINAL/RECTAL Performed at [facility], [address] [place] [place], [place] [address]   . Special Requests 12/15/2017    Final                   Value:NONE Performed at [facility], [address] [place], [place] [address]   . Culture 12/15/2017    Final                   Value:NO GROUP B STREP (S.AGALACTIAE) ISOLATED Performed at [facility], [address] [place], [place] [address]   . Report Status 12/15/2017 12/18/2017 FINAL   Final  . WBC 12/15/2017 7.9  3.6 - 11.0 K/uL Final  . RBC 12/15/2017 4.10  3.80 - 5.20 MIL/uL Final  . Hemoglobin 12/15/2017 12.4  12.0 - 16.0 g/dL Final  . HCT 91/47/8295 35.2  35.0 - 47.0 % Final  . MCV 12/15/2017 85.9  80.0 - 100.0 fL Final  . MCH 12/15/2017 30.3  26.0 - 34.0 pg Final  . MCHC 12/15/2017 35.2  32.0 - 36.0 g/dL Final  . RDW 62/13/0865 16.6* 11.5 - 14.5 % Final  . Platelets 12/15/2017 200  150 - 440 K/uL Final   Performed at [facility], [address] [place], [place] [address]    . RPR Ser Ql 12/15/2017 Non Reactive  Non Reactive Final   Comment: (NOTE) Performed At: [facility] [address] [place], [place] [id number] Jolene Schimke MD KG:[id number]   . ABO/RH(D) 12/15/2017 A POS   Final  . Antibody Screen 12/15/2017 NEG   Final  . Sample Expiration 12/15/2017    Final                   Value:12/18/2017 Performed at [facility], [address] [place], [place] [address]   . Varicella 06/19/2017 Nonimmune   Final  . Glucose-Capillary 12/15/2017 200* 70 - 99 mg/dL Final  . Glucose-Capillary 12/15/2017 134* 70 - 99 mg/dL Final  . Glucose-Capillary 12/16/2017 115* 70 - 99 mg/dL Final  . Glucose-Capillary 12/16/2017 99  70 - 99 mg/dL Final  . Glucose-Capillary 12/16/2017 135* 70 - 99 mg/dL Final  . Glucose-Capillary 12/16/2017 148* 70 - 99 mg/dL Final  . WBC 47/42/5956 13.1* 3.6 - 11.0 K/uL Final  . RBC 12/17/2017 3.97  3.80 - 5.20 MIL/uL Final  . Hemoglobin 12/17/2017 11.9* 12.0 - 16.0 g/dL Final  . HCT 38/75/6433 34.8* 35.0 - 47.0 % Final  . MCV 12/17/2017 87.6  80.0 - 100.0 fL Final  . [facility] 12/17/2017  29.9  26.0 - 34.0 pg Final  . MCHC 12/17/2017 34.1  32.0 - 36.0 g/dL Final  . RDW 40/98/1191 17.2* 11.5 - 14.5 % Final  . Platelets 12/17/2017 176  150 - 440 K/uL Final   Performed at [facility], [address], [place], [place] [address]  . Glucose-Capillary 12/17/2017 137* 70 - 99 mg/dL Final  . Glucose-Capillary 12/17/2017 129* 70 - 99 mg/dL Final  . Glucose-Capillary 12/17/2017 127* 70 - 99 mg/dL Final  . WBC 56/21/3086 8.9  3.6 - 11.0 K/uL Final  . RBC 12/18/2017 3.23* 3.80 - 5.20 MIL/uL Final  . Hemoglobin 12/18/2017 9.6* 12.0 - 16.0 g/dL Final  . HCT 57/84/6962 28.4* 35.0 - 47.0 % Final  . MCV 12/18/2017 88.0  80.0 - 100.0 fL Final  . MCH 12/18/2017 29.8  26.0 - 34.0 pg Final  . MCHC 12/18/2017 33.8  32.0 - 36.0 g/dL Final  . RDW 95/28/4132 17.0* 11.5 - 14.5 % Final  . Platelets 12/18/2017 180  150 - 440 K/uL  Final   Performed at [facility], [address], [place], [place] [address]  . Glucose-Capillary 12/17/2017 114* 70 - 99 mg/dL Final  . Glucose-Capillary 12/17/2017 114* 70 - 99 mg/dL Final  . Glucose-Capillary 12/18/2017 72  70 - 99 mg/dL Final    Assessment: 32 y.o. s/p 1LTCS with postoperative stitch abscess   Plan: Patient has done well after surgery with no apparent complications.  I have discussed the post-operative course to date, and the expected progress moving forward.  The patient understands what complications to be concerned about.  I will see the patient in routine follow up, or sooner if needed.    Silver nitrate applied today to aid in granulation  Stop packing central defect continue wick placement right later aspect  Return in about 1 week (around 02/25/2018) for postop Norleen Xie.   Vena Austria, MD, [facility], [facility]

## 2018-02-21 ENCOUNTER — Ambulatory Visit: Payer: Self-pay

## 2018-02-21 NOTE — Lactation Note (Addendum)
This note was copied from a baby's chart. Lactation Consultation Note  Patient Name: Angela Mccormick Today's Date: 02/21/2018     Maternal Data  Mother states that she had to see a Speech therapist as a child for speech issues.  Feeding    LATCH Score                   Interventions    Lactation Tools Discussed/Used     Consult Status  Angela Mccormick and mother in today for a lactation outpatient consult. Angela Mccormick is now 93 months old. Mother states that Angela Mccormick is having difficulties with latching and has lost an ounce last week but has gained 4 ounces this week. Mother states that she is supplementing with 4 ounces of formula after the breastfeeding session. Angela Mccormick was weighed before breastfeeding and pre-weight was 4318 grams. LC assisted with latch without the shield but Angela Mccormick was having trouble latching so the shield was used. Angela Mccormick breast-fed for about 20 minutes and kept sliding off the nipple shield. He was also clicking throughout the feed and his top lip seemed to be tight (LC had to keep flanging the top lip multiple times throughout the feeding). Post weight was obtained and was 4360 grams (9 lbs 9.8 ounces). Infant took 42 mL during the feeding. Mother fed 4 ounces of formula to [facility] via the bottle and pumped 75 mL after the feeding. LC explained to mother that Angela Mccormick is having a hard time maintaining the latch while breastfeeding and his top lip seemed tight. LC encouraged mother to keep using the nipple shield to breastfeed infant and to continue supplement afterwards with pumped breastmilk or formula. LC also gave mother information about a local Pediatric dentist that she could contact for an evaluation of Angela Mccormick's top lip and tongue to rule out any restrictions. Mother verbalized understanding and denies any additional questions or concerns at this time.     Arlyss Gandy 02/21/2018, 3:36 PM

## 2018-02-25 ENCOUNTER — Encounter: Payer: Self-pay | Admitting: Obstetrics and Gynecology

## 2018-02-25 ENCOUNTER — Ambulatory Visit (INDEPENDENT_AMBULATORY_CARE_PROVIDER_SITE_OTHER): Payer: BLUE CROSS/BLUE SHIELD | Admitting: Obstetrics and Gynecology

## 2018-02-25 VITALS — BP 118/72 | Wt 202.0 lb

## 2018-02-25 DIAGNOSIS — Z4889 Encounter for other specified surgical aftercare: Secondary | ICD-10-CM

## 2018-02-28 NOTE — Progress Notes (Signed)
Postoperative Follow-up Patient presents post op from 1LTCS 12/17/2017 being followed for stitch abscess  Subjective: Patient reports some improvement in her preop symptoms. Eating a regular diet without difficulty. Pain is controlled without any medications.  Activity: normal activities of daily living.  Objective: Blood pressure 118/72, weight 202 lb (91.6 kg), currently breastfeeding.  Incision: Well healed other than the previously noted right later and midline defects in the incision.  The right lateral aspect is about 3cm in length, 0.5cm in depth.  The midline defects is about 1.5 x 0.5cm.  No discharge, no erythema  Admission on 12/15/2017, Discharged on 12/20/2017  Component Date Value Ref Range Status  . Specimen Description 12/15/2017    Final                   Value:VAGINAL/RECTAL Performed at [facility], [address] [place] [place], [place] [address]   . Special Requests 12/15/2017    Final                   Value:NONE Performed at [facility], [address] [place] [place], [place] [address]   . Culture 12/15/2017    Final                   Value:NO GROUP B STREP (S.AGALACTIAE) ISOLATED Performed at [facility], [address] [place], [place] [address]   . Report Status 12/15/2017 12/18/2017 FINAL   Final  . WBC 12/15/2017 7.9  3.6 - 11.0 K/uL Final  . RBC 12/15/2017 4.10  3.80 - 5.20 MIL/uL Final  . Hemoglobin 12/15/2017 12.4  12.0 - 16.0 g/dL Final  . HCT 91/47/8295 35.2  35.0 - 47.0 % Final  . MCV 12/15/2017 85.9  80.0 - 100.0 fL Final  . MCH 12/15/2017 30.3  26.0 - 34.0 pg Final  . MCHC 12/15/2017 35.2  32.0 - 36.0 g/dL Final  . RDW 62/13/0865 16.6* 11.5 - 14.5 % Final  . Platelets 12/15/2017 200  150 - 440 K/uL Final   Performed at [facility], [address] [place], [place] [address]  . RPR Ser Ql 12/15/2017 Non Reactive  Non Reactive Final   Comment: (NOTE) Performed At: [facility] [address]  [place], [place] [id number] Jolene Schimke MD KG:[id number]   . ABO/RH(D) 12/15/2017 A POS   Final  . Antibody Screen 12/15/2017 NEG   Final  . Sample Expiration 12/15/2017    Final                   Value:12/18/2017 Performed at [facility], [address], [place], [place] [address]   . Varicella 06/19/2017 Nonimmune   Final  . Glucose-Capillary 12/15/2017 200* 70 - 99 mg/dL Final  . Glucose-Capillary 12/15/2017 134* 70 - 99 mg/dL Final  . Glucose-Capillary 12/16/2017 115* 70 - 99 mg/dL Final  . Glucose-Capillary 12/16/2017 99  70 - 99 mg/dL Final  . Glucose-Capillary 12/16/2017 135* 70 - 99 mg/dL Final  . Glucose-Capillary 12/16/2017 148* 70 - 99 mg/dL Final  . WBC 47/42/5956 13.1* 3.6 - 11.0 K/uL Final  . RBC 12/17/2017 3.97  3.80 - 5.20 MIL/uL Final  . Hemoglobin 12/17/2017 11.9* 12.0 - 16.0 g/dL Final  . HCT 38/75/6433 34.8* 35.0 - 47.0 % Final  . MCV 12/17/2017 87.6  80.0 - 100.0 fL Final  . MCH 12/17/2017 29.9  26.0 - 34.0 pg Final  . MCHC 12/17/2017 34.1  32.0 - 36.0  g/dL Final  . RDW 16/02/9603 17.2* 11.5 - 14.5 % Final  . Platelets 12/17/2017 176  150 - 440 K/uL Final   Performed at [facility], [address] [place] [place], [place] [address]  . Glucose-Capillary 12/17/2017 137* 70 - 99 mg/dL Final  . Glucose-Capillary 12/17/2017 129* 70 - 99 mg/dL Final  . Glucose-Capillary 12/17/2017 127* 70 - 99 mg/dL Final  . WBC 11/91/4782 8.9  3.6 - 11.0 K/uL Final  . RBC 12/18/2017 3.23* 3.80 - 5.20 MIL/uL Final  . Hemoglobin 12/18/2017 9.6* 12.0 - 16.0 g/dL Final  . HCT 95/62/1308 28.4* 35.0 - 47.0 % Final  . MCV 12/18/2017 88.0  80.0 - 100.0 fL Final  . MCH 12/18/2017 29.8  26.0 - 34.0 pg Final  . MCHC 12/18/2017 33.8  32.0 - 36.0 g/dL Final  . RDW 65/78/4696 17.0* 11.5 - 14.5 % Final  . Platelets 12/18/2017 180  150 - 440 K/uL Final   Performed at [facility], [address], [place], [place] [address]  . Glucose-Capillary 12/17/2017 114* 70 - 99  mg/dL Final  . Glucose-Capillary 12/17/2017 114* 70 - 99 mg/dL Final  . Glucose-Capillary 12/18/2017 72  70 - 99 mg/dL Final    Assessment: 32 y.o. s/p 1LTCS stable  Plan: Patient has done well after surgery with no apparent complications.  I have discussed the post-operative course to date, and the expected progress moving forward.  The patient understands what complications to be concerned about.  I will see the patient in routine follow up, or sooner if needed.    Activity plan: No restriction.  Continue packing changes once daily   Vena Austria, MD, [facility], [facility] 02/28/2018, 9:32 AM

## 2018-03-04 ENCOUNTER — Encounter: Payer: Self-pay | Admitting: Family Medicine

## 2018-03-04 ENCOUNTER — Ambulatory Visit (INDEPENDENT_AMBULATORY_CARE_PROVIDER_SITE_OTHER): Payer: BLUE CROSS/BLUE SHIELD | Admitting: Family Medicine

## 2018-03-04 VITALS — BP 120/64 | HR 77 | Temp 98.1°F | Ht 62.5 in | Wt 198.6 lb

## 2018-03-04 DIAGNOSIS — Z0001 Encounter for general adult medical examination with abnormal findings: Secondary | ICD-10-CM | POA: Diagnosis not present

## 2018-03-04 DIAGNOSIS — E669 Obesity, unspecified: Secondary | ICD-10-CM | POA: Diagnosis not present

## 2018-03-04 DIAGNOSIS — T8189XA Other complications of procedures, not elsewhere classified, initial encounter: Secondary | ICD-10-CM | POA: Insufficient documentation

## 2018-03-04 DIAGNOSIS — K625 Hemorrhage of anus and rectum: Secondary | ICD-10-CM | POA: Diagnosis not present

## 2018-03-04 DIAGNOSIS — G43809 Other migraine, not intractable, without status migrainosus: Secondary | ICD-10-CM | POA: Diagnosis not present

## 2018-03-04 DIAGNOSIS — Z0000 Encounter for general adult medical examination without abnormal findings: Secondary | ICD-10-CM | POA: Insufficient documentation

## 2018-03-04 LAB — COMPREHENSIVE METABOLIC PANEL
ALT: 20 U/L (ref 0–35)
AST: 14 U/L (ref 0–37)
Albumin: 4.2 g/dL (ref 3.5–5.2)
Alkaline Phosphatase: 72 U/L (ref 39–117)
BUN: 15 mg/dL (ref 6–23)
CO2: 29 mEq/L (ref 19–32)
Calcium: 9.8 mg/dL (ref 8.4–10.5)
Chloride: 103 mEq/L (ref 96–112)
Creatinine, Ser: 0.75 mg/dL (ref 0.40–1.20)
GFR: 95.96 mL/min (ref >60.00)
Glucose, Bld: 82 mg/dL (ref 70–99)
Potassium: 4 mEq/L (ref 3.5–5.1)
Sodium: 139 mEq/L (ref 135–145)
Total Bilirubin: 0.4 mg/dL (ref 0.2–1.2)
Total Protein: 7.3 g/dL (ref 6.0–8.3)

## 2018-03-04 LAB — CBC
HCT: 37.7 % (ref 36.0–46.0)
Hemoglobin: 12.8 g/dL (ref 12.0–15.0)
MCHC: 33.9 g/dL (ref 30.0–36.0)
MCV: 83.9 fl (ref 78.0–100.0)
Platelets: 372 10*3/uL (ref 150.0–400.0)
RBC: 4.49 Mil/uL (ref 3.87–5.11)
RDW: 15.1 % (ref 11.5–15.5)
WBC: 8.5 10*3/uL (ref 4.0–10.5)

## 2018-03-04 LAB — HEMOGLOBIN A1C: Hgb A1c MFr Bld: 5.3 % (ref 4.6–6.5)

## 2018-03-04 MED ORDER — ALBUTEROL SULFATE HFA 108 (90 BASE) MCG/ACT IN AERS: 2.0000 | INHALATION_SPRAY | Freq: Four times a day (QID) | RESPIRATORY_TRACT | 1 refills | Status: DC | PRN

## 2018-03-04 NOTE — Progress Notes (Signed)
Tommi Rumps, MD Phone: [phone]  Angela Mccormick is a 32 y.o. female who presents today for cpe.  Exercises by walking daily. Diet consist of eating everything.  One can of soda daily. Pap smear 06/19/2017.  Follows with GYN for this.  No family history of colon cancer or ovarian cancer.  Paternal grandmother with breast cancer.  She does report her mom has a colostomy from removal of part of her intestines when she was 20 for bleeding. Tetanus vaccination and flu vaccination up-to-date.  HIV screening up-to-date. No tobacco use or illicit drug use.  Occasional alcohol use though none recently. Sees ophthalmology once yearly.  Sees a dentist every 3 months.  She underwent C-section about 3 months ago.  She has had a slow to heal wound.  She was treated with Keflex previously.  They have been using packing.  She notes some odor to it today.  Some drainage.  Drainage has not changed since initially having issues with this healing.  Has some discomfort that is unchanged.  Blood in her bowel movements: This has been going on for a week or so.  Some blood on her stool and when she wipes.  Does not drip into the toilet.  Chronic headaches: These did improve when she was pregnant though have returned since delivery.  They are similar to prior.  He is on magnesium to help with this.  She follows with neurology.  Active Ambulatory Problems    Diagnosis Date Noted  . 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 Migraines 09/26/2012  . Syncope 09/26/2012  . Nodule of neck 11/01/2016  . Supervision of high risk pregnancy, antepartum 06/19/2017  . Obesity affecting pregnancy, antepartum 06/19/2017  . Gestational diabetes mellitus (GDM) 11/21/2017  . Preterm premature rupture of membranes 12/15/2017  . Postpartum care following cesarean delivery 12/20/2017  . Encounter for general adult medical examination with abnormal findings 03/04/2018  . Rectal bleeding 03/04/2018  . Nonhealing surgical  wound 03/04/2018   Resolved Ambulatory Problems    Diagnosis Date Noted  . Rash 11/01/2016  . Elevated glucose 04/13/2017  . Trying to get pregnant 04/13/2017  . BMI 37.0-37.9, adult 04/13/2017  . Influenza 07/27/2017  . Nausea and vomiting during pregnancy 08/13/2017   Past Medical History:  Diagnosis Date  . Asthma   . Depression   . Gestational diabetes   . History of Chiari malformation   . History of syncope   . Migraine     Family History  Problem Relation Age of Onset  . Colon cancer Mother   . Heart disease Mother   . Diabetes Mother   . Diabetes Father     Social History   Socioeconomic History  . Marital status: Married    Spouse name: Not on file  . Number of children: Not on file  . Years of education: Not on file  . Highest education level: Not on file  Occupational History  . Not on file  Social Needs  . Financial resource strain: Not on file  . Food insecurity:    Worry: Not on file    Inability: Not on file  . Transportation needs:    Medical: Not on file    Non-medical: Not on file  Tobacco Use  . Smoking status: Never Smoker  . Smokeless tobacco: Never Used  Substance and Sexual Activity  . Alcohol use: No    Frequency: Never  . Drug use: No  . Sexual activity: Yes    Birth control/protection:  Pill  Lifestyle  . Physical activity:    Days per week: Not on file    Minutes per session: Not on file  . Stress: Not on file  Relationships  . Social connections:    Talks on phone: Not on file    Gets together: Not on file    Attends religious service: Not on file    Active member of club or organization: Not on file    Attends meetings of clubs or organizations: Not on file    Relationship status: Not on file  . Intimate partner violence:    Fear of current or ex partner: Not on file    Emotionally abused: Not on file    Physically abused: Not on file    Forced sexual activity: Not on file  Other Topics Concern  . Not on file    Social History Narrative  . Not on file    ROS  General:  Negative for nexplained weight loss, fever Skin: Positive for wound that will not heal negative for new or changing mole HEENT: Negative for trouble hearing, trouble seeing, ringing in ears, mouth sores, hoarseness, change in voice, dysphagia. CV:  Negative for chest pain, dyspnea, edema, palpitations Resp: Negative for cough, dyspnea, hemoptysis GI: Positive for hematochezia negative for nausea, vomiting, diarrhea, constipation, abdominal pain, melena. GU: Negative for dysuria, incontinence, urinary hesitance, hematuria, vaginal or penile discharge, polyuria, sexual difficulty, lumps in testicle or breasts MSK: Negative for muscle cramps or aches, joint pain or swelling Neuro: Positive for headaches, negative for weakness, numbness, dizziness, passing out/fainting Psych: Negative for depression, anxiety, memory problems  Objective  Physical Exam Vitals:   03/04/18 1341  BP: 120/64  Pulse: 77  Temp: 98.1 F (36.7 C)  SpO2: 96%    BP Readings from Last 3 Encounters:  03/04/18 120/64  02/25/18 118/72  02/18/18 126/78   Wt Readings from Last 3 Encounters:  03/04/18 198 lb 9.6 oz (90.1 kg)  02/25/18 202 lb (91.6 kg)  02/18/18 198 lb (89.8 kg)    Physical Exam  Constitutional: No distress.  HENT:  Head: Normocephalic and atraumatic.  Mouth/Throat: Oropharynx is clear and moist.  Eyes: Pupils are equal, round, and reactive to light. Conjunctivae are normal.  Neck: Neck supple.  Cardiovascular: Normal rate, regular rhythm and normal heart sounds.  Pulmonary/Chest: Effort normal and breath sounds normal.  Abdominal: Soft. Bowel sounds are normal. She exhibits no distension. There is no tenderness. There is no rebound and no guarding.  Genitourinary:  Genitourinary Comments: C-section scar observed with chaperone, right lateral aspect with open wound with packing in place, small amount of serosanguineous fluid on  the packing, no bleeding coming from the wound, no drainage, there is some tenderness, no induration or fluctuance, no surrounding erythema, centrally located wound smaller than the lateral wound with packing in place, no drainage, mild tenderness, no erythema, no fluctuance, no induration, rectal exam performed with chaperone, no abnormal findings externally, no stool in the rectal vault  Musculoskeletal: She exhibits no edema.  Lymphadenopathy:    She has no cervical adenopathy.  Neurological: She is alert.  Skin: Skin is warm and dry. She is not diaphoretic.     Assessment/Plan:   Encounter for general adult medical examination with abnormal findings Physical exam completed.  Encouraged activity as she is able to.  Encouraged to work on dietary changes.  Pelvic and breast exams deferred to gynecology.  Lab work as outlined below.  Migraines Chronic and stable.  She will continue to see neurology.  Rectal bleeding Patient with hematochezia.  Could be hemorrhoidal.  Will refer to GI for evaluation.  Given return precautions.  Check CBC.  Nonhealing surgical wound Slow to heal surgical wound.  Followed by gynecology.  There does not appear to be any evidence of infection at this time.  I discussed having the patient follow-up with her gynecologist as scheduled later this week.  If she develops signs of infection she will be reevaluated.   Orders Placed This Encounter  Procedures  . CBC  . Comp Met (CMET)  . HgB A1c  . Ambulatory referral to Gastroenterology    Referral Priority:   Routine    Referral Type:   Consultation    Referral Reason:   Specialty Services Required    Number of Visits Requested:   1    Meds ordered this encounter  Medications  . albuterol (PROVENTIL HFA;VENTOLIN HFA) 108 (90 Base) MCG/ACT inhaler    Sig: Inhale 2 puffs into the lungs every 6 (six) hours as needed for wheezing or shortness of breath.    Dispense:  18 g    Refill:  [place], MD [place]

## 2018-03-04 NOTE — Assessment & Plan Note (Signed)
Patient with hematochezia.  Could be hemorrhoidal.  Will refer to GI for evaluation.  Given return precautions.  Check CBC.

## 2018-03-04 NOTE — Assessment & Plan Note (Signed)
Slow to heal surgical wound.  Followed by gynecology.  There does not appear to be any evidence of infection at this time.  I discussed having the patient follow-up with her gynecologist as scheduled later this week.  If she develops signs of infection she will be reevaluated.

## 2018-03-04 NOTE — Patient Instructions (Signed)
Nice to see you. Please keep your appointment with your gynecologist later this week.  If you develop increasing discharge, pain, or if you develop redness please be reevaluated and contact her gynecologist for sooner follow-up. We will check lab work today and contact you with the results. We will have you see GI for your rectal bleeding.

## 2018-03-04 NOTE — Assessment & Plan Note (Signed)
Chronic and stable.  She will continue to see neurology.

## 2018-03-04 NOTE — Assessment & Plan Note (Signed)
Physical exam completed.  Encouraged activity as she is able to.  Encouraged to work on dietary changes.  Pelvic and breast exams deferred to gynecology.  Lab work as outlined below.

## 2018-03-06 ENCOUNTER — Encounter: Payer: Self-pay | Admitting: Gastroenterology

## 2018-03-06 ENCOUNTER — Ambulatory Visit (INDEPENDENT_AMBULATORY_CARE_PROVIDER_SITE_OTHER): Payer: BLUE CROSS/BLUE SHIELD | Admitting: Gastroenterology

## 2018-03-06 ENCOUNTER — Other Ambulatory Visit: Payer: Self-pay

## 2018-03-06 VITALS — BP 139/71 | HR 77 | Resp 17 | Ht 62.5 in | Wt 199.2 lb

## 2018-03-06 DIAGNOSIS — K625 Hemorrhage of anus and rectum: Secondary | ICD-10-CM

## 2018-03-06 NOTE — Patient Instructions (Signed)
High-Fiber Diet  Fiber, also called dietary fiber, is a type of carbohydrate found in fruits, vegetables, whole grains, and beans. A high-fiber diet can have many health benefits. Your health care provider may recommend a high-fiber diet to help:  · Prevent constipation. Fiber can make your bowel movements more regular.  · Lower your cholesterol.  · Relieve hemorrhoids, uncomplicated diverticulosis, or irritable bowel syndrome.  · Prevent overeating as part of a weight-loss plan.  · Prevent heart disease, type 2 diabetes, and certain cancers.    What is my plan?  The recommended daily intake of fiber includes:  · 38 grams for men under age 50.  · 30 grams for men over age 50.  · 25 grams for women under age 50.  · 21 grams for women over age 50.    You can get the recommended daily intake of dietary fiber by eating a variety of fruits, vegetables, grains, and beans. Your health care provider may also recommend a fiber supplement if it is not possible to get enough fiber through your diet.  What do I need to know about a high-fiber diet?  · Fiber supplements have not been widely studied for their effectiveness, so it is better to get fiber through food sources.  · Always check the fiber content on the nutrition facts label of any prepackaged food. Look for foods that contain at least 5 grams of fiber per serving.  · Ask your dietitian if you have questions about specific foods that are related to your condition, especially if those foods are not listed in the following section.  · Increase your daily fiber consumption gradually. Increasing your intake of dietary fiber too quickly may cause bloating, cramping, or gas.  · Drink plenty of water. Water helps you to digest fiber.  What foods can I eat?  Grains  Whole-grain breads. Multigrain cereal. Oats and oatmeal. Brown rice. Barley. Bulgur wheat. Millet. Bran muffins. Popcorn. Rye wafer crackers.  Vegetables   Sweet potatoes. Spinach. Kale. Artichokes. Cabbage. Broccoli. Angela Mccormick peas. Carrots. Squash.  Fruits  Berries. Pears. Apples. Oranges. Avocados. Prunes and raisins. Dried figs.  Meats and Other Protein Sources  Navy, kidney, pinto, and soy beans. Split peas. Lentils. Nuts and seeds.  Dairy  Fiber-fortified yogurt.  Beverages  Fiber-fortified soy milk. Fiber-fortified orange juice.  Other  Fiber bars.  The items listed above may not be a complete list of recommended foods or beverages. Contact your dietitian for more options.  What foods are not recommended?  Grains  White bread. Pasta made with refined flour. White rice.  Vegetables  Fried potatoes. Canned vegetables. Well-cooked vegetables.  Fruits  Fruit juice. Cooked, strained fruit.  Meats and Other Protein Sources  Fatty cuts of meat. Fried poultry or fried fish.  Dairy  Milk. Yogurt. Cream cheese. Sour cream.  Beverages  Soft drinks.  Other  Cakes and pastries. Butter and oils.  The items listed above may not be a complete list of foods and beverages to avoid. Contact your dietitian for more information.  What are some tips for including high-fiber foods in my diet?  · Eat a wide variety of high-fiber foods.  · Make sure that half of all grains consumed each day are whole grains.  · Replace breads and cereals made from refined flour or white flour with whole-grain breads and cereals.  · Replace white rice with brown rice, bulgur wheat, or millet.  · Start the day with a breakfast that is high in fiber,   such as a cereal that contains at least 5 grams of fiber per serving.  · Use beans in place of meat in soups, salads, or pasta.  · Eat high-fiber snacks, such as berries, raw vegetables, nuts, or popcorn.  This information is not intended to replace advice given to you by your health care provider. Make sure you discuss any questions you have with your health care provider.  Document Released: 05/01/2005 Document Revised: 10/07/2015 Document Reviewed: 10/14/2013   Elsevier Interactive Patient Education © 2018 [employer] Inc[employer]

## 2018-03-06 NOTE — Progress Notes (Signed)
Arlyss Repress, MD [address]  [address]  [place], [place] [address]  Main: [phone]  Fax: [phone]    Gastroenterology Consultation  Referring Provider:     Glori Luis, MD Primary Care Physician:  Glori Luis, MD Primary Gastroenterologist:  Dr. Arlyss Repress Reason for Consultation:     Rectal bleeding        HPI:   Angela Mccormick is a 32 y.o. female referred by Dr. Birdie Sons, Yehuda Mao, MD  for consultation & management of 1 week history of blood per rectum, she has been noticing some blood in her stool and when she wipes.  She denies abdominal pain, diarrhea. She says, bleeding has come down in last 2 days. No evidence of anemia. She just delivered preterm in 12/2017. She is recovering from C-section. She has altered bowel habits, between constipation and diarrhea. Takes imodium for diarrhea. Drinks sodas daily, diet devoid of fiber,eats snacks, fried foods  NSAIDs: None  Antiplts/Anticoagulants/Anti thrombotics: None  GI Procedures: none No fam h/o GI malignancy Mother has colostomy bag from perforated diverticulitis  Past Medical History:  Diagnosis Date  . Asthma   . Depression   . Gestational diabetes   . History of Chiari malformation   . History of syncope   . Migraine     Past Surgical History:  Procedure Laterality Date  . CESAREAN SECTION N/A 12/17/2017   Procedure: CESAREAN SECTION;  Surgeon: Vena Austria, MD;  Location: [facility] ORS;  Service: Obstetrics;  Laterality: N/A;  . NO PAST SURGERIES    . WISDOM TOOTH EXTRACTION      Current Outpatient Medications:  .  albuterol (PROVENTIL HFA;VENTOLIN HFA) 108 (90 Base) MCG/ACT inhaler, Inhale 2 puffs into the lungs every 6 (six) hours as needed for wheezing or shortness of breath., Disp: 18 g, Rfl: 1 .  magnesium oxide (MAG-OX) 400 (241.3 Mg) MG tablet, Take 1 tablet by mouth daily., Disp: , Rfl: 1 .  metoCLOPramide (REGLAN) 5 MG tablet, Take one tablet with Naproxen and  Tramadol as needed for migraine. Not to exceed two days per week, Disp: , Rfl:  .  Misc. Devices (BREAST PUMP) MISC, Dispense one breast pump for patient, Disp: 1 each, Rfl: 0 .  naproxen (NAPROSYN) 500 MG tablet, , Disp: , Rfl:  .  norethindrone (MICRONOR,CAMILA,ERRIN) 0.35 MG tablet, Take 1 tablet (0.35 mg total) by mouth daily., Disp: 1 Package, Rfl: 11 .  prenatal vitamin w/FE, FA (NATACHEW) 29-1 MG CHEW chewable tablet, Chew 1 tablet by mouth daily at 12 noon., Disp: , Rfl:  .  traMADol (ULTRAM) 50 MG tablet, Take 1 tablet with Reglan and naproxen as needed for Migraine. Not to exceed 2 days per week., Disp: , Rfl:  .  cephALEXin (KEFLEX) 500 MG capsule, Take 1 capsule (500 mg total) by mouth 4 (four) times daily. (Patient not taking: Reported on 03/06/2018), Disp: 28 capsule, Rfl: 0   Family History  Problem Relation Age of Onset  . Colon cancer Mother   . Heart disease Mother   . Diabetes Mother   . Diabetes Father      Social History   Tobacco Use  . Smoking status: Never Smoker  . Smokeless tobacco: Never Used  Substance Use Topics  . Alcohol use: No    Frequency: Never  . Drug use: No    Allergies as of 03/06/2018 - Review Complete 03/06/2018  Allergen Reaction Noted  . Sumatriptan succinate Anxiety 08/15/2016  . Other  03/04/2018    Review of Systems:    All systems reviewed and negative except where noted in HPI.   Physical Exam:  BP 139/71 (BP Location: Left Arm, Patient Position: Sitting, Cuff Size: Large)   Pulse 77   Resp 17   Ht 5' 2.5" (1.588 m)   Wt 199 lb 3.2 oz (90.4 kg)   LMP 02/15/2018   BMI 35.85 kg/m  Patient's last menstrual period was 02/15/2018.  General:   Alert,  Well-developed, well-nourished, pleasant and cooperative in NAD Head:  Normocephalic and atraumatic. Eyes:  Sclera clear, no icterus.   Conjunctiva pink. Ears:  Normal auditory acuity. Nose:  No deformity, discharge, or lesions. Mouth:  No deformity or lesions,oropharynx pink  & moist. Neck:  Supple; no masses or thyromegaly. Lungs:  Respirations even and unlabored.  Clear throughout to auscultation.   No wheezes, crackles, or rhonchi. No acute distress. Heart:  Regular rate and rhythm; no murmurs, clicks, rubs, or gallops. Abdomen:  Normal bowel sounds. Soft, obese, non-tender and non-distended without masses, hepatosplenomegaly or hernias noted.  No guarding or rebound tenderness.   Rectal: Not performed Msk:  Symmetrical without gross deformities. Good, equal movement & strength bilaterally. Pulses:  Normal pulses noted. Extremities:  No clubbing or edema.  No cyanosis. Neurologic:  Alert and oriented x3;  grossly normal neurologically. Skin:  Intact without significant lesions or rashes. No jaundice. Lymph Nodes:  No significant cervical adenopathy. Psych:  Alert and cooperative. Normal mood and affect.  Imaging Studies: none  Assessment and Plan:   Angela Mccormick is a 32 y.o. Caucasian female with obesity presents with one-week history of rectal bleeding, alternating between diarrhea and constipation.   Flex sig High fiber diet Fiber supplements   Follow up based on flex sig results   Arlyss Repress, MD

## 2018-03-07 ENCOUNTER — Ambulatory Visit (INDEPENDENT_AMBULATORY_CARE_PROVIDER_SITE_OTHER): Payer: BLUE CROSS/BLUE SHIELD | Admitting: Obstetrics and Gynecology

## 2018-03-07 ENCOUNTER — Encounter: Payer: Self-pay | Admitting: Obstetrics and Gynecology

## 2018-03-07 VITALS — BP 122/62 | HR 80 | Wt 200.0 lb

## 2018-03-07 DIAGNOSIS — Z4889 Encounter for other specified surgical aftercare: Secondary | ICD-10-CM

## 2018-03-07 MED ORDER — HYDROCODONE-ACETAMINOPHEN 5-325 MG PO TABS: 1.0000 | ORAL_TABLET | Freq: Four times a day (QID) | ORAL | 0 refills | Status: DC | PRN

## 2018-03-07 NOTE — Progress Notes (Signed)
      Postoperative Follow-up Patient presents post op from 1LTCS 12/17/2017 being followed for stitch abscess  Subjective: Patient reports some improvement in her preop symptoms. Eating a regular diet without difficulty. Still has pain with packing changes but otherwise doing well.  Activity: normal activities of daily living.  Objective: Blood pressure 122/62, pulse 80, weight 200 lb (90.7 kg), last menstrual period 02/15/2018, currently breastfeeding.  General: NAD Pulmonary: no increased work of breathing Abdomen: soft, non-tender, non-distended, incision with previously noted defects.  The midline defect is mostly granulated in and was not re-packed.  The right lateral appears to be improving with healthy granulation tissue about 0.69mm in depth 1.5cm in length.   Extremities: no edema Neurologic: normal gait    Office Visit on 03/04/2018  Component Date Value Ref Range Status  . WBC 03/04/2018 8.5  4.0 - 10.5 K/uL Final  . RBC 03/04/2018 4.49  3.87 - 5.11 Mil/uL Final  . Platelets 03/04/2018 372.0  150.0 - 400.0 K/uL Final  . Hemoglobin 03/04/2018 12.8  12.0 - 15.0 g/dL Final  . HCT 16/02/9603 37.7  36.0 - 46.0 % Final  . MCV 03/04/2018 83.9  78.0 - 100.0 fl Final  . MCHC 03/04/2018 33.9  30.0 - 36.0 g/dL Final  . RDW 54/01/8118 15.1  11.5 - 15.5 % Final  . Sodium 03/04/2018 139  135 - 145 mEq/L Final  . Potassium 03/04/2018 4.0  3.5 - 5.1 mEq/L Final  . Chloride 03/04/2018 103  96 - 112 mEq/L Final  . CO2 03/04/2018 29  19 - 32 mEq/L Final  . Glucose, Bld 03/04/2018 82  70 - 99 mg/dL Final  . BUN 14/78/2956 15  6 - 23 mg/dL Final  . Creatinine, Ser 03/04/2018 0.75  0.40 - 1.20 mg/dL Final  . Total Bilirubin 03/04/2018 0.4  0.2 - 1.2 mg/dL Final  . Alkaline Phosphatase 03/04/2018 72  39 - 117 U/L Final  . AST 03/04/2018 14  0 - 37 U/L Final  . ALT 03/04/2018 20  0 - 35 U/L Final  . Total Protein 03/04/2018 7.3  6.0 - 8.3 g/dL Final  . Albumin 21/30/8657 4.2  3.5 - 5.2  g/dL Final  . Calcium 84/69/6295 9.8  8.4 - 10.5 mg/dL Final  . GFR 28/41/3244 95.96  >60.00 mL/min Final  . Hgb A1c MFr Bld 03/04/2018 5.3  4.6 - 6.5 % Final   Glycemic Control Guidelines for People with Diabetes:Non Diabetic:  <6%Goal of Therapy: <7%Additional Action Suggested:  >8%     Assessment: 32 y.o. s/p 1LTCS with stitch abscess stable  Plan: Patient has done well after surgery with no apparent complications.  I have discussed the post-operative course to date, and the expected progress moving forward.  The patient understands what complications to be concerned about.  I will see the patient in routine follow up, or sooner if needed.    Activity plan: No restriction.  Refill Vicodin for dressing changes   Vena Austria, MD, [facility], [facility]

## 2018-03-11 ENCOUNTER — Encounter: Payer: Self-pay | Admitting: Student

## 2018-03-12 ENCOUNTER — Encounter: Payer: Self-pay | Admitting: Anesthesiology

## 2018-03-12 ENCOUNTER — Ambulatory Visit: Payer: BLUE CROSS/BLUE SHIELD | Admitting: Anesthesiology

## 2018-03-12 ENCOUNTER — Encounter
Admission: RE | Disposition: A | Discharge status: Home / Self Care | Payer: Self-pay | Source: Ambulatory Visit | Attending: Gastroenterology

## 2018-03-12 ENCOUNTER — Ambulatory Visit
Admission: RE | Admit: 2018-03-12 | Discharge: 2018-03-12 | Disposition: A | Discharge status: Home / Self Care | Payer: BLUE CROSS/BLUE SHIELD | Source: Ambulatory Visit | Attending: Gastroenterology | Admitting: Gastroenterology

## 2018-03-12 DIAGNOSIS — G43909 Migraine, unspecified, not intractable, without status migrainosus: Secondary | ICD-10-CM | POA: Diagnosis not present

## 2018-03-12 DIAGNOSIS — K625 Hemorrhage of anus and rectum: Secondary | ICD-10-CM

## 2018-03-12 DIAGNOSIS — J45909 Unspecified asthma, uncomplicated: Secondary | ICD-10-CM | POA: Insufficient documentation

## 2018-03-12 DIAGNOSIS — F329 Major depressive disorder, single episode, unspecified: Secondary | ICD-10-CM | POA: Diagnosis not present

## 2018-03-12 DIAGNOSIS — Z79899 Other long term (current) drug therapy: Secondary | ICD-10-CM | POA: Insufficient documentation

## 2018-03-12 HISTORY — PX: FLEXIBLE SIGMOIDOSCOPY: SHX5431

## 2018-03-12 LAB — POCT PREGNANCY, URINE: Preg Test, Ur: NEGATIVE

## 2018-03-12 SURGERY — SIGMOIDOSCOPY, FLEXIBLE
Anesthesia: General

## 2018-03-12 MED ORDER — PROPOFOL 10 MG/ML IV BOLUS
INTRAVENOUS | Status: DC | PRN
  Administered 2018-03-12 (×2): 10 mg via INTRAVENOUS
  Administered 2018-03-12: 50 mg via INTRAVENOUS
  Administered 2018-03-12: 10 mg via INTRAVENOUS
  Administered 2018-03-12: 20 mg via INTRAVENOUS

## 2018-03-12 MED ORDER — PROPOFOL 10 MG/ML IV BOLUS
INTRAVENOUS | Status: AC
  Filled 2018-03-12: qty 20

## 2018-03-12 MED ORDER — SODIUM CHLORIDE 0.9 % IV SOLN
INTRAVENOUS | Status: DC
  Administered 2018-03-12: 15:00:00 via INTRAVENOUS

## 2018-03-12 MED ORDER — LIDOCAINE HCL (CARDIAC) PF 100 MG/5ML IV SOSY
PREFILLED_SYRINGE | INTRAVENOUS | Status: DC | PRN
  Administered 2018-03-12: 50 mg via INTRAVENOUS

## 2018-03-12 MED ORDER — PHENYLEPHRINE HCL 10 MG/ML IJ SOLN
INTRAMUSCULAR | Status: DC | PRN
  Administered 2018-03-12 (×2): 100 ug via INTRAVENOUS

## 2018-03-12 NOTE — Transfer of Care (Signed)
Immediate Anesthesia Transfer of Care Note  Patient: Angela Mccormick  Procedure(s) Performed: FLEXIBLE SIGMOIDOSCOPY (N/A )  Patient Location: PACU  Anesthesia Type:MAC  Level of Consciousness: drowsy  Airway & Oxygen Therapy: Patient Spontanous Breathing  Post-op Assessment: Report given to RN and Post -op Vital signs reviewed and stable  Post vital signs: Reviewed and stable  Last Vitals:  Vitals Value Taken Time  BP 90/41 03/12/2018  3:05 PM  Temp 36.4 C 03/12/2018  3:05 PM  Pulse 65 03/12/2018  3:06 PM  Resp 16 03/12/2018  3:06 PM  SpO2 100 % 03/12/2018  3:06 PM  Vitals shown include unvalidated device data.  Last Pain:  Vitals:   03/12/18 1505  TempSrc: Tympanic  PainSc: Asleep         Complications: No apparent anesthesia complications

## 2018-03-12 NOTE — Op Note (Signed)
[facility] Gastroenterology Patient Name: Angela Mccormick Procedure Date: 03/12/2018 2:48 PM MRN: [id number] Account #: [id number] Date of Birth: 13-Jul-1985 Admit Type: Inpatient Age: 32 Room: [facility] ENDO ROOM 1 Gender: Female Note Status: Finalized Procedure:            Flexible Sigmoidoscopy Indications:          Rectal hemorrhage Providers:            Lin Landsman MD, MD Medicines:            Monitored Anesthesia Care Complications:        No immediate complications. Estimated blood loss: None. Procedure:            Pre-Anesthesia Assessment:                       - Prior to the procedure, a History and Physical was                        performed, and patient medications and allergies were                        reviewed. The patient is competent. The risks and                        benefits of the procedure and the sedation options and                        risks were discussed with the patient. All questions                        were answered and informed consent was obtained.                        Patient identification and proposed procedure were                        verified by the physician, the nurse, the                        anesthesiologist, the anesthetist and the technician in                        the pre-procedure area in the procedure room in the                        endoscopy suite. Mental Status Examination: alert and                        oriented. Airway Examination: normal oropharyngeal                        airway and neck mobility. Respiratory Examination:                        clear to auscultation. CV Examination: normal.                        Prophylactic Antibiotics: The patient does not require  prophylactic antibiotics. Prior Anticoagulants: The                        patient has taken no previous anticoagulant or                        antiplatelet agents. ASA Grade Assessment: II - A                      patient with mild systemic disease. After reviewing the                        risks and benefits, the patient was deemed in                        satisfactory condition to undergo the procedure. The                        anesthesia plan was to use monitored anesthesia care                        (MAC). Immediately prior to administration of                        medications, the patient was re-assessed for adequacy                        to receive sedatives. The heart rate, respiratory rate,                        oxygen saturations, blood pressure, adequacy of                        pulmonary ventilation, and response to care were                        monitored throughout the procedure. The physical status                        of the patient was re-assessed after the procedure.                       After obtaining informed consent, the scope was passed                        under direct vision. The Endoscope was introduced                        through the anus and advanced to the the splenic                        flexure. The flexible sigmoidoscopy was accomplished                        without difficulty. The patient tolerated the procedure                        well. The quality of the bowel preparation was adequate. Findings:      The perianal and digital rectal examinations were normal. Pertinent  negatives include normal sphincter tone and no palpable rectal lesions.      The entire examined colon appeared normal. Impression:           - The entire examined colon is normal.                       - No specimens collected. Recommendation:       - Discharge patient to home (with escort).                       - Continue present medications.                       - Return to GI clinic as needed. Procedure Code(s):    --- Professional ---                       [phone], Sigmoidoscopy, flexible; diagnostic, including                        collection  of specimen(s) by brushing or washing, when                        performed (separate procedure) Diagnosis Code(s):    --- Professional ---                       K62.5, Hemorrhage of anus and rectum CPT copyright 2018 American Medical Association. All rights reserved. The codes documented in this report are preliminary and upon coder review may  be revised to meet current compliance requirements. Dr. Ulyess Mort Lin Landsman MD, MD 03/12/2018 3:02:17 PM This report has been signed electronically. Number of Addenda: 0 Note Initiated On: 03/12/2018 2:48 PM Total Procedure Duration: 0 hours 4 minutes 35 seconds       [facility]

## 2018-03-12 NOTE — Anesthesia Preprocedure Evaluation (Signed)
Anesthesia Evaluation  Patient identified by MRN, date of birth, ID band Patient awake    Reviewed: Allergy & Precautions, H&P , NPO status , Patient's Chart, lab work & pertinent test results  History of Anesthesia Complications Negative for: history of anesthetic complications  Airway Mallampati: III  TM Distance: <3 FB Neck ROM: full    Dental  (+) Chipped   Pulmonary neg pulmonary ROS, asthma ,           Cardiovascular Exercise Tolerance: Good (-) angina(-) Past MI and (-) DOE negative cardio ROS       Neuro/Psych  Headaches, PSYCHIATRIC DISORDERS    GI/Hepatic negative GI ROS, Neg liver ROS, neg GERD  ,  Endo/Other  diabetes, Type 2  Renal/GU negative Renal ROS  negative genitourinary   Musculoskeletal   Abdominal   Peds  Hematology negative hematology ROS (+)   Anesthesia Other Findings Past Medical History: No date: Asthma No date: Depression No date: Gestational diabetes No date: History of Chiari malformation No date: History of syncope No date: Migraine  Past Surgical History: 12/17/2017: CESAREAN SECTION; N/A     Comment:  Procedure: CESAREAN SECTION;  Surgeon: Vena Austria, MD;  Location: [facility] ORS;  Service: Obstetrics;                Laterality: N/A; No date: NO PAST SURGERIES No date: WISDOM TOOTH EXTRACTION  BMI    Body Mass Index:  35.67 kg/m      Reproductive/Obstetrics negative OB ROS                             Anesthesia Physical Anesthesia Plan  ASA: III  Anesthesia Plan: General   Post-op Pain Management:    Induction: Intravenous  PONV Risk Score and Plan: Propofol infusion and TIVA  Airway Management Planned: Natural Airway and Nasal Cannula  Additional Equipment:   Intra-op Plan:   Post-operative Plan:   Informed Consent: I have reviewed the patients History and Physical, chart, labs and discussed the procedure  including the risks, benefits and alternatives for the proposed anesthesia with the patient or authorized representative who has indicated his/her understanding and acceptance.   Dental Advisory Given  Plan Discussed with: Anesthesiologist, CRNA and Surgeon  Anesthesia Plan Comments: (Patient consented for risks of anesthesia including but not limited to:  - adverse reactions to medications - risk of intubation if required - damage to teeth, lips or other oral mucosa - sore throat or hoarseness - Damage to heart, brain, lungs or loss of life  Patient voiced understanding.)        Anesthesia Quick Evaluation

## 2018-03-12 NOTE — H&P (Signed)
Arlyss Repress, MD [address]  [address]  [place], [place] [address]  Main: [phone]  Fax: [phone] Pager: [phone]  Primary Care Physician:  Glori Luis, MD Primary Gastroenterologist:  Dr. Arlyss Repress  Pre-Procedure History & Physical: HPI:  Angela Mccormick is a 32 y.o. female is here for an flexible sigmoidoscopy.   Past Medical History:  Diagnosis Date  . Asthma   . Depression   . Gestational diabetes   . History of Chiari malformation   . History of syncope   . Migraine     Past Surgical History:  Procedure Laterality Date  . CESAREAN SECTION N/A 12/17/2017   Procedure: CESAREAN SECTION;  Surgeon: Vena Austria, MD;  Location: [facility] ORS;  Service: Obstetrics;  Laterality: N/A;  . NO PAST SURGERIES    . WISDOM TOOTH EXTRACTION      Prior to Admission medications   Medication Sig Start Date End Date Taking? Authorizing Provider  albuterol (PROVENTIL HFA;VENTOLIN HFA) 108 (90 Base) MCG/ACT inhaler Inhale 2 puffs into the lungs every 6 (six) hours as needed for wheezing or shortness of breath. 03/04/18   Glori Luis, MD  cephALEXin (KEFLEX) 500 MG capsule Take 1 capsule (500 mg total) by mouth 4 (four) times daily. Patient not taking: Reported on 03/06/2018 02/12/18   Nadara Mustard, MD  HYDROcodone-acetaminophen (NORCO/VICODIN) 5-325 MG tablet Take 1 tablet by mouth every 6 (six) hours as needed. 03/07/18   Vena Austria, MD  magnesium oxide (MAG-OX) 400 (241.3 Mg) MG tablet Take 1 tablet by mouth daily. 02/23/18   [provider]  metoCLOPramide (REGLAN) 5 MG tablet Take one tablet with Naproxen and Tramadol as needed for migraine. Not to exceed two days per week 12/06/16   [provider]  Misc. Devices (BREAST PUMP) MISC Dispense one breast pump for patient 11/07/17   Tresea Mall, CNM  naproxen (NAPROSYN) 500 MG tablet  01/09/18   [provider]  norethindrone (MICRONOR,CAMILA,ERRIN) 0.35 MG tablet  Take 1 tablet (0.35 mg total) by mouth daily. 01/11/18   Tresea Mall, CNM  prenatal vitamin w/FE, FA (NATACHEW) 29-1 MG CHEW chewable tablet Chew 1 tablet by mouth daily at 12 noon.    [provider]  traMADol (ULTRAM) 50 MG tablet Take 1 tablet with Reglan and naproxen as needed for Migraine. Not to exceed 2 days per week. 08/30/16   [provider]    Allergies as of 03/07/2018 - Review Complete 03/06/2018  Allergen Reaction Noted  . Sumatriptan succinate Anxiety 08/15/2016  . Other  03/04/2018    Family History  Problem Relation Age of Onset  . Colon cancer Mother   . Heart disease Mother   . Diabetes Mother   . Diabetes Father     Social History   Socioeconomic History  . Marital status: Married    Spouse name: Not on file  . Number of children: Not on file  . Years of education: Not on file  . Highest education level: Not on file  Occupational History  . Not on file  Social Needs  . Financial resource strain: Not on file  . Food insecurity:    Worry: Not on file    Inability: Not on file  . Transportation needs:    Medical: Not on file    Non-medical: Not on file  Tobacco Use  . Smoking status: Never Smoker  . Smokeless tobacco: Never Used  Substance and Sexual Activity  . Alcohol use: No  Frequency: Never  . Drug use: No  . Sexual activity: Yes    Birth control/protection: Pill  Lifestyle  . Physical activity:    Days per week: Not on file    Minutes per session: Not on file  . Stress: Not on file  Relationships  . Social connections:    Talks on phone: Not on file    Gets together: Not on file    Attends religious service: Not on file    Active member of club or organization: Not on file    Attends meetings of clubs or organizations: Not on file    Relationship status: Not on file  . Intimate partner violence:    Fear of current or ex partner: Not on file    Emotionally abused: Not on file    Physically abused: Not on file     Forced sexual activity: Not on file  Other Topics Concern  . Not on file  Social History Narrative  . Not on file    Review of Systems: See HPI, otherwise negative ROS  Physical Exam: BP 115/78   Pulse 71   Temp (!) 97.2 F (36.2 C) (Tympanic)   Resp 18   Ht 5\' 2"  (1.575 m)   Wt 195 lb (88.5 kg)   LMP 02/15/2018 (Exact Date)   SpO2 100%   Breastfeeding? Yes   BMI 35.67 kg/m  General:   Alert,  pleasant and cooperative in NAD Head:  Normocephalic and atraumatic. Neck:  Supple; no masses or thyromegaly. Lungs:  Clear throughout to auscultation.    Heart:  Regular rate and rhythm. Abdomen:  Soft, nontender and nondistended. Normal bowel sounds, without guarding, and without rebound.   Neurologic:  Alert and  oriented x4;  grossly normal neurologically.  Impression/Plan: Angela Mccormick is here for an flexible sigmoidoscopy to be performed for rectal bleeding  Risks, benefits, limitations, and alternatives regarding  flexible sigmoidoscopy have been reviewed with the patient.  Questions have been answered.  All parties agreeable.   Lannette Donath, MD  03/12/2018, 2:26 PM

## 2018-03-12 NOTE — Anesthesia Post-op Follow-up Note (Signed)
Anesthesia QCDR form completed.        

## 2018-03-13 ENCOUNTER — Encounter: Payer: Self-pay | Admitting: Gastroenterology

## 2018-03-13 NOTE — Anesthesia Postprocedure Evaluation (Signed)
Anesthesia Post Note  Patient: [name]  Procedure(s) Performed: FLEXIBLE SIGMOIDOSCOPY (N/A )  Patient location during evaluation: Endoscopy Anesthesia Type: General Level of consciousness: awake and alert Pain management: pain level controlled Vital Signs Assessment: post-procedure vital signs reviewed and stable Respiratory status: spontaneous breathing, nonlabored ventilation, respiratory function stable and patient connected to nasal cannula oxygen Cardiovascular status: blood pressure returned to baseline and stable Postop Assessment: no apparent nausea or vomiting Anesthetic complications: no     Last Vitals:  Vitals:   03/12/18 1515 03/12/18 1525  BP: (!) 155/87 (!) 166/85  Pulse:    Resp:    Temp:    SpO2:      Last Pain:  Vitals:   03/12/18 1525  TempSrc:   PainSc: 0-No pain                 Cleda Mccreedy Hilma Steinhilber

## 2018-03-14 ENCOUNTER — Ambulatory Visit (INDEPENDENT_AMBULATORY_CARE_PROVIDER_SITE_OTHER): Payer: BLUE CROSS/BLUE SHIELD | Admitting: Obstetrics and Gynecology

## 2018-03-14 ENCOUNTER — Encounter: Payer: Self-pay | Admitting: Obstetrics and Gynecology

## 2018-03-14 VITALS — BP 126/74 | HR 75 | Wt 199.0 lb

## 2018-03-14 DIAGNOSIS — Z4889 Encounter for other specified surgical aftercare: Secondary | ICD-10-CM

## 2018-03-26 NOTE — Progress Notes (Signed)
      Postoperative Follow-up Patient presents post op from cesarean 10weeks ago with stitch abscess  Subjective: Patient reports marked improvement in her preop symptoms. Eating a regular diet without difficulty. Pain is controlled without any medications.  Activity: normal activities of daily living.  Objective: Blood pressure 126/74, pulse 75, weight 199 lb (90.3 kg), last menstrual period 02/15/2018, currently breastfeeding.  General: NAD Pulmonary: no increased work of breathing Abdomen: soft, non-tender, non-distended, incision(s) D/C/I.  The area of wound disruption from the stitch/staple abscess have granulated in nicely Extremities: no edema Neurologic: normal gait    Admission on 03/12/2018, Discharged on 03/12/2018  Component Date Value Ref Range Status  . Preg Test, Ur 03/12/2018 NEGATIVE  NEGATIVE Final   Comment:        THE SENSITIVITY OF THIS METHODOLOGY IS >24 mIU/mL     Assessment: 32 y.o. s/p cesarean section stable  Plan: Patient has done well after surgery with no apparent complications.  I have discussed the post-operative course to date, and the expected progress moving forward.  The patient understands what complications to be concerned about.  I will see the patient in routine follow up, or sooner if needed.    Activity plan: No restriction.  No further packing  Return in about 1 year (around 03/15/2019) for annual.    Vena Austria, MD, [facility], [facility]

## 2018-04-01 ENCOUNTER — Other Ambulatory Visit: Payer: BLUE CROSS/BLUE SHIELD

## 2018-04-15 ENCOUNTER — Other Ambulatory Visit: Payer: BLUE CROSS/BLUE SHIELD

## 2018-04-15 DIAGNOSIS — O24414 Gestational diabetes mellitus in pregnancy, insulin controlled: Secondary | ICD-10-CM

## 2018-04-16 LAB — GLUCOSE TOLERANCE, 2 HOURS W/ 1HR
Glucose, 1 hour: 175 mg/dL (ref 65–179)
Glucose, 2 hour: 114 mg/dL (ref 65–152)
Glucose, Fasting: 104 mg/dL — ABNORMAL HIGH (ref 65–91)

## 2018-04-22 ENCOUNTER — Encounter: Payer: Self-pay | Admitting: Family Medicine

## 2018-04-25 ENCOUNTER — Encounter: Payer: Self-pay | Admitting: Internal Medicine

## 2018-04-25 ENCOUNTER — Ambulatory Visit (INDEPENDENT_AMBULATORY_CARE_PROVIDER_SITE_OTHER): Payer: BLUE CROSS/BLUE SHIELD | Admitting: Internal Medicine

## 2018-04-25 VITALS — BP 110/72 | HR 79 | Temp 98.1°F | Resp 16 | Ht 62.0 in | Wt 200.6 lb

## 2018-04-25 DIAGNOSIS — J029 Acute pharyngitis, unspecified: Secondary | ICD-10-CM | POA: Diagnosis not present

## 2018-04-25 DIAGNOSIS — H6503 Acute serous otitis media, bilateral: Secondary | ICD-10-CM

## 2018-04-25 LAB — POCT RAPID STREP A (OFFICE): Rapid Strep A Screen: NEGATIVE

## 2018-04-25 MED ORDER — AMOXICILLIN-POT CLAVULANATE 875-125 MG PO TABS: 1.0000 | ORAL_TABLET | Freq: Two times a day (BID) | ORAL | 0 refills | Status: DC

## 2018-04-25 MED ORDER — PREDNISONE 10 MG PO TABS: ORAL_TABLET | ORAL | 0 refills | Status: DC

## 2018-04-25 NOTE — Progress Notes (Signed)
Subjective:  Patient ID: Angela Mccormick, female    DOB: [birth year]  Age: 32 y.o. MRN: [id number]  CC: The primary encounter diagnosis was Sore throat. A diagnosis of Non-recurrent acute serous otitis media of both ears was also pertinent to this visit.  HPI [name] presents for eval and treatment of acute illness.  She developed a sore throat and Tmax of 99  About 7 days ago .  Denies body aches.  Sinus congestion and drainage of clear usually clear sputum.  Has history of asthma but denies wheezing .  Symptoms include Cough,  PND,  Sputum,  And ear fullness . Feeling Nausea/queasy .  Last loose stool was Tuesday.  No stools since then    35 month old son was diagnosed with RSV last week    Outpatient Medications Prior to Visit  Medication Sig Dispense Refill  . albuterol (PROVENTIL HFA;VENTOLIN HFA) 108 (90 Base) MCG/ACT inhaler Inhale 2 puffs into the lungs every 6 (six) hours as needed for wheezing or shortness of breath. 18 g 1  . magnesium oxide (MAG-OX) 400 (241.3 Mg) MG tablet Take 1 tablet by mouth daily.  1  . metoCLOPramide (REGLAN) 5 MG tablet Take one tablet with Naproxen and Tramadol as needed for migraine. Not to exceed two days per week    . Misc. Devices (BREAST PUMP) MISC Dispense one breast pump for patient 1 each 0  . naproxen (NAPROSYN) 500 MG tablet     . norethindrone (MICRONOR,CAMILA,ERRIN) 0.35 MG tablet Take 1 tablet (0.35 mg total) by mouth daily. 1 Package 11  . prenatal vitamin w/FE, FA (NATACHEW) 29-1 MG CHEW chewable tablet Chew 1 tablet by mouth daily at 12 noon.    . traMADol (ULTRAM) 50 MG tablet Take 1 tablet with Reglan and naproxen as needed for Migraine. Not to exceed 2 days per week.    . cephALEXin (KEFLEX) 500 MG capsule Take 1 capsule (500 mg total) by mouth 4 (four) times daily. 28 capsule 0  . HYDROcodone-acetaminophen (NORCO/VICODIN) 5-325 MG tablet Take 1 tablet by mouth every 6 (six) hours as needed. 15 tablet 0   No  facility-administered medications prior to visit.     Review of Systems;  Patient denies headache, fevers, malaise, unintentional weight loss, skin rash, eye pain, sinus congestion and sinus pain, sore throat, dysphagia,  hemoptysis , cough, dyspnea, wheezing, chest pain, palpitations, orthopnea, edema, abdominal pain, nausea, melena, diarrhea, constipation, flank pain, dysuria, hematuria, urinary  Frequency, nocturia, numbness, tingling, seizures,  Focal weakness, Loss of consciousness,  Tremor, insomnia, depression, anxiety, and suicidal ideation.      Objective:  BP 110/72 (BP Location: Left Arm, Patient Position: Sitting, Cuff Size: Large)   Pulse 79   Temp 98.1 F (36.7 C) (Oral)   Resp 16   Ht 5\' 2"  (1.575 m)   Wt 200 lb 9.6 oz (91 kg)   SpO2 99%   BMI 36.69 kg/m   BP Readings from Last 3 Encounters:  04/25/18 110/72  03/14/18 126/74  03/12/18 (!) 166/85    Wt Readings from Last 3 Encounters:  04/25/18 200 lb 9.6 oz (91 kg)  03/14/18 199 lb (90.3 kg)  03/12/18 195 lb (88.5 kg)    General appearance: alert, cooperative and appears stated age Ears: normal TM's and external ear canals both ears Throat: lips, mucosa, and tongue normal; teeth and gums normal Neck: no adenopathy, no carotid bruit, supple, symmetrical, trachea midline and thyroid not enlarged, symmetric, no tenderness/mass/nodules Back:  symmetric, no curvature. ROM normal. No CVA tenderness. Lungs: clear to auscultation bilaterally Heart: regular rate and rhythm, S1, S2 normal, no murmur, click, rub or gallop Abdomen: soft, non-tender; bowel sounds normal; no masses,  no organomegaly Pulses: 2+ and symmetric Skin: Skin color, texture, turgor normal. No rashes or lesions Lymph nodes: Cervical, supraclavicular, and axillary nodes normal.  Lab Results  Component Value Date   HGBA1C 5.3 03/04/2018   HGBA1C 5.2 06/19/2017   HGBA1C 5.2 04/13/2017    Lab Results  Component Value Date   CREATININE 0.75  03/04/2018   CREATININE 0.73 04/13/2017   CREATININE 0.70 10/31/2016    Lab Results  Component Value Date   WBC 8.5 03/04/2018   HGB 12.8 03/04/2018   HCT 37.7 03/04/2018   PLT 372.0 03/04/2018   GLUCOSE 82 03/04/2018   CHOL 140 04/13/2017   TRIG 77.0 04/13/2017   HDL 47.80 04/13/2017   LDLCALC 76 04/13/2017   ALT 20 03/04/2018   AST 14 03/04/2018   NA 139 03/04/2018   K 4.0 03/04/2018   CL 103 03/04/2018   CREATININE 0.75 03/04/2018   BUN 15 03/04/2018   CO2 29 03/04/2018   GLUF 94 11/19/2017   HGBA1C 5.3 03/04/2018    No results found.  Assessment & Plan:   Problem List Items Addressed This Visit    Otitis media    Strep test Is negative. Prednisone, decongestants and antibiotics prescribed      Relevant Medications   amoxicillin-clavulanate (AUGMENTIN) 875-125 MG tablet    Other Visit Diagnoses    Sore throat    -  Primary   Relevant Orders   POCT rapid strep A (Completed)    A total of 25 minutes of face to face time was spent with patient more than half of which was spent in counselling about the above mentioned conditions  and coordination of care   I have discontinued [place]any M. Croslin's cephALEXin and HYDROcodone-acetaminophen. I am also having her start on amoxicillin-clavulanate and predniSONE. Additionally, I am having her maintain her (prenatal vitamin w/FE, FA), Breast Pump, norethindrone, traMADol, naproxen, metoCLOPramide, albuterol, and magnesium oxide.  Meds ordered this encounter  Medications  . amoxicillin-clavulanate (AUGMENTIN) 875-125 MG tablet    Sig: Take 1 tablet by mouth 2 (two) times daily.    Dispense:  14 tablet    Refill:  0  . predniSONE (DELTASONE) 10 MG tablet    Sig: 6 tablets on Day 1 , then reduce by 1 tablet daily until gone    Dispense:  21 tablet    Refill:  0    Medications Discontinued During This Encounter  Medication Reason  . cephALEXin (KEFLEX) 500 MG capsule Completed Course  . HYDROcodone-acetaminophen  (NORCO/VICODIN) 5-325 MG tablet Completed Course    Follow-up: No follow-ups on file.   Sherlene Shamseresa L Hiyab Nhem, MD

## 2018-04-25 NOTE — Patient Instructions (Addendum)
You have a viral infection complicated by bronchitis and otitis .  I recommend a round of antibiotics to make sure the otitis does not progress,  Along with a 6 day  tapering dose of prednisone to prevent bronchitis becoming an asthma exacerbation.  Start the prednisone tomorrow.  You can use Sudafed PE for the nasal and ear congestions  10 mg every 6 hours as needed , available OTC      DO NOT force your cough or do things to aggravate it (exercise).   I am prescribing a cough suppressant both in pill form and liquid form to manage your cough:  Guaifenesin with codeine  at night for the cough and the tessalon capsules During the day for cough :  Gargle with salt water as needed for sore throat.   Start the prednisone taper tomorrow .  60 mg on Day 1,  50mg  on Day ,  40 g on Day 3  Etc until gone (6 days )  If you do not feel better in 4 or 5 days ,  I recommend that you call the office  to come In and get a chest x ray   .Daily use of Probiotics for  3 weeks advised to reduce risk of C dificile colitis.

## 2018-04-27 DIAGNOSIS — H6690 Otitis media, unspecified, unspecified ear: Secondary | ICD-10-CM | POA: Insufficient documentation

## 2018-04-27 NOTE — Assessment & Plan Note (Addendum)
Strep test Is negative. Prednisone, decongestants and antibiotics prescribed

## 2018-06-07 DIAGNOSIS — Z6835 Body mass index (BMI) 35.0-35.9, adult: Secondary | ICD-10-CM | POA: Diagnosis not present

## 2018-06-07 DIAGNOSIS — G43709 Chronic migraine without aura, not intractable, without status migrainosus: Secondary | ICD-10-CM | POA: Diagnosis not present

## 2018-06-27 ENCOUNTER — Telehealth: Payer: Self-pay

## 2018-06-27 MED ORDER — OSELTAMIVIR PHOSPHATE 75 MG PO CAPS: 75.0000 mg | ORAL_CAPSULE | Freq: Every day | ORAL | 0 refills | Status: DC

## 2018-06-27 NOTE — Telephone Encounter (Signed)
Copied from CRM [id number]. Topic: General - Other >> Jun 27, 2018 12:28 PM Mcneil, Ja-Kwan wrote: Reason for CRM: Pt stated her son tested positive for the flu and she was advised to contact pcp for a Rx for a preventative medication. Pt requests Rx to be sent to [facility] in [place]. Cb# [phone]

## 2018-06-27 NOTE — Telephone Encounter (Signed)
Please find out if she is having any flu symptoms. If she is that changes how she would take the tamiflu. She should understand that there are benefits to taking the tamiflu such as preventing her from getting the flu, though she could still get the flu. There are also risks such as confusion, delirium, and hallucinations. If those occur she should discontinue the tamiflu and be evaluated. Once you find out if she has symptoms I will send the tamiflu in. Thanks.

## 2018-06-27 NOTE — Telephone Encounter (Signed)
Sent to pharmacy.  Please also confirm whether or not she is breast-feeding.  Thanks.

## 2018-06-27 NOTE — Addendum Note (Signed)
Addended by: Glori Luis on: 06/27/2018 03:18 PM   Modules accepted: Orders

## 2018-07-22 ENCOUNTER — Encounter: Payer: Self-pay | Admitting: Family Medicine

## 2018-07-24 MED ORDER — CLOTRIMAZOLE 1 % EX CREA: 1.0000 "application " | TOPICAL_CREAM | Freq: Two times a day (BID) | CUTANEOUS | 0 refills | Status: DC

## 2018-07-24 NOTE — Telephone Encounter (Signed)
Sent to PCP to send in RX

## 2018-08-01 ENCOUNTER — Other Ambulatory Visit: Payer: Self-pay | Admitting: Obstetrics and Gynecology

## 2018-08-01 MED ORDER — FLUCONAZOLE 150 MG PO TABS: 150.0000 mg | ORAL_TABLET | Freq: Once | ORAL | 0 refills | Status: AC

## 2018-09-06 ENCOUNTER — Other Ambulatory Visit: Payer: Self-pay

## 2018-09-06 ENCOUNTER — Ambulatory Visit (INDEPENDENT_AMBULATORY_CARE_PROVIDER_SITE_OTHER): Payer: BLUE CROSS/BLUE SHIELD | Admitting: Maternal Newborn

## 2018-09-06 ENCOUNTER — Encounter: Payer: Self-pay | Admitting: Maternal Newborn

## 2018-09-06 DIAGNOSIS — R110 Nausea: Secondary | ICD-10-CM

## 2018-09-06 DIAGNOSIS — O26899 Other specified pregnancy related conditions, unspecified trimester: Secondary | ICD-10-CM

## 2018-09-06 DIAGNOSIS — O0990 Supervision of high risk pregnancy, unspecified, unspecified trimester: Secondary | ICD-10-CM

## 2018-09-06 DIAGNOSIS — Z8632 Personal history of gestational diabetes: Secondary | ICD-10-CM

## 2018-09-06 DIAGNOSIS — O26891 Other specified pregnancy related conditions, first trimester: Secondary | ICD-10-CM

## 2018-09-06 DIAGNOSIS — Z6835 Body mass index (BMI) 35.0-35.9, adult: Secondary | ICD-10-CM

## 2018-09-06 DIAGNOSIS — O09899 Supervision of other high risk pregnancies, unspecified trimester: Secondary | ICD-10-CM

## 2018-09-06 DIAGNOSIS — Z369 Encounter for antenatal screening, unspecified: Secondary | ICD-10-CM

## 2018-09-06 DIAGNOSIS — O09291 Supervision of pregnancy with other poor reproductive or obstetric history, first trimester: Secondary | ICD-10-CM

## 2018-09-06 MED ORDER — DOXYLAMINE-PYRIDOXINE ER 20-20 MG PO TBCR: 1.0000 | EXTENDED_RELEASE_TABLET | Freq: Every day | ORAL | 2 refills | Status: DC

## 2018-09-06 NOTE — Progress Notes (Signed)
NOB- concerned she did not wait two yrs to get pregnant

## 2018-09-06 NOTE — Progress Notes (Signed)
09/06/2018  Virtual Visit via Telephone Note  I connected with Angela Mccormick on 09/06/18 at  3:52 PM EDT by telephone and verified that I am speaking with the correct person using two identifiers.   I discussed the limitations, risks, security and privacy concerns of performing an evaluation and management service by telephone and the availability of in person appointments. I also discussed with the patient that there may be a patient responsible charge related to this service. The patient expressed understanding and agreed to proceed.   I discussed the assessment and treatment plan with the patient. The patient was provided an opportunity to ask questions and all were answered. The patient agreed with the plan and demonstrated an understanding of the instructions.   The patient was advised to call back or seek an in-person evaluation if the symptoms worsen or if the condition fails to improve as anticipated.  I provided 27 minutes of non-face-to-face time during this encounter.   Angela Mccormick, CNM   NOB Telephone Visit  Chief Complaint: Desires prenatal care.  Transfer of Care Patient: no  History of Present Illness: Angela Mccormick is a 33 y.o. G2P0101 at [redacted]w[redacted]d based on Patient's last menstrual period on 08/03/2018 (exact date), with an Estimated Date of Delivery: 05/10/19, with the above CC.   Her periods were: regular periods every month, lasting 4 days. Her last period was lighter than usual and lasted for 2 days. She was using oral progesterone-only contraceptive when she conceived.  She has Positive signs or symptoms of nausea/vomiting of pregnancy. She has Negative signs or symptoms of miscarriage or preterm labor She identifies Negative Zika risk factors for her and her partner On any different medications around the time she conceived/early pregnancy: No  History of varicella: Yes    Review of Systems  Constitutional: Negative.   HENT: Negative.   Eyes: Negative.    Respiratory: Negative for shortness of breath.        Has asthma, usually well-controlled  Cardiovascular: Negative for chest pain and palpitations.  Gastrointestinal: Positive for nausea. Negative for abdominal pain.  Genitourinary: Negative.   Musculoskeletal: Negative.   Skin: Negative.   Neurological: Positive for headaches.       Migraines  Endo/Heme/Allergies: Negative.   Psychiatric/Behavioral: Negative.    Review of systems was otherwise negative, except as stated in the above HPI.  OBGYN History: As per HPI. OB History  Gravida Para Term Preterm AB Living  2 1 0 1   1  SAB TAB Ectopic Multiple Live Births        0 1    # Outcome Date GA Lbr Len/2nd Weight Sex Delivery Anes PTL Lv  2 Current           1 Preterm 12/17/17 [redacted]w[redacted]d / 02:10 5 lb 13.8 oz (2.66 kg) M CS-LTranv Spinal  LIV    Any issues with any prior pregnancies: yes, insulin controlled gestational diabetes and PPROM with preterm delivery with G1, cesarean for fetal intolerance of labor. Difficult wound healing with stitch abscess postoperatively. Any prior children are healthy, doing well, without any problems or issues: yes History of pap smears: Yes. Last pap smear 06/19/2017. NILM/HPV Negative History of STIs: No   Past Medical History: Past Medical History:  Diagnosis Date  . Asthma   . Depression   . Gestational diabetes   . History of Chiari malformation   . History of syncope   . Migraine     Past Surgical History: Past  Surgical History:  Procedure Laterality Date  . CESAREAN SECTION N/A 12/17/2017   Procedure: CESAREAN SECTION;  Surgeon: [name], Andreas, MD;  Location: [facility] ORS;  Service: Obstetrics;  Laterality: N/A;  . FLEXIBLE SIGMOIDOSCOPY N/A 03/12/2018   Procedure: FLEXIBLE SIGMOIDOSCOPY;  Surgeon: Toney ReilVanga, Rohini Reddy, MD;  [facility];  Service: Gastroenterology;  Laterality: N/A;  . NO PAST SURGERIES    . WISDOM TOOTH EXTRACTION      Family History:  Family History   Problem Relation Age of Onset  . Colon cancer Mother   . Heart disease Mother   . Diabetes Mother   . Stroke Mother   . Diabetes Father    She denies any female cancers, bleeding or blood clotting disorders.  She denies any history of intellectual disability, birth defects or genetic disorders in her or the FOB's history  Social History:  Social History   Socioeconomic History  . Marital status: Married    Spouse name: Not on file  . Number of children: Not on file  . Years of education: Not on file  . Highest education level: Not on file  Occupational History  . Not on file  Social Needs  . Financial resource strain: Not on file  . Food insecurity:    Worry: Not on file    Inability: Not on file  . Transportation needs:    Medical: Not on file    Non-medical: Not on file  Tobacco Use  . Smoking status: Never Smoker  . Smokeless tobacco: Never Used  Substance and Sexual Activity  . Alcohol use: No    Frequency: Never  . Drug use: No  . Sexual activity: Yes    Partners: Male    Birth control/protection: None  Lifestyle  . Physical activity:    Days per week: Not on file    Minutes per session: Not on file  . Stress: Not on file  Relationships  . Social connections:    Talks on phone: Not on file    Gets together: Not on file    Attends religious service: Not on file    Active member of club or organization: Not on file    Attends meetings of clubs or organizations: Not on file    Relationship status: Not on file  . Intimate partner violence:    Fear of current or ex partner: Not on file    Emotionally abused: Not on file    Physically abused: Not on file    Forced sexual activity: Not on file  Other Topics Concern  . Not on file  Social History Narrative  . Not on file   Any cats in the household: yes, outdoor, aware to avoid cat feces and litter due to risk of toxoplasmosis.  Domestic violence screening is negative.  Allergy: Allergies  Allergen  Reactions  . Sumatriptan Succinate Anxiety  . Other     dissolvable staples - body rejects    Current Outpatient Medications:  Current Outpatient Medications:  .  albuterol (PROVENTIL HFA;VENTOLIN HFA) 108 (90 Base) MCG/ACT inhaler, Inhale 2 puffs into the lungs every 6 (six) hours as needed for wheezing or shortness of breath., Disp: 18 g, Rfl: 1 .  clotrimazole (LOTRIMIN) 1 % cream, Apply 1 application topically 2 (two) times daily., Disp: 30 g, Rfl: 0 .  prenatal vitamin w/FE, FA (NATACHEW) 29-1 MG CHEW chewable tablet, Chew 1 tablet by mouth daily at 12 noon., Disp: , Rfl:    Physical Exam:  LMP 08/03/2018 (Exact Date)  Physical exam not done due to telephone visit.  Assessment: Ms. Vanallen is a 33 y.o. G2P0101 at [redacted]w[redacted]d based on Patient's last menstrual period on 08/03/2018 (exact date), with an Estimated Date of Delivery: 05/10/2019, presenting for prenatal care.  Plan:  1) Avoid alcoholic beverages. 2) Patient encouraged not to smoke.  3) Discontinue the use of all non-medicinal drugs and chemicals.  4) Take prenatal vitamins daily.  5) Seatbelt use advised 6) Nutrition, food safety (fish, cheese advisories, and high nitrite foods) and exercise discussed. 7) Hospital and practice style delivering at [facility] discussed  8) Patient is asked about travel to areas at risk for the Zika virus, and counseled to avoid travel and exposure to mosquitoes or partners who may have themselves been exposed to the virus.  9) Genetic Screening, such as with 1st Trimester Screening, cell free fetal DNA, AFP testing, and Ultrasound, is discussed with patient. She plans to decline genetic testing this pregnancy. 10) NOB labs with GTT ordered. 11) Dating scan ordered. 12) Rx sent for [facility], this helped her nausea during her last pregnancy. 13) Desires repeat Cesarean for this delivery.  Problem list reviewed and updated.  Marcelyn Bruins, CNM

## 2018-09-06 NOTE — Patient Instructions (Signed)
Hello,  Given the current COVID-19 pandemic, our practice is making changes in how we are providing care to our patients. We are limiting in-person visits for the safety of all of our patients.   As a practice, we have met to discuss the best way to minimize visits, but still provide excellent care to our expecting mothers.  We have decided on the following visit structure for low-risk pregnancies.  Initial Pregnancy visit will be conducted as a telephone or web visit.  Between 10-14 weeks  there will be one in-person visit for an ultrasound, lab work, and genetic screening. 20 weeks in-person visit with an anatomy ultrasound  28 weeks in-person office visit for a 1-hour glucose test and a TDAP vaccination 32 weeks in-person office visit 34 weeks telephone visit 36 weeks in-person office visit for GBS, chlamydia, and gonorrhea testing 38 weeks in-person office visit 40 weeks in-person office visit  Understandably, some patients will require more visits than what is outlined above. Additional visits will be determined on a case-by-case basis.   We will, as always, be available for emergencies or to address concerns that might arise between in-person visits. We ask that you allow [place] the opportunity to address any concerns over the phone or through a virtual visit first. We will be available to return your phone calls throughout the day.   If you are able to purchase a scale, a blood pressure machine, and a home fetal doppler visits could be limited further. This will help decrease your exposure risks, but these purchases are not a necessity.   Things seem to change daily and there is the possibility that this structure could change, please be patient as we adapt to a new way of caring for patients.   Thank you for trusting [place] with your prenatal care. Our practice values you and looks forward to providing you with excellent care.   Sincerely,   [facility], [place]     COVID-19 and Your Pregnancy FAQ  How can I prevent infection with COVID-19 during my pregnancy? Social distancing is key. Please limit any interactions in public. Try and work from home if possible. Frequently wash your hands after touching possibly contaminated surfaces. Avoid touching your face.  Minimize trips to the store. Consider online ordering when possible.   Should I wear a mask? YES. It is recommended by the CDC that all people wear a cloth mask or facial covering in public. This will help reduce transmission as well as your risk or acquiring COVID-19. New studies are showing that even asymptomatic individuals can spread the virus from talking.   What are the symptoms of COVID-19? Fever (greater than 100.4 F), dry cough, shortness of breath.  Am I more at risk for COVID-19 since I am pregnant? There is not currently data showing that pregnant women are more adversely impacted by COVID-19 than the general population. However, we know that pregnant women tend to have worse respiratory complications from similar diseases such as the flu and SARS and for this reason should be considered an at-risk population.  What do I do if I am experiencing the symptoms of COVID-19? Testing is being limited because of test availability. If you are experiencing symptoms you should quarantine yourself, and the members of your family, for at least 2 weeks at home.   Please visit this website for more information: [email or web address]  When should I go to the Emergency Room? Please go to the emergency room if you  are experiencing ANY of these symptoms*:  1.    Difficulty breathing or shortness of breath 2.    Persistent pain or pressure in the chest 3.    Confusion or difficulty being aroused (or awakened) 4.    Bluish lips or face  *This list is not all inclusive. Please consult our office for any other symptoms that are severe or  concerning.  What do I do if I am having difficulty breathing? You should go to the Emergency Room for evaluation. At this time they have a tent set up for evaluating patients with COVID-19 symptoms.   How will my prenatal care be different because of the COVID-19 pandemic? It has been recommended to reduce the frequency of face-to-face visits and use resources such as telephone and virtual visits when possible. Using a scale, blood pressure machine and fetal doppler at home can further help reduce face-to-face visits. You will be provided with additional information on this topic.  We ask that you come to your visits alone to minimize potential exposures to  COVID-19.  How can I receive childbirth education? At this time in-person classes have been cancelled. You can register for online childbirth education, breastfeeding, and newborn care classes.  Please visit:  [email or web address] for more information  How will my hospital birth experience be different? The hospital is currently limiting visitors. This means that while you are in labor you can only have one person at the hospital with you. Additional family members will not be allowed to wait in the building or outside your room. Your one support person can be the father of the baby, a relative, a doula, or a friend. Once one support person is designated that person will wear a band. This band cannot be shared with multiple people.  Nitrous Gas is not being offered for pain relief since the tubing and filter for the machine can not be sanitized in a way to guarantee prevention of transmission of COVID-19.  Nasal cannula use of oxygen for fetal indications has also been discontinued.  Currently a clear plastic sheet is being hung between mom and the delivering provider during pushing and delivery to help prevent transmission of COVID-19.      How long will I stay in the hospital for after giving birth? It is also recommended that  discharge home be expedited during the COVID-19 outbreak. This means staying for 1 day after a vaginal delivery and 2 days after a cesarean section. Patients who need to stay longer for medical reasons are allowed to do so, but the goal will be for expedited discharge home.   What if I have COVID-19 and I am in labor? We ask that you wear a mask while on labor and delivery. We will try and accommodate you being placed in a room that is capable of filtering the air. Please call ahead if you are in labor and on your way to the hospital. The phone number for labor and delivery at [facility] is [phone].  If I have COVID-19 when my baby is born how can I prevent my baby from contracting COVID-19? This is an issue that will have to be discussed on a case-by-case basis. Current recommendations suggest providing separate isolation rooms for both the mother and new infant as well as limiting visitors. However, there are practical challenges to this recommendation. The situation will assuredly change and decisions will be influenced by the desires of the mother and availability of space.    Some suggestions are the use of a curtain or physical barrier between mom and infant, hand hygiene, mom wearing a mask, or 6 feet of spacing between a mom and infant.   Can I breastfeed during the COVID-19 pandemic?   Yes, breastfeeding is encouraged.  Can I breastfeed if I have COVID-19? Yes. Covid-19 has not been found in breast milk. This means you cannot give COVID-19 to your child through breast milk. Breast feeding will also help pass antibodies to fight infection to your baby.   What precautions should I take when breastfeeding if I have COVID-19? If a mother and newborn do room-in and the mother wishes to feed at the breast, she should put on a facemask and practice hand hygiene before each feeding.  What precautions should I take when pumping if I have COVID-19? Prior to expressing  breast milk, mothers should practice hand hygiene. After each pumping session, all parts that come into contact with breast milk should be thoroughly washed and the entire pump should be appropriately disinfected per the manufacturer's instructions. This expressed breast milk should be fed to the newborn by a healthy caregiver.  What if I am pregnant and work in healthcare? Based on limited data regarding COVID-19 and pregnancy, ACOG currently does not propose creating additional restrictions on pregnant health care personnel because of COVID-19 alone. Pregnant women do not appear to be at higher risk of severe disease related to COVID-19. Pregnant health care personnel should follow CDC risk assessment and infection control guidelines for health care personnel exposed to patients with suspected or confirmed COVID-19. Adherence to recommended infection prevention and control practices is an important part of protecting all health care personnel in health care settings.    Information on COVID-19 in pregnancy is very limited; however, facilities may want to consider limiting exposure of pregnant health care personnel to patients with confirmed or suspected COVID-19 infection, especially during higher-risk procedures (eg, aerosol-generating procedures), if feasible, based on staffing availability.

## 2018-09-11 DIAGNOSIS — O09899 Supervision of other high risk pregnancies, unspecified trimester: Secondary | ICD-10-CM | POA: Insufficient documentation

## 2018-10-09 ENCOUNTER — Other Ambulatory Visit: Payer: Self-pay

## 2018-10-09 ENCOUNTER — Other Ambulatory Visit: Payer: BLUE CROSS/BLUE SHIELD

## 2018-10-09 ENCOUNTER — Encounter: Payer: Self-pay | Admitting: Maternal Newborn

## 2018-10-09 ENCOUNTER — Ambulatory Visit (INDEPENDENT_AMBULATORY_CARE_PROVIDER_SITE_OTHER): Payer: BLUE CROSS/BLUE SHIELD | Admitting: Maternal Newborn

## 2018-10-09 ENCOUNTER — Ambulatory Visit (INDEPENDENT_AMBULATORY_CARE_PROVIDER_SITE_OTHER): Payer: BLUE CROSS/BLUE SHIELD

## 2018-10-09 VITALS — BP 112/70 | Wt 199.2 lb

## 2018-10-09 DIAGNOSIS — N8312 Corpus luteum cyst of left ovary: Secondary | ICD-10-CM

## 2018-10-09 DIAGNOSIS — Z8632 Personal history of gestational diabetes: Secondary | ICD-10-CM | POA: Diagnosis not present

## 2018-10-09 DIAGNOSIS — Z6835 Body mass index (BMI) 35.0-35.9, adult: Secondary | ICD-10-CM

## 2018-10-09 DIAGNOSIS — O3481 Maternal care for other abnormalities of pelvic organs, first trimester: Secondary | ICD-10-CM | POA: Diagnosis not present

## 2018-10-09 DIAGNOSIS — Z3A09 9 weeks gestation of pregnancy: Secondary | ICD-10-CM

## 2018-10-09 DIAGNOSIS — O0991 Supervision of high risk pregnancy, unspecified, first trimester: Secondary | ICD-10-CM

## 2018-10-09 DIAGNOSIS — O0990 Supervision of high risk pregnancy, unspecified, unspecified trimester: Secondary | ICD-10-CM | POA: Diagnosis not present

## 2018-10-09 DIAGNOSIS — Z3A10 10 weeks gestation of pregnancy: Secondary | ICD-10-CM | POA: Diagnosis not present

## 2018-10-09 DIAGNOSIS — Z369 Encounter for antenatal screening, unspecified: Secondary | ICD-10-CM

## 2018-10-09 LAB — POCT URINALYSIS DIPSTICK OB
Glucose, UA: NEGATIVE
POC,PROTEIN,UA: NEGATIVE

## 2018-10-09 NOTE — Progress Notes (Addendum)
Routine Prenatal Care Visit  Subjective  Angela Mccormick is a 33 Mccormick.o. G2P0101 at 5526w4d being seen today for ongoing prenatal care.  She is currently monitored for the following issues for this high-risk pregnancy and has Chiari malformation (HCC); Migraines; Syncope; Nodule of neck; Supervision of high risk pregnancy, antepartum; Obesity affecting pregnancy, antepartum; Gestational diabetes mellitus (GDM); Encounter for general adult medical examination with abnormal findings; Rectal bleeding; Otitis media; and Short interval between pregnancies affecting pregnancy, antepartum on their problem list.  ----------------------------------------------------------------------------------- Patient reports nausea.  Has vomited twice. She has significant family stress as her husband has lost multiple family members to traumatic events in the past months and has PTSD. Vag. Bleeding: None.   ----------------------------------------------------------------------------------- The following portions of the patient's history were reviewed and updated as appropriate: allergies, current medications, past family history, past medical history, past social history, past surgical history and problem list. Problem list updated.  Objective  Blood pressure 112/70, weight 199 lb 3.2 oz (90.4 kg), last menstrual period 08/03/2018, currently breastfeeding. Pregravid weight 188 lb (85.3 kg) Total Weight Gain 11 lb 3.2 oz (5.08 kg) Urinalysis: Urine dipstick shows negative for glucose, protein. Fetal Status: Fetal Heart Rate (bpm): 170         General:  Alert, oriented and cooperative. Patient is in no acute distress.  Skin: Skin is warm and dry. No rash noted.   Cardiovascular: Normal heart rate noted  Respiratory: Normal respiratory effort, no problems with respiration noted  Abdomen: Soft, gravid, appropriate for gestational age.       Pelvic:  Cervical exam deferred        Extremities: Normal range of motion.      Mental Status: Normal mood and affect. Normal behavior. Normal judgment and thought content.     Assessment   33 Mccormick.o. G2P0101 at 3926w4d, EDD 05/10/2019 by Last Menstrual Period presenting for a routine prenatal visit.  Plan   SECOND Problems (from 09/06/18 to present)    Problem Noted Resolved   Short interval between pregnancies affecting pregnancy, antepartum 09/11/2018 by Angela Mccormick, Angela Mccormick, CNM No   Supervision of high risk pregnancy, antepartum 06/19/2017 by Angela Mccormick, Andreas, MD No   Overview Addendum 09/11/2018 12:00 PM by Angela Mccormick, Angela Mccormick, CNM    [facility] Prenatal Labs  Dating  Blood type: --/--/A POS (08/03 1808)   Genetic Screen 1 Screen:    AFP:     Quad:     NIPS: Antibody:NEG (08/03 1808)  Anatomic US  Rubella:   Varicella:    GTT Early:               Third trimester:  RPR: Non Reactive (08/03 1808)   Rhogam  HBsAg:     TDaP vaccine                       Flu Shot: HIV: Non Reactive (06/26 1052)   Baby Food                                GBS:   Contraception  Pap: 06/19/2017, NILM and HPV Negative  CBB     CS/VBAC    Support Person               Obesity affecting pregnancy, antepartum 06/19/2017 by Angela Mccormick, Andreas, MD No    Ultrasound today shows singleton IUP at 6487w2d; this is 5 days different than LMP  dating, thus the EDD will remain 05/10/2019 per ACOG Guidelines.  Early GTT and labs today.  Coping with stress and feels that her husband is beginning to get excited about the pregnancy.  May increase Bonjesta to one tablet at bedtime and one during the daytime.  Please refer to After Visit Summary for other counseling recommendations.   Return in about 4 weeks (around 11/06/2018) for ROB.  Angela Mccormick, CNM 10/09/2018  9:35 AM

## 2018-10-09 NOTE — Patient Instructions (Signed)
First Trimester of Pregnancy  The first trimester of pregnancy is from week 1 until the end of week 13 (months 1 through 3). A week after a sperm fertilizes an egg, the egg will implant on the wall of the uterus. This embryo will begin to develop into a baby. Genes from you and your partner will form the baby. The female genes will determine whether the baby will be a boy or a girl. At 6-8 weeks, the eyes and face will be formed, and the heartbeat can be seen on ultrasound. At the end of 12 weeks, all the baby's organs will be formed.  Now that you are pregnant, you will want to do everything you can to have a healthy baby. Two of the most important things are to get good prenatal care and to follow your health care provider's instructions. Prenatal care is all the medical care you receive before the baby's birth. This care will help prevent, find, and treat any problems during the pregnancy and childbirth.  Body changes during your first trimester  Your body goes through many changes during pregnancy. The changes vary from woman to woman.   You may gain or lose a couple of pounds at first.   You may feel sick to your stomach (nauseous) and you may throw up (vomit). If the vomiting is uncontrollable, call your health care provider.   You may tire easily.   You may develop headaches that can be relieved by medicines. All medicines should be approved by your health care provider.   You may urinate more often. Painful urination may mean you have a bladder infection.   You may develop heartburn as a result of your pregnancy.   You may develop constipation because certain hormones are causing the muscles that push stool through your intestines to slow down.   You may develop hemorrhoids or swollen veins (varicose veins).   Your breasts may begin to grow larger and become tender. Your nipples may stick out more, and the tissue that surrounds them (areola) may become darker.   Your gums may bleed and may be  sensitive to brushing and flossing.   Dark spots or blotches (chloasma, mask of pregnancy) may develop on your face. This will likely fade after the baby is born.   Your menstrual periods will stop.   You may have a loss of appetite.   You may develop cravings for certain kinds of food.   You may have changes in your emotions from day to day, such as being excited to be pregnant or being concerned that something may go wrong with the pregnancy and baby.   You may have more vivid and strange dreams.   You may have changes in your hair. These can include thickening of your hair, rapid growth, and changes in texture. Some women also have hair loss during or after pregnancy, or hair that feels dry or thin. Your hair will most likely return to normal after your baby is born.  What to expect at prenatal visits  During a routine prenatal visit:   You will be weighed to make sure you and the baby are growing normally.   Your blood pressure will be taken.   Your abdomen will be measured to track your baby's growth.   The fetal heartbeat will be listened to between weeks 10 and 14 of your pregnancy.   Test results from any previous visits will be discussed.  Your health care provider may ask you:     How you are feeling.   If you are feeling the baby move.   If you have had any abnormal symptoms, such as leaking fluid, bleeding, severe headaches, or abdominal cramping.   If you are using any tobacco products, including cigarettes, chewing tobacco, and electronic cigarettes.   If you have any questions.  Other tests that may be performed during your first trimester include:   Blood tests to find your blood type and to check for the presence of any previous infections. The tests will also be used to check for low iron levels (anemia) and protein on red blood cells (Rh antibodies). Depending on your risk factors, or if you previously had diabetes during pregnancy, you may have tests to check for high blood sugar  that affects pregnant women (gestational diabetes).   Urine tests to check for infections, diabetes, or protein in the urine.   An ultrasound to confirm the proper growth and development of the baby.   Fetal screens for spinal cord problems (spina bifida) and Down syndrome.   HIV (human immunodeficiency virus) testing. Routine prenatal testing includes screening for HIV, unless you choose not to have this test.   You may need other tests to make sure you and the baby are doing well.  Follow these instructions at home:  Medicines   Follow your health care provider's instructions regarding medicine use. Specific medicines may be either safe or unsafe to take during pregnancy.   Take a prenatal vitamin that contains at least 600 micrograms (mcg) of folic acid.   If you develop constipation, try taking a stool softener if your health care provider approves.  Eating and drinking     Eat a balanced diet that includes fresh fruits and vegetables, whole grains, good sources of protein such as meat, eggs, or tofu, and low-fat dairy. Your health care provider will help you determine the amount of weight gain that is right for you.   Avoid raw meat and uncooked cheese. These carry germs that can cause birth defects in the baby.   Eating four or five small meals rather than three large meals a day may help relieve nausea and vomiting. If you start to feel nauseous, eating a few soda crackers can be helpful. Drinking liquids between meals, instead of during meals, also seems to help ease nausea and vomiting.   Limit foods that are high in fat and processed sugars, such as fried and sweet foods.   To prevent constipation:  ? Eat foods that are high in fiber, such as fresh fruits and vegetables, whole grains, and beans.  ? Drink enough fluid to keep your urine clear or pale yellow.  Activity   Exercise only as directed by your health care provider. Most women can continue their usual exercise routine during  pregnancy. Try to exercise for 30 minutes at least 5 days a week. Exercising will help you:  ? Control your weight.  ? Stay in shape.  ? Be prepared for labor and delivery.   Experiencing pain or cramping in the lower abdomen or lower back is a good sign that you should stop exercising. Check with your health care provider before continuing with normal exercises.   Try to avoid standing for long periods of time. Move your legs often if you must stand in one place for a long time.   Avoid heavy lifting.   Wear low-heeled shoes and practice good posture.   You may continue to have sex unless your health care   provider tells you not to.  Relieving pain and discomfort   Wear a good support bra to relieve breast tenderness.   Take warm sitz baths to soothe any pain or discomfort caused by hemorrhoids. Use hemorrhoid cream if your health care provider approves.   Rest with your legs elevated if you have leg cramps or low back pain.   If you develop varicose veins in your legs, wear support hose. Elevate your feet for 15 minutes, 3-4 times a day. Limit salt in your diet.  Prenatal care   Schedule your prenatal visits by the twelfth week of pregnancy. They are usually scheduled monthly at first, then more often in the last 2 months before delivery.   Write down your questions. Take them to your prenatal visits.   Keep all your prenatal visits as told by your health care provider. This is important.  Safety   Wear your seat belt at all times when driving.   Make a list of emergency phone numbers, including numbers for family, friends, the hospital, and police and fire departments.  General instructions   Ask your health care provider for a referral to a local prenatal education class. Begin classes no later than the beginning of month 6 of your pregnancy.   Ask for help if you have counseling or nutritional needs during pregnancy. Your health care provider can offer advice or refer you to specialists for help  with various needs.   Do not use hot tubs, steam rooms, or saunas.   Do not douche or use tampons or scented sanitary pads.   Do not cross your legs for long periods of time.   Avoid cat litter boxes and soil used by cats. These carry germs that can cause birth defects in the baby and possibly loss of the fetus by miscarriage or stillbirth.   Avoid all smoking, herbs, alcohol, and medicines not prescribed by your health care provider. Chemicals in these products affect the formation and growth of the baby.   Do not use any products that contain nicotine or tobacco, such as cigarettes and e-cigarettes. If you need help quitting, ask your health care provider. You may receive counseling support and other resources to help you quit.   Schedule a dentist appointment. At home, brush your teeth with a soft toothbrush and be gentle when you floss.  Contact a health care provider if:   You have dizziness.   You have mild pelvic cramps, pelvic pressure, or nagging pain in the abdominal area.   You have persistent nausea, vomiting, or diarrhea.   You have a bad smelling vaginal discharge.   You have pain when you urinate.   You notice increased swelling in your face, hands, legs, or ankles.   You are exposed to fifth disease or chickenpox.   You are exposed to German measles (rubella) and have never had it.  Get help right away if:   You have a fever.   You are leaking fluid from your vagina.   You have spotting or bleeding from your vagina.   You have severe abdominal cramping or pain.   You have rapid weight gain or loss.   You vomit blood or material that looks like coffee grounds.   You develop a severe headache.   You have shortness of breath.   You have any kind of trauma, such as from a fall or a car accident.  Summary   The first trimester of pregnancy is from week 1 until   the end of week 13 (months 1 through 3).   Your body goes through many changes during pregnancy. The changes vary from  woman to woman.   You will have routine prenatal visits. During those visits, your health care provider will examine you, discuss any test results you may have, and talk with you about how you are feeling.  This information is not intended to replace advice given to you by your health care provider. Make sure you discuss any questions you have with your health care provider.  Document Released: 04/25/2001 Document Revised: 04/12/2016 Document Reviewed: 04/12/2016  Elsevier Interactive Patient Education  2019 [employer]

## 2018-10-09 NOTE — Progress Notes (Signed)
ROB/1 hr GTT- no concerns 

## 2018-10-10 LAB — RPR+RH+ABO+RUB AB+AB SCR+CB...
Antibody Screen: NEGATIVE
HIV Screen 4th Generation wRfx: NONREACTIVE
Hematocrit: 38.3 % (ref 34.0–46.6)
Hemoglobin: 12.6 g/dL (ref 11.1–15.9)
Hepatitis B Surface Ag: NEGATIVE
MCH: 28.8 pg (ref 26.6–33.0)
MCHC: 32.9 g/dL (ref 31.5–35.7)
MCV: 88 fL (ref 79–97)
Platelets: 262 10*3/uL (ref 150–450)
RBC: 4.37 x10E6/uL (ref 3.77–5.28)
RDW: 13.4 % (ref 11.7–15.4)
RPR Ser Ql: NONREACTIVE
Rh Factor: POSITIVE
Rubella Antibodies, IGG: 1.56 index (ref >0.99)
Varicella zoster IgG: 541 index (ref >165)
WBC: 6.2 10*3/uL (ref 3.4–10.8)

## 2018-10-10 LAB — GLUCOSE, 1 HOUR GESTATIONAL: Gestational Diabetes Screen: 117 mg/dL (ref 65–139)

## 2018-11-06 ENCOUNTER — Other Ambulatory Visit: Payer: Self-pay

## 2018-11-06 ENCOUNTER — Ambulatory Visit (INDEPENDENT_AMBULATORY_CARE_PROVIDER_SITE_OTHER): Payer: BC Managed Care – PPO | Admitting: Advanced Practice Midwife

## 2018-11-06 ENCOUNTER — Encounter: Payer: Self-pay | Admitting: Advanced Practice Midwife

## 2018-11-06 ENCOUNTER — Encounter: Payer: BLUE CROSS/BLUE SHIELD | Admitting: Maternal Newborn

## 2018-11-06 VITALS — BP 118/60 | Wt 200.0 lb

## 2018-11-06 DIAGNOSIS — O09211 Supervision of pregnancy with history of pre-term labor, first trimester: Secondary | ICD-10-CM

## 2018-11-06 DIAGNOSIS — O0991 Supervision of high risk pregnancy, unspecified, first trimester: Secondary | ICD-10-CM

## 2018-11-06 DIAGNOSIS — Z3A13 13 weeks gestation of pregnancy: Secondary | ICD-10-CM

## 2018-11-06 DIAGNOSIS — O09899 Supervision of other high risk pregnancies, unspecified trimester: Secondary | ICD-10-CM | POA: Insufficient documentation

## 2018-11-06 DIAGNOSIS — O0990 Supervision of high risk pregnancy, unspecified, unspecified trimester: Secondary | ICD-10-CM

## 2018-11-06 LAB — POCT URINALYSIS DIPSTICK OB
Glucose, UA: NEGATIVE
POC,PROTEIN,UA: NEGATIVE

## 2018-11-06 MED ORDER — HYDROXYPROGESTERONE CAPROATE 250 MG/ML IM OIL: 250.0000 mg | TOPICAL_OIL | Freq: Once | INTRAMUSCULAR | 20 refills | Status: DC

## 2018-11-06 NOTE — Progress Notes (Signed)
No complaints

## 2018-11-06 NOTE — Progress Notes (Addendum)
Routine Prenatal Care Visit  Subjective  Angela Mccormick is a 33 y.o. G2P0101 at 691w4d being seen today for ongoing prenatal care.  She is currently monitored for the following issues for this high-risk pregnancy and has Chiari malformation (HCC); Migraines; Syncope; Nodule of neck; Supervision of high risk pregnancy, antepartum; Obesity affecting pregnancy, antepartum; Gestational diabetes mellitus (GDM); Encounter for general adult medical examination with abnormal findings; Rectal bleeding; Otitis media; Short interval between pregnancies affecting pregnancy, antepartum; and History of preterm delivery, currently pregnant on their problem list.  ----------------------------------------------------------------------------------- Patient reports no complaints.  Discussed 17 OHP injections- patient is interested in having them. Will start in 3 weeks with next visit. Ordered today. She states stress level is still high at home but she is coping ok.  . Vag. Bleeding: None.   . Denies leaking of fluid.  ----------------------------------------------------------------------------------- The following portions of the patient's history were reviewed and updated as appropriate: allergies, current medications, past family history, past medical history, past social history, past surgical history and problem list. Problem list updated.   Objective  Blood pressure 118/60, weight 200 lb (90.7 kg), last menstrual period 08/03/2018, currently breastfeeding. Pregravid weight 188 lb (85.3 kg) Total Weight Gain 12 lb (5.443 kg) Urinalysis: Urine Protein Negative  Urine Glucose Negative  Fetal Status: Fetal Heart Rate (bpm): 150         General:  Alert, oriented and cooperative. Patient is in no acute distress.  Skin: Skin is warm and dry. No rash noted.   Cardiovascular: Normal heart rate noted  Respiratory: Normal respiratory effort, no problems with respiration noted  Abdomen: Soft, gravid, appropriate  for gestational age.       Pelvic:  Cervical exam deferred        Extremities: Normal range of motion.     Mental Status: Normal mood and affect. Normal behavior. Normal judgment and thought content.   Assessment   33 y.o. G2P0101 at 321w4d by  05/10/2019, by Last Menstrual Period presenting for routine prenatal visit  Plan   SECOND Problems (from 09/06/18 to present)    Problem Noted Resolved   History of preterm delivery, currently pregnant 11/06/2018 by Tresea MallGledhill, Deane Melick, CNM No   Short interval between pregnancies affecting pregnancy, antepartum 09/11/2018 by Oswaldo ConroySchmid, Jacelyn Y, CNM No   Supervision of high risk pregnancy, antepartum 06/19/2017 by [name], Andreas, MD No   Overview Addendum 10/09/2018  9:35 AM by Oswaldo ConroySchmid, Jacelyn Y, CNM    Clinic [facility] Prenatal Labs  Dating L=10 Blood type: --/--/A POS (08/03 1808)   Genetic Screen Declines Antibody:NEG (08/03 1808)  Anatomic US  Rubella:   Varicella:    GTT Early:               Third trimester:  RPR: Non Reactive (08/03 1808)   Rhogam  HBsAg:     TDaP vaccine                       Flu Shot: HIV: Non Reactive (06/26 1052)   Baby Food                                GBS:   Contraception  Pap: 06/19/2017, NILM and HPV Negative  CBB     CS/VBAC Preterm c/s 2019/wants repeat with AMS- don't use dissolvable staples   Support Person               Obesity  affecting pregnancy, antepartum 06/19/2017 by [name], MD No       Preterm labor symptoms and general obstetric precautions including but not limited to vaginal bleeding, contractions, leaking of fluid and fetal movement were reviewed in detail with the patient. Please refer to After Visit Summary for other counseling recommendations.  Rx 17 OHP sent to [facility] in [place]   Return in about 3 weeks (around 11/27/2018) for Saxis and rob.  Rod Can, CNM 11/06/2018 2:19 PM

## 2018-11-06 NOTE — Patient Instructions (Signed)

## 2018-11-25 ENCOUNTER — Telehealth: Payer: Self-pay | Admitting: Obstetrics and Gynecology

## 2018-11-25 NOTE — Telephone Encounter (Signed)
I called number given, was on hold for 9 minutes. I am in clinic and was needed to assist provider. Will call back

## 2018-11-25 NOTE — Telephone Encounter (Signed)
Joey from [facility] [employer] calling to verify quantity of the [place]. Please call back to clarify at [phone].

## 2018-11-26 NOTE — Telephone Encounter (Signed)
I spoke to [place] at [facility]) Order has been placed with auto refill.

## 2018-11-28 ENCOUNTER — Ambulatory Visit (INDEPENDENT_AMBULATORY_CARE_PROVIDER_SITE_OTHER): Payer: BC Managed Care – PPO | Admitting: Obstetrics and Gynecology

## 2018-11-28 ENCOUNTER — Other Ambulatory Visit: Payer: Self-pay

## 2018-11-28 VITALS — BP 124/74 | Wt 201.0 lb

## 2018-11-28 DIAGNOSIS — O09899 Supervision of other high risk pregnancies, unspecified trimester: Secondary | ICD-10-CM

## 2018-11-28 DIAGNOSIS — O09212 Supervision of pregnancy with history of pre-term labor, second trimester: Secondary | ICD-10-CM

## 2018-11-28 DIAGNOSIS — O09892 Supervision of other high risk pregnancies, second trimester: Secondary | ICD-10-CM

## 2018-11-28 DIAGNOSIS — Z3A16 16 weeks gestation of pregnancy: Secondary | ICD-10-CM

## 2018-11-28 DIAGNOSIS — O99210 Obesity complicating pregnancy, unspecified trimester: Secondary | ICD-10-CM

## 2018-11-28 DIAGNOSIS — O0990 Supervision of high risk pregnancy, unspecified, unspecified trimester: Secondary | ICD-10-CM

## 2018-11-28 DIAGNOSIS — O34219 Maternal care for unspecified type scar from previous cesarean delivery: Secondary | ICD-10-CM

## 2018-11-28 DIAGNOSIS — Z98891 History of uterine scar from previous surgery: Secondary | ICD-10-CM | POA: Insufficient documentation

## 2018-11-28 DIAGNOSIS — O99212 Obesity complicating pregnancy, second trimester: Secondary | ICD-10-CM

## 2018-11-28 DIAGNOSIS — Q0701 Arnold-Chiari syndrome with spina bifida: Secondary | ICD-10-CM

## 2018-11-28 DIAGNOSIS — O0992 Supervision of high risk pregnancy, unspecified, second trimester: Secondary | ICD-10-CM

## 2018-11-28 NOTE — Progress Notes (Signed)
ROB Makena to be shipped 7/17

## 2018-11-28 NOTE — Progress Notes (Signed)
Routine Prenatal Care Visit  Subjective  Angela Mccormick is a 33 y.o. G2P0101 at 8964w5d being seen today for ongoing prenatal care.  She is currently monitored for the following issues for this high-risk pregnancy and has Chiari malformation (HCC); Migraines; Syncope; Nodule of neck; Supervision of high risk pregnancy, antepartum; Obesity affecting pregnancy, antepartum; Gestational diabetes mellitus (GDM); Encounter for general adult medical examination with abnormal findings; Rectal bleeding; Otitis media; Short interval between pregnancies affecting pregnancy, antepartum; and History of preterm delivery, currently pregnant on their problem list.  ----------------------------------------------------------------------------------- Patient reports no complaints.   Contractions: Not present. Vag. Bleeding: None.  Movement: Present. Denies leaking of fluid.  ----------------------------------------------------------------------------------- The following portions of the patient's history were reviewed and updated as appropriate: allergies, current medications, past family history, past medical history, past social history, past surgical history and problem list. Problem list updated.   Objective  Blood pressure 124/74, weight 201 lb (91.2 kg), last menstrual period 08/03/2018, currently breastfeeding. Pregravid weight 188 lb (85.3 kg) Total Weight Gain 13 lb (5.897 kg) Urinalysis:      Fetal Status: Fetal Heart Rate (bpm): 155   Movement: Present     General:  Alert, oriented and cooperative. Patient is in no acute distress.  Skin: Skin is warm and dry. No rash noted.   Cardiovascular: Normal heart rate noted  Respiratory: Normal respiratory effort, no problems with respiration noted  Abdomen: Soft, gravid, appropriate for gestational age. Pain/Pressure: Present     Pelvic:  Cervical exam deferred        Extremities: Normal range of motion.     ental Status: Normal mood and affect.  Normal behavior. Normal judgment and thought content.     Assessment   33 y.o. G2P0101 at 6564w5d by  05/10/2019, by Last Menstrual Period presenting for routine prenatal visit  Plan   SECOND Problems (from 09/06/18 to present)    Problem Noted Resolved   Supervision of high risk pregnancy, antepartum 06/19/2017 by [name], Kire Ferg, MD No   Priority:  High     Overview Addendum 10/09/2018  9:35 AM by Oswaldo ConroySchmid, Jacelyn Y, CNM    Clinic [facility] Prenatal Labs  Dating L=10 Blood type: --/--/A POS (08/03 1808)   Genetic Screen Declines Antibody:NEG (08/03 1808)  Anatomic US  Rubella:   Varicella:    GTT Early:               Third trimester:  RPR: Non Reactive (08/03 1808)   Rhogam  HBsAg:     TDaP vaccine                       Flu Shot: HIV: Non Reactive (06/26 1052)   Baby Food                                GBS:   Contraception  Pap: 06/19/2017, NILM and HPV Negative  CBB     CS/VBAC    Support Person               History of preterm delivery, currently pregnant 11/06/2018 by Tresea MallGledhill, Jane, CNM No   Short interval between pregnancies affecting pregnancy, antepartum 09/11/2018 by Oswaldo ConroySchmid, Jacelyn Y, CNM No   Obesity affecting pregnancy, antepartum 06/19/2017 by [name], Gabryella Murfin, MD No       Gestational age appropriate obstetric precautions including but not limited to vaginal bleeding, contractions, leaking of fluid and fetal  movement were reviewed in detail with the patient.    Return in about 3 weeks (around 12/19/2018) for ROB and anatomy scan in 3 week, weekly nurse visits for Makena injections.  [name], MD, [facility], [facility] 11/28/2018, 11:12 AM

## 2018-12-04 DIAGNOSIS — O09219 Supervision of pregnancy with history of pre-term labor, unspecified trimester: Secondary | ICD-10-CM | POA: Diagnosis not present

## 2018-12-05 ENCOUNTER — Other Ambulatory Visit: Payer: Self-pay

## 2018-12-05 ENCOUNTER — Ambulatory Visit (INDEPENDENT_AMBULATORY_CARE_PROVIDER_SITE_OTHER): Payer: BC Managed Care – PPO

## 2018-12-05 DIAGNOSIS — Z3A17 17 weeks gestation of pregnancy: Secondary | ICD-10-CM | POA: Diagnosis not present

## 2018-12-05 DIAGNOSIS — O09212 Supervision of pregnancy with history of pre-term labor, second trimester: Secondary | ICD-10-CM

## 2018-12-05 DIAGNOSIS — O09899 Supervision of other high risk pregnancies, unspecified trimester: Secondary | ICD-10-CM

## 2018-12-05 MED ORDER — HYDROXYPROGESTERONE CAPROATE 250 MG/ML IM OIL
250.0000 mg | TOPICAL_OIL | Freq: Once | INTRAMUSCULAR | Status: AC
  Administered 2018-12-05: 250 mg via INTRAMUSCULAR

## 2018-12-05 NOTE — Progress Notes (Signed)
Pt here today for her first 17P injection. It was given in her LUOQ. Tolerated well.

## 2018-12-12 ENCOUNTER — Other Ambulatory Visit: Payer: Self-pay

## 2018-12-12 ENCOUNTER — Ambulatory Visit (INDEPENDENT_AMBULATORY_CARE_PROVIDER_SITE_OTHER): Payer: BC Managed Care – PPO

## 2018-12-12 DIAGNOSIS — Z3A18 18 weeks gestation of pregnancy: Secondary | ICD-10-CM

## 2018-12-12 DIAGNOSIS — O09212 Supervision of pregnancy with history of pre-term labor, second trimester: Secondary | ICD-10-CM | POA: Diagnosis not present

## 2018-12-12 DIAGNOSIS — O09899 Supervision of other high risk pregnancies, unspecified trimester: Secondary | ICD-10-CM

## 2018-12-12 MED ORDER — HYDROXYPROGESTERONE CAPROATE 250 MG/ML IM OIL
250.0000 mg | TOPICAL_OIL | Freq: Once | INTRAMUSCULAR | Status: AC
  Administered 2018-12-12: 11:00:00 250 mg via INTRAMUSCULAR

## 2018-12-20 ENCOUNTER — Ambulatory Visit (INDEPENDENT_AMBULATORY_CARE_PROVIDER_SITE_OTHER): Payer: BC Managed Care – PPO | Admitting: Obstetrics and Gynecology

## 2018-12-20 ENCOUNTER — Other Ambulatory Visit: Payer: Self-pay

## 2018-12-20 ENCOUNTER — Ambulatory Visit (INDEPENDENT_AMBULATORY_CARE_PROVIDER_SITE_OTHER): Payer: BC Managed Care – PPO

## 2018-12-20 ENCOUNTER — Other Ambulatory Visit: Payer: Self-pay | Admitting: Maternal Newborn

## 2018-12-20 VITALS — BP 122/74 | Wt 205.0 lb

## 2018-12-20 DIAGNOSIS — Z369 Encounter for antenatal screening, unspecified: Secondary | ICD-10-CM

## 2018-12-20 DIAGNOSIS — O24414 Gestational diabetes mellitus in pregnancy, insulin controlled: Secondary | ICD-10-CM

## 2018-12-20 DIAGNOSIS — O09212 Supervision of pregnancy with history of pre-term labor, second trimester: Secondary | ICD-10-CM | POA: Diagnosis not present

## 2018-12-20 DIAGNOSIS — Z3A19 19 weeks gestation of pregnancy: Secondary | ICD-10-CM | POA: Diagnosis not present

## 2018-12-20 DIAGNOSIS — O09899 Supervision of other high risk pregnancies, unspecified trimester: Secondary | ICD-10-CM

## 2018-12-20 DIAGNOSIS — O0990 Supervision of high risk pregnancy, unspecified, unspecified trimester: Secondary | ICD-10-CM

## 2018-12-20 DIAGNOSIS — Z363 Encounter for antenatal screening for malformations: Secondary | ICD-10-CM

## 2018-12-20 MED ORDER — HYDROXYPROGESTERONE CAPROATE 250 MG/ML IM OIL
250.0000 mg | TOPICAL_OIL | Freq: Once | INTRAMUSCULAR | Status: AC
  Administered 2018-12-20: 250 mg via INTRAMUSCULAR

## 2018-12-20 NOTE — Progress Notes (Signed)
Routine Prenatal Care Visit  Subjective  Angela Mccormick is a 33 y.o. G2P0101 at [redacted]w[redacted]d being seen today for ongoing prenatal care.  She is currently monitored for the following issues for this high-risk pregnancy and has Chiari malformation ([place]); Migraines; Syncope; Nodule of neck; Supervision of high risk pregnancy, antepartum; Obesity affecting pregnancy, antepartum; Gestational diabetes mellitus (GDM); Encounter for general adult medical examination with abnormal findings; Rectal bleeding; Otitis media; Short interval between pregnancies affecting pregnancy, antepartum; History of preterm delivery, currently pregnant; and History of cesarean section on their problem list.  ----------------------------------------------------------------------------------- Patient reports no complaints.   Contractions: Not present. Vag. Bleeding: None.  Movement: Present. Denies leaking of fluid.  ----------------------------------------------------------------------------------- The following portions of the patient's history were reviewed and updated as appropriate: allergies, current medications, past family history, past medical history, past social history, past surgical history and problem list. Problem list updated.   Objective  Blood pressure 122/74, weight 205 lb (93 kg), last menstrual period 08/03/2018, currently breastfeeding. Pregravid weight 188 lb (85.3 kg) Total Weight Gain 17 lb (7.711 kg) Urinalysis:      Fetal Status: Fetal Heart Rate (bpm): 155   Movement: Present     General:  Alert, oriented and cooperative. Patient is in no acute distress.  Skin: Skin is warm and dry. No rash noted.   Cardiovascular: Normal heart rate noted  Respiratory: Normal respiratory effort, no problems with respiration noted  Abdomen: Soft, gravid, appropriate for gestational age. Pain/Pressure: Absent     Pelvic:  Cervical exam deferred        Extremities: Normal range of motion.     ental Status:  Normal mood and affect. Normal behavior. Normal judgment and thought content.   US Ob Comp + 14 Wk  Result Date: 12/20/2018 Patient Name: Angela Mccormick DOB: 1986-03-23 MRN: [id number] ULTRASOUND REPORT Location: [facility] Date of Service: 12/20/2018 Indications:Anatomy Ultrasound Findings: Angela Mccormick intrauterine pregnancy is visualized with FHR at 153 BPM. Biometrics give an (U/S) Gestational age of [redacted]w[redacted]d and an (U/S) EDD of 05/03/2019; this correlates with the clinically established Estimated Date of Delivery: 05/10/19 Fetal presentation is Cephalic. EFW: 382 g ( 13 oz ). Placenta: posterior. Grade: 0 AFI: subjectively normal. Anatomic survey is complete and normal; Gender - female.  Impression: 1. [redacted]w[redacted]d Viable Singleton Intrauterine pregnancy by U/S. 2. (U/S) EDD is consistent with Clinically established Estimated Date of Delivery: 05/10/19 . 3. Normal Anatomy Scan Recommendations: 1.Clinical correlation with the patient's History and Physical Exam. [name], RT  There is a singleton gestation with subjectively normal amniotic fluid volume. The fetal biometry correlates with established dating. Detailed evaluation of the fetal anatomy was performed.The fetal anatomical survey appears within normal limits within the resolution of ultrasound as described above.  It must be noted that a normal ultrasound is unable to rule out fetal aneuploidy, subtle defects such as small ASD or VDS may also not be visible on imaging.  [name], MD, [facility], [facility] 12/20/2018, 11:46 AM     Assessment   33 y.o. G2P0101 at [redacted]w[redacted]d by  05/10/2019, by Last Menstrual Period presenting for routine prenatal visit  Plan   SECOND Problems (from 09/06/18 to present)    Problem Noted Resolved   Supervision of high risk pregnancy, antepartum 06/19/2017 by [name], MD No   Priority:  High     Overview Addendum 11/28/2018 11:12 AM by [name], MD    Clinic  [facility] Prenatal Labs  Dating L=10 Blood type: --/--/A POS (08/03 1808)   Genetic Screen Declines Antibody:NEG (08/03 1808)  Anatomic US Complete Rubella:   Varicella:    GTT Early:               Third trimester:  RPR: Non Reactive (08/03 1808)   Rhogam  HBsAg:     TDaP vaccine                       Flu Shot: HIV: Non Reactive (06/26 1052)   Baby Food                                GBS:   Contraception BTL Pap: 06/19/2017, NILM and HPV Negative  CBB     CS/VBAC C-section   Support Person               History of cesarean section 11/28/2018 by [name], Kylyn Sookram, MD No   History of preterm delivery, currently pregnant 11/06/2018 by Tresea MallGledhill, Jane, CNM No   Short interval between pregnancies affecting pregnancy, antepartum 09/11/2018 by Oswaldo ConroySchmid, Jacelyn Y, CNM No   Obesity affecting pregnancy, antepartum 06/19/2017 by [name], Desirie Minteer, MD No       Gestational age appropriate obstetric precautions including but not limited to vaginal bleeding, contractions, leaking of fluid and fetal movement were reviewed in detail with the patient.    Normal anatomy scan  Return in about 4 weeks (around 01/17/2019) for ROB, weekly nurse visit makena injection.  [name], MD, [facility] CoreFACOG [facility], [facility] 12/20/2018, 11:58 AM

## 2018-12-20 NOTE — Progress Notes (Signed)
ROB Anatomy scan/ It is a GIRL! 17P injection

## 2018-12-27 ENCOUNTER — Ambulatory Visit (INDEPENDENT_AMBULATORY_CARE_PROVIDER_SITE_OTHER): Payer: BC Managed Care – PPO

## 2018-12-27 ENCOUNTER — Other Ambulatory Visit: Payer: Self-pay

## 2018-12-27 DIAGNOSIS — O09212 Supervision of pregnancy with history of pre-term labor, second trimester: Secondary | ICD-10-CM

## 2018-12-27 DIAGNOSIS — O09213 Supervision of pregnancy with history of pre-term labor, third trimester: Secondary | ICD-10-CM

## 2018-12-27 DIAGNOSIS — Z3A20 20 weeks gestation of pregnancy: Secondary | ICD-10-CM

## 2018-12-27 DIAGNOSIS — O09899 Supervision of other high risk pregnancies, unspecified trimester: Secondary | ICD-10-CM

## 2018-12-27 MED ORDER — HYDROXYPROGESTERONE CAPROATE 250 MG/ML IM OIL
250.0000 mg | TOPICAL_OIL | Freq: Once | INTRAMUSCULAR | Status: AC
  Administered 2018-12-27: 250 mg via INTRAMUSCULAR

## 2018-12-27 NOTE — Progress Notes (Addendum)
Pt here for her hydroxyprogesterone inj which was given IM right glut.  NDC# [phone].  Refills called for.  Ins is being checked.  Should be here before next Fri.

## 2018-12-31 ENCOUNTER — Telehealth: Payer: Self-pay

## 2018-12-31 NOTE — Telephone Encounter (Addendum)
Spoke w/Pharmacy regarding previous message. Notified Makena was prior authorized initially for entire rx. This is a time sensitive medication. The patient received her last in stock dose on 12/27/2018 & is due for her next injection on 01/03/19. They will send to their [facility] specialist to work on this.

## 2018-12-31 NOTE — Telephone Encounter (Signed)
Pharmacy called to let us know that the pts insurance will not cover the medication being sent to [place] [facility]. Pharmacy stated to change the provider on the prior auth to the NPI# [id number]. Hasve advised pharmacy I will get this message to [place] who takes care of the [facility].

## 2019-01-01 NOTE — Telephone Encounter (Signed)
Noted patient of attempts to get rx refill sent w/o response. She states she had spoken with them yesterday & they were going to try to get it expedited. She will try to call them again tomorrow. Explained I have been on hold 3x today for 10-20 minutes each being transferred to the insurance side of the pharmacy & get disconnected each time.

## 2019-01-01 NOTE — Telephone Encounter (Signed)
Spoke w/[facility] to notify that PA has been updated with [facility] for the NPI that was stated in message below. Asked to reprocess as patient needs refill for upcoming apt 01/03/2019.

## 2019-01-01 NOTE — Telephone Encounter (Signed)
Spoke w/[employer] who PA was orginally done thru. This patient's rx has to process thru her Medical Insurance & not thru rx coverage. Advised that the orignal dispensing pharmacy I Prior authorized for [facility]) was not approved by them & I have received a message from the [facility] ([place], [place]) to contact insurance to give them their NPI# so they can process this patients refill. Info given & PA was updated.

## 2019-01-02 DIAGNOSIS — O09219 Supervision of pregnancy with history of pre-term labor, unspecified trimester: Secondary | ICD-10-CM | POA: Diagnosis not present

## 2019-01-02 NOTE — Telephone Encounter (Signed)
Updated patient on refill status. Will call back when I get confirmation of shipment.

## 2019-01-02 NOTE — Telephone Encounter (Signed)
[facility] called to schedule overnight shipment of Makena.

## 2019-01-02 NOTE — Telephone Encounter (Signed)
Per [employer], benefits were checked yesterday & verified PA on file. It is in final stages of auditing prior to being shipped. Another expedited request sent for shipment to be released & [facility] to be notified.

## 2019-01-02 NOTE — Telephone Encounter (Signed)
Spoke w/pt to notify [facility] scheduled for overnight shipment. Usually arrives by 10am. I will contact her once it is received.

## 2019-01-02 NOTE — Telephone Encounter (Signed)
Pt reports she spoke w/pharmacy this morning & they were contacting the insurance company & told her they would contact us when they were ready to ship.

## 2019-01-02 NOTE — Telephone Encounter (Signed)
Spoke w/insurance side of [employer]. Explained urgent need for Makena to be shipped to be received tomorrow & that the PA has been updated with her medical insurance for the dispensing pharmacy. [facility] dept sent a STAT email to the pharmacy side to process & contact us ASAP.

## 2019-01-03 ENCOUNTER — Ambulatory Visit (INDEPENDENT_AMBULATORY_CARE_PROVIDER_SITE_OTHER): Payer: BC Managed Care – PPO

## 2019-01-03 ENCOUNTER — Other Ambulatory Visit: Payer: Self-pay

## 2019-01-03 DIAGNOSIS — O09212 Supervision of pregnancy with history of pre-term labor, second trimester: Secondary | ICD-10-CM | POA: Diagnosis not present

## 2019-01-03 DIAGNOSIS — Z3A21 21 weeks gestation of pregnancy: Secondary | ICD-10-CM | POA: Diagnosis not present

## 2019-01-03 DIAGNOSIS — O09892 Supervision of other high risk pregnancies, second trimester: Secondary | ICD-10-CM

## 2019-01-03 MED ORDER — HYDROXYPROGESTERONE CAPROATE 250 MG/ML IM OIL
250.0000 mg | TOPICAL_OIL | Freq: Once | INTRAMUSCULAR | Status: AC
  Administered 2019-01-03: 250 mg via INTRAMUSCULAR

## 2019-01-03 NOTE — Progress Notes (Signed)
Pt here for weekly hydroxyprogesterone which was given IM left glut.  NDC# [phone]

## 2019-01-03 NOTE — Telephone Encounter (Signed)
17p arrived. Pt aware.

## 2019-01-10 ENCOUNTER — Other Ambulatory Visit: Payer: Self-pay

## 2019-01-10 ENCOUNTER — Ambulatory Visit (INDEPENDENT_AMBULATORY_CARE_PROVIDER_SITE_OTHER): Payer: BC Managed Care – PPO

## 2019-01-10 DIAGNOSIS — Z3A22 22 weeks gestation of pregnancy: Secondary | ICD-10-CM

## 2019-01-10 DIAGNOSIS — O09899 Supervision of other high risk pregnancies, unspecified trimester: Secondary | ICD-10-CM

## 2019-01-10 DIAGNOSIS — O09212 Supervision of pregnancy with history of pre-term labor, second trimester: Secondary | ICD-10-CM | POA: Diagnosis not present

## 2019-01-10 MED ORDER — HYDROXYPROGESTERONE CAPROATE 250 MG/ML IM OIL
250.0000 mg | TOPICAL_OIL | Freq: Once | INTRAMUSCULAR | Status: AC
  Administered 2019-01-10: 250 mg via INTRAMUSCULAR

## 2019-01-17 ENCOUNTER — Ambulatory Visit (INDEPENDENT_AMBULATORY_CARE_PROVIDER_SITE_OTHER): Payer: BC Managed Care – PPO | Admitting: Obstetrics and Gynecology

## 2019-01-17 ENCOUNTER — Other Ambulatory Visit: Payer: Self-pay

## 2019-01-17 VITALS — BP 110/62 | Wt 213.0 lb

## 2019-01-17 DIAGNOSIS — O0992 Supervision of high risk pregnancy, unspecified, second trimester: Secondary | ICD-10-CM

## 2019-01-17 DIAGNOSIS — O09899 Supervision of other high risk pregnancies, unspecified trimester: Secondary | ICD-10-CM

## 2019-01-17 DIAGNOSIS — O09892 Supervision of other high risk pregnancies, second trimester: Secondary | ICD-10-CM

## 2019-01-17 DIAGNOSIS — O99212 Obesity complicating pregnancy, second trimester: Secondary | ICD-10-CM

## 2019-01-17 DIAGNOSIS — Z23 Encounter for immunization: Secondary | ICD-10-CM

## 2019-01-17 DIAGNOSIS — O09212 Supervision of pregnancy with history of pre-term labor, second trimester: Secondary | ICD-10-CM | POA: Diagnosis not present

## 2019-01-17 DIAGNOSIS — O99210 Obesity complicating pregnancy, unspecified trimester: Secondary | ICD-10-CM

## 2019-01-17 DIAGNOSIS — Z3A23 23 weeks gestation of pregnancy: Secondary | ICD-10-CM

## 2019-01-17 DIAGNOSIS — Z98891 History of uterine scar from previous surgery: Secondary | ICD-10-CM

## 2019-01-17 DIAGNOSIS — Z8751 Personal history of pre-term labor: Secondary | ICD-10-CM

## 2019-01-17 DIAGNOSIS — O0990 Supervision of high risk pregnancy, unspecified, unspecified trimester: Secondary | ICD-10-CM

## 2019-01-17 DIAGNOSIS — O34219 Maternal care for unspecified type scar from previous cesarean delivery: Secondary | ICD-10-CM

## 2019-01-17 LAB — POCT URINALYSIS DIPSTICK OB
Glucose, UA: NEGATIVE
POC,PROTEIN,UA: NEGATIVE

## 2019-01-17 MED ORDER — HYDROXYPROGESTERONE CAPROATE 250 MG/ML IM OIL
250.0000 mg | TOPICAL_OIL | Freq: Once | INTRAMUSCULAR | Status: AC
  Administered 2019-01-17: 250 mg via INTRAMUSCULAR

## 2019-01-17 NOTE — Progress Notes (Signed)
ROB 17P today Flu vaccine given

## 2019-01-17 NOTE — Progress Notes (Signed)
Routine Prenatal Care Visit  Subjective  Angela Mccormick is a 33 y.o. G2P0101 at [redacted]w[redacted]d being seen today for ongoing prenatal care.  She is currently monitored for the following issues for this high-risk pregnancy and has Chiari malformation ([place]); Migraines; Syncope; Nodule of neck; Supervision of high risk pregnancy, antepartum; Obesity affecting pregnancy, antepartum; Gestational diabetes mellitus (GDM); Encounter for general adult medical examination with abnormal findings; Rectal bleeding; Otitis media; Short interval between pregnancies affecting pregnancy, antepartum; History of preterm delivery, currently pregnant; and History of cesarean section on their problem list.  ----------------------------------------------------------------------------------- Patient reports no complaints.   Contractions: Not present. Vag. Bleeding: None.  Movement: Present. Denies leaking of fluid.  ----------------------------------------------------------------------------------- The following portions of the patient's history were reviewed and updated as appropriate: allergies, current medications, past family history, past medical history, past social history, past surgical history and problem list. Problem list updated.   Objective  Blood pressure 110/62, weight 213 lb (96.6 kg), last menstrual period 08/03/2018, currently breastfeeding. Pregravid weight 188 lb (85.3 kg) Total Weight Gain 25 lb (11.3 kg) Urinalysis:      Fetal Status: Fetal Heart Rate (bpm): 145   Movement: Present     General:  Alert, oriented and cooperative. Patient is in no acute distress.  Skin: Skin is warm and dry. No rash noted.   Cardiovascular: Normal heart rate noted  Respiratory: Normal respiratory effort, no problems with respiration noted  Abdomen: Soft, gravid, appropriate for gestational age. Pain/Pressure: Absent     Pelvic:  Cervical exam deferred        Extremities: Normal range of motion.     ental Status:  Normal mood and affect. Normal behavior. Normal judgment and thought content.     Assessment   33 y.o. G2P0101 at [redacted]w[redacted]d by  05/10/2019, by Last Menstrual Period presenting for routine prenatal visit  Plan   SECOND Problems (from 09/06/18 to present)    Problem Noted Resolved   Supervision of high risk pregnancy, antepartum 06/19/2017 by [name], MD No   Priority:  High     Overview Addendum 12/20/2018  9:57 PM by [name], MD    Clinic [facility] Prenatal Labs  Dating L=10 Blood type: --/--/A POS (08/03 1808)   Genetic Screen Declines Antibody:NEG (08/03 1808)  Anatomic [place] Normal XX Rubella:   Varicella: Immune  GTT Early: 117  Third trimester:  RPR: Non Reactive (08/03 1808)   Rhogam N/A HBsAg:     TDaP vaccine                       Flu Shot: HIV: Non Reactive (06/26 1052)   Baby Food                                GBS:   Contraception BTL Pap: 06/19/2017, NILM and HPV Negative  CBB     CS/VBAC C-section   Support Person               History of cesarean section 11/28/2018 by [name], MD No   History of preterm delivery, currently pregnant 11/06/2018 by Rod Can, CNM No   Short interval between pregnancies affecting pregnancy, antepartum 09/11/2018 by Rexene Agent, CNM No   Obesity affecting pregnancy, antepartum 06/19/2017 by [name], MD No       Gestational age appropriate obstetric precautions including but not limited to vaginal bleeding, contractions, leaking of  fluid and fetal movement were reviewed in detail with the patient.    Return in about 4 weeks (around 02/14/2019) for ROB and 28 week labs, continue weekly nurse visit 17-P injections.  Vena AustriaAndreas Derrian Poli, MD, Evern CoreFACOG [facility], [facility] 01/17/2019, 4:16 PM

## 2019-01-24 ENCOUNTER — Other Ambulatory Visit: Payer: Self-pay

## 2019-01-24 ENCOUNTER — Ambulatory Visit (INDEPENDENT_AMBULATORY_CARE_PROVIDER_SITE_OTHER): Payer: BC Managed Care – PPO

## 2019-01-24 DIAGNOSIS — Z3A24 24 weeks gestation of pregnancy: Secondary | ICD-10-CM | POA: Diagnosis not present

## 2019-01-24 DIAGNOSIS — O09212 Supervision of pregnancy with history of pre-term labor, second trimester: Secondary | ICD-10-CM

## 2019-01-24 DIAGNOSIS — O09899 Supervision of other high risk pregnancies, unspecified trimester: Secondary | ICD-10-CM

## 2019-01-24 MED ORDER — HYDROXYPROGESTERONE CAPROATE 250 MG/ML IM OIL
250.0000 mg | TOPICAL_OIL | Freq: Once | INTRAMUSCULAR | Status: AC
  Administered 2019-01-24: 250 mg via INTRAMUSCULAR

## 2019-01-24 NOTE — Progress Notes (Signed)
Patient presents today for Hydroxyprogesterone injection within dates. Given IM RUOQ. Patient tolerated well.  

## 2019-01-29 ENCOUNTER — Telehealth: Payer: Self-pay

## 2019-01-29 DIAGNOSIS — O09219 Supervision of pregnancy with history of pre-term labor, unspecified trimester: Secondary | ICD-10-CM | POA: Diagnosis not present

## 2019-01-29 NOTE — Telephone Encounter (Signed)
LMVM to notify next shipment of 17p has not been received. Patient had pulled her rx app up during 01/24/2019 injection apt & app showed last refill was 01/23/2019 "in process".

## 2019-01-29 NOTE — Telephone Encounter (Signed)
Pt states she was on the phone w/pharmacy when I called. They told her they were sending it overnight & we should receive it tomorrow. I will contact patient to let her know when it is received.

## 2019-01-30 NOTE — Telephone Encounter (Signed)
LMVM to notify makena/17p has arrived.

## 2019-01-31 ENCOUNTER — Other Ambulatory Visit: Payer: Self-pay

## 2019-01-31 ENCOUNTER — Ambulatory Visit (INDEPENDENT_AMBULATORY_CARE_PROVIDER_SITE_OTHER): Payer: BC Managed Care – PPO

## 2019-01-31 DIAGNOSIS — O09212 Supervision of pregnancy with history of pre-term labor, second trimester: Secondary | ICD-10-CM

## 2019-01-31 DIAGNOSIS — O09899 Supervision of other high risk pregnancies, unspecified trimester: Secondary | ICD-10-CM

## 2019-01-31 DIAGNOSIS — Z3A25 25 weeks gestation of pregnancy: Secondary | ICD-10-CM

## 2019-01-31 MED ORDER — HYDROXYPROGESTERONE CAPROATE 250 MG/ML IM OIL
250.0000 mg | TOPICAL_OIL | Freq: Once | INTRAMUSCULAR | Status: AC
  Administered 2019-01-31: 17:00:00 250 mg via INTRAMUSCULAR

## 2019-01-31 NOTE — Progress Notes (Signed)
Pt here for hydroxyprogesterone inj which was given IM left glut.  NDC# [phone] 

## 2019-02-07 ENCOUNTER — Ambulatory Visit (INDEPENDENT_AMBULATORY_CARE_PROVIDER_SITE_OTHER): Payer: BC Managed Care – PPO

## 2019-02-07 ENCOUNTER — Other Ambulatory Visit: Payer: Self-pay

## 2019-02-07 DIAGNOSIS — O09899 Supervision of other high risk pregnancies, unspecified trimester: Secondary | ICD-10-CM

## 2019-02-07 DIAGNOSIS — O09212 Supervision of pregnancy with history of pre-term labor, second trimester: Secondary | ICD-10-CM

## 2019-02-07 DIAGNOSIS — Z3A26 26 weeks gestation of pregnancy: Secondary | ICD-10-CM | POA: Diagnosis not present

## 2019-02-07 MED ORDER — HYDROXYPROGESTERONE CAPROATE 250 MG/ML IM OIL
250.0000 mg | TOPICAL_OIL | Freq: Once | INTRAMUSCULAR | Status: AC
  Administered 2019-02-07: 250 mg via INTRAMUSCULAR

## 2019-02-14 ENCOUNTER — Other Ambulatory Visit: Payer: BC Managed Care – PPO

## 2019-02-14 ENCOUNTER — Encounter: Payer: Self-pay | Admitting: Advanced Practice Midwife

## 2019-02-14 ENCOUNTER — Telehealth: Payer: Self-pay

## 2019-02-14 ENCOUNTER — Ambulatory Visit (INDEPENDENT_AMBULATORY_CARE_PROVIDER_SITE_OTHER): Payer: BC Managed Care – PPO | Admitting: Advanced Practice Midwife

## 2019-02-14 ENCOUNTER — Encounter: Payer: BC Managed Care – PPO | Admitting: Obstetrics and Gynecology

## 2019-02-14 ENCOUNTER — Other Ambulatory Visit: Payer: Self-pay

## 2019-02-14 VITALS — BP 112/54 | Wt 219.0 lb

## 2019-02-14 DIAGNOSIS — Z3A27 27 weeks gestation of pregnancy: Secondary | ICD-10-CM

## 2019-02-14 DIAGNOSIS — O09899 Supervision of other high risk pregnancies, unspecified trimester: Secondary | ICD-10-CM

## 2019-02-14 DIAGNOSIS — O0990 Supervision of high risk pregnancy, unspecified, unspecified trimester: Secondary | ICD-10-CM

## 2019-02-14 DIAGNOSIS — O09212 Supervision of pregnancy with history of pre-term labor, second trimester: Secondary | ICD-10-CM | POA: Diagnosis not present

## 2019-02-14 DIAGNOSIS — O09893 Supervision of other high risk pregnancies, third trimester: Secondary | ICD-10-CM

## 2019-02-14 DIAGNOSIS — O99210 Obesity complicating pregnancy, unspecified trimester: Secondary | ICD-10-CM

## 2019-02-14 DIAGNOSIS — Z98891 History of uterine scar from previous surgery: Secondary | ICD-10-CM

## 2019-02-14 DIAGNOSIS — O09892 Supervision of other high risk pregnancies, second trimester: Secondary | ICD-10-CM

## 2019-02-14 MED ORDER — HYDROXYPROGESTERONE CAPROATE 250 MG/ML IM OIL
250.0000 mg | TOPICAL_OIL | Freq: Once | INTRAMUSCULAR | Status: AC
  Administered 2019-02-14: 250 mg via INTRAMUSCULAR

## 2019-02-14 NOTE — Progress Notes (Addendum)
Routine Prenatal Care Visit  Subjective  Angela Mccormick is a 33 y.o. G2P0101 at [redacted]w[redacted]d being seen today for ongoing prenatal care.  She is currently monitored for the following issues for this high-risk pregnancy and has Chiari malformation ([place]); Migraines; Syncope; Nodule of neck; Supervision of high risk pregnancy, antepartum; Obesity affecting pregnancy, antepartum; Gestational diabetes mellitus (GDM); Encounter for general adult medical examination with abnormal findings; Rectal bleeding; Otitis media; Short interval between pregnancies affecting pregnancy, antepartum; History of preterm delivery, currently pregnant; and History of cesarean section on their problem list.  ----------------------------------------------------------------------------------- Patient reports an area on upper left thigh that has been itching for a week. She is using cortisone cream with some relief.  She denies any other itchy areas. Contractions: Not present. Vag. Bleeding: None.  Movement: Present. Leaking Fluid denies.  ----------------------------------------------------------------------------------- The following portions of the patient's history were reviewed and updated as appropriate: allergies, current medications, past family history, past medical history, past social history, past surgical history and problem list. Problem list updated.  Objective  Blood pressure (!) 112/54, weight 219 lb (99.3 kg), last menstrual period 08/03/2018, currently breastfeeding. Pregravid weight 188 lb (85.3 kg) Total Weight Gain 31 lb (14.1 kg) Urinalysis: Urine Protein    Urine Glucose    Fetal Status: Fetal Heart Rate (bpm): 153 Fundal Height: 30 cm Movement: Present     General:  Alert, oriented and cooperative. Patient is in no acute distress.  Skin: Skin is warm and dry. No rash noted.   Cardiovascular: Normal heart rate noted  Respiratory: Normal respiratory effort, no problems with respiration noted  Abdomen:  Soft, gravid, appropriate for gestational age. Pain/Pressure: Absent     Pelvic:  Cervical exam deferred        Extremities: Normal range of motion.  Edema: None, small bruise seen in area that is itchy, no rash  Mental Status: Normal mood and affect. Normal behavior. Normal judgment and thought content.   Assessment   33 y.o. G2P0101 at [redacted]w[redacted]d by  05/10/2019, by Last Menstrual Period presenting for routine prenatal visit  Plan   SECOND Problems (from 09/06/18 to present)    Problem Noted Resolved   History of cesarean section 11/28/2018 by [name], MD No   History of preterm delivery, currently pregnant 11/06/2018 by Rod Can, CNM No   Short interval between pregnancies affecting pregnancy, antepartum 09/11/2018 by Rexene Agent, CNM No   Supervision of high risk pregnancy, antepartum 06/19/2017 by [name], MD No   Overview Addendum 12/20/2018  9:57 PM by [name], MD    [facility]inic [facility] Prenatal Labs  Dating L=10 Blood type: --/--/A POS (08/03 1808)   Genetic Screen Declines Antibody:NEG (08/03 1808)  Anatomic [place] Normal XX Rubella:   Varicella: Immune  GTT Early: 13  Third trimester:  RPR: Non Reactive (08/03 1808)   Rhogam N/A HBsAg:     TDaP vaccine                       Flu Shot: HIV: Non Reactive (06/26 1052)   Baby Food                                GBS:   Contraception BTL Pap: 06/19/2017, NILM and HPV Negative  CBB     CS/VBAC C-section   Support Person               Obesity affecting  pregnancy, antepartum 06/19/2017 by Vena Austria, MD No       Preterm labor symptoms and general obstetric precautions including but not limited to vaginal bleeding, contractions, leaking of fluid and fetal movement were reviewed in detail with the patient. Please refer to After Visit Summary for other counseling recommendations.   Return in about 2 weeks (around 02/28/2019) for rob and weekly Makena injection.  Tresea Mall, CNM 02/14/2019 11:49 AM

## 2019-02-14 NOTE — Progress Notes (Signed)
ROB 28 week labs 17P injection

## 2019-02-14 NOTE — Telephone Encounter (Signed)
Patient has 1 dose of makena left for 02/21/2019 visit. Called to initiate next refill request. Per [facility] representative, insurance shows next expected refill as 02/26/2019 and will not allow them to release it any sooner than 7 days prior. I will call back 02/19/2019-02/20/2019 to verify release/shipment of next refill.

## 2019-02-14 NOTE — Patient Instructions (Signed)
Third Trimester of Pregnancy The third trimester is from week 28 through week 40 (months 7 through 9). The third trimester is a time when the unborn baby (fetus) is growing rapidly. At the end of the ninth month, the fetus is about 20 inches in length and weighs 6-10 pounds. Body changes during your third trimester Your body will continue to go through many changes during pregnancy. The changes vary from woman to woman. During the third trimester:  Your weight will continue to increase. You can expect to gain 25-35 pounds (11-16 kg) by the end of the pregnancy.  You may begin to get stretch marks on your hips, abdomen, and breasts.  You may urinate more often because the fetus is moving lower into your pelvis and pressing on your bladder.  You may develop or continue to have heartburn. This is caused by increased hormones that slow down muscles in the digestive tract.  You may develop or continue to have constipation because increased hormones slow digestion and cause the muscles that push waste through your intestines to relax.  You may develop hemorrhoids. These are swollen veins (varicose veins) in the rectum that can itch or be painful.  You may develop swollen, bulging veins (varicose veins) in your legs.  You may have increased body aches in the pelvis, back, or thighs. This is due to weight gain and increased hormones that are relaxing your joints.  You may have changes in your hair. These can include thickening of your hair, rapid growth, and changes in texture. Some women also have hair loss during or after pregnancy, or hair that feels dry or thin. Your hair will most likely return to normal after your baby is born.  Your breasts will continue to grow and they will continue to become tender. A yellow fluid (colostrum) may leak from your breasts. This is the first milk you are producing for your baby.  Your belly button may stick out.  You may notice more swelling in your hands,  face, or ankles.  You may have increased tingling or numbness in your hands, arms, and legs. The skin on your belly may also feel numb.  You may feel short of breath because of your expanding uterus.  You may have more problems sleeping. This can be caused by the size of your belly, increased need to urinate, and an increase in your body's metabolism.  You may notice the fetus "dropping," or moving lower in your abdomen (lightening).  You may have increased vaginal discharge.  You may notice your joints feel loose and you may have pain around your pelvic bone. What to expect at prenatal visits You will have prenatal exams every 2 weeks until week 36. Then you will have weekly prenatal exams. During a routine prenatal visit:  You will be weighed to make sure you and the baby are growing normally.  Your blood pressure will be taken.  Your abdomen will be measured to track your baby's growth.  The fetal heartbeat will be listened to.  Any test results from the previous visit will be discussed.  You may have a cervical check near your due date to see if your cervix has softened or thinned (effaced).  You will be tested for Group B streptococcus. This happens between 35 and 37 weeks. Your health care provider may ask you:  What your birth plan is.  How you are feeling.  If you are feeling the baby move.  If you have had any abnormal   symptoms, such as leaking fluid, bleeding, severe headaches, or abdominal cramping.  If you are using any tobacco products, including cigarettes, chewing tobacco, and electronic cigarettes.  If you have any questions. Other tests or screenings that may be performed during your third trimester include:  Blood tests that check for low iron levels (anemia).  Fetal testing to check the health, activity level, and growth of the fetus. Testing is done if you have certain medical conditions or if there are problems during the pregnancy.  Nonstress test  (NST). This test checks the health of your baby to make sure there are no signs of problems, such as the baby not getting enough oxygen. During this test, a belt is placed around your belly. The baby is made to move, and its heart rate is monitored during movement. What is false labor? False labor is a condition in which you feel small, irregular tightenings of the muscles in the womb (contractions) that usually go away with rest, changing position, or drinking water. These are called Braxton Hicks contractions. Contractions may last for hours, days, or even weeks before true labor sets in. If contractions come at regular intervals, become more frequent, increase in intensity, or become painful, you should see your health care provider. What are the signs of labor?  Abdominal cramps.  Regular contractions that start at 10 minutes apart and become stronger and more frequent with time.  Contractions that start on the top of the uterus and spread down to the lower abdomen and back.  Increased pelvic pressure and dull back pain.  A watery or bloody mucus discharge that comes from the vagina.  Leaking of amniotic fluid. This is also known as your "water breaking." It could be a slow trickle or a gush. Let your health care provider know if it has a color or strange odor. If you have any of these signs, call your health care provider right away, even if it is before your due date. Follow these instructions at home: Medicines  Follow your health care provider's instructions regarding medicine use. Specific medicines may be either safe or unsafe to take during pregnancy.  Take a prenatal vitamin that contains at least 600 micrograms (mcg) of folic acid.  If you develop constipation, try taking a stool softener if your health care provider approves. Eating and drinking   Eat a balanced diet that includes fresh fruits and vegetables, whole grains, good sources of protein such as meat, eggs, or tofu,  and low-fat dairy. Your health care provider will help you determine the amount of weight gain that is right for you.  Avoid raw meat and uncooked cheese. These carry germs that can cause birth defects in the baby.  If you have low calcium intake from food, talk to your health care provider about whether you should take a daily calcium supplement.  Eat four or five small meals rather than three large meals a day.  Limit foods that are high in fat and processed sugars, such as fried and sweet foods.  To prevent constipation: ? Drink enough fluid to keep your urine clear or pale yellow. ? Eat foods that are high in fiber, such as fresh fruits and vegetables, whole grains, and beans. Activity  Exercise only as directed by your health care provider. Most women can continue their usual exercise routine during pregnancy. Try to exercise for 30 minutes at least 5 days a week. Stop exercising if you experience uterine contractions.  Avoid heavy lifting.  Do   not exercise in extreme heat or humidity, or at high altitudes.  Wear low-heel, comfortable shoes.  Practice good posture.  You may continue to have sex unless your health care provider tells you otherwise. Relieving pain and discomfort  Take frequent breaks and rest with your legs elevated if you have leg cramps or low back pain.  Take warm sitz baths to soothe any pain or discomfort caused by hemorrhoids. Use hemorrhoid cream if your health care provider approves.  Wear a good support bra to prevent discomfort from breast tenderness.  If you develop varicose veins: ? Wear support pantyhose or compression stockings as told by your healthcare provider. ? Elevate your feet for 15 minutes, 3-4 times a day. Prenatal care  Write down your questions. Take them to your prenatal visits.  Keep all your prenatal visits as told by your health care provider. This is important. Safety  Wear your seat belt at all times when driving.  Make  a list of emergency phone numbers, including numbers for family, friends, the hospital, and police and fire departments. General instructions  Avoid cat litter boxes and soil used by cats. These carry germs that can cause birth defects in the baby. If you have a cat, ask someone to clean the litter box for you.  Do not travel far distances unless it is absolutely necessary and only with the approval of your health care provider.  Do not use hot tubs, steam rooms, or saunas.  Do not drink alcohol.  Do not use any products that contain nicotine or tobacco, such as cigarettes and e-cigarettes. If you need help quitting, ask your health care provider.  Do not use any medicinal herbs or unprescribed drugs. These chemicals affect the formation and growth of the baby.  Do not douche or use tampons or scented sanitary pads.  Do not cross your legs for long periods of time.  To prepare for the arrival of your baby: ? Take prenatal classes to understand, practice, and ask questions about labor and delivery. ? Make a trial run to the hospital. ? Visit the hospital and tour the maternity area. ? Arrange for maternity or paternity leave through employers. ? Arrange for family and friends to take care of pets while you are in the hospital. ? Purchase a rear-facing car seat and make sure you know how to install it in your car. ? Pack your hospital bag. ? Prepare the baby's nursery. Make sure to remove all pillows and stuffed animals from the baby's crib to prevent suffocation.  Visit your dentist if you have not gone during your pregnancy. Use a soft toothbrush to brush your teeth and be gentle when you floss. Contact a health care provider if:  You are unsure if you are in labor or if your water has broken.  You become dizzy.  You have mild pelvic cramps, pelvic pressure, or nagging pain in your abdominal area.  You have lower back pain.  You have persistent nausea, vomiting, or diarrhea.   You have an unusual or bad smelling vaginal discharge.  You have pain when you urinate. Get help right away if:  Your water breaks before 37 weeks.  You have regular contractions less than 5 minutes apart before 37 weeks.  You have a fever.  You are leaking fluid from your vagina.  You have spotting or bleeding from your vagina.  You have severe abdominal pain or cramping.  You have rapid weight loss or weight gain.  You have   shortness of breath with chest pain.  You notice sudden or extreme swelling of your face, hands, ankles, feet, or legs.  Your baby makes fewer than 10 movements in 2 hours.  You have severe headaches that do not go away when you take medicine.  You have vision changes. Summary  The third trimester is from week 28 through week 40, months 7 through 9. The third trimester is a time when the unborn baby (fetus) is growing rapidly.  During the third trimester, your discomfort may increase as you and your baby continue to gain weight. You may have abdominal, leg, and back pain, sleeping problems, and an increased need to urinate.  During the third trimester your breasts will keep growing and they will continue to become tender. A yellow fluid (colostrum) may leak from your breasts. This is the first milk you are producing for your baby.  False labor is a condition in which you feel small, irregular tightenings of the muscles in the womb (contractions) that eventually go away. These are called Braxton Hicks contractions. Contractions may last for hours, days, or even weeks before true labor sets in.  Signs of labor can include: abdominal cramps; regular contractions that start at 10 minutes apart and become stronger and more frequent with time; watery or bloody mucus discharge that comes from the vagina; increased pelvic pressure and dull back pain; and leaking of amniotic fluid. This information is not intended to replace advice given to you by your health  care provider. Make sure you discuss any questions you have with your health care provider. Document Released: 04/25/2001 Document Revised: 08/22/2018 Document Reviewed: 06/06/2016 Elsevier Patient Education  2020 [employer]  

## 2019-02-15 LAB — 28 WEEK RH+PANEL
Basophils Absolute: 0 10*3/uL (ref 0.0–0.2)
Basos: 0 %
EOS (ABSOLUTE): 0 10*3/uL (ref 0.0–0.4)
Eos: 1 %
Gestational Diabetes Screen: 127 mg/dL (ref 65–139)
HIV Screen 4th Generation wRfx: NONREACTIVE
Hematocrit: 34 % (ref 34.0–46.6)
Hemoglobin: 11.1 g/dL (ref 11.1–15.9)
Immature Grans (Abs): 0 10*3/uL (ref 0.0–0.1)
Immature Granulocytes: 0 %
Lymphocytes Absolute: 1 10*3/uL (ref 0.7–3.1)
Lymphs: 13 %
MCH: 28.9 pg (ref 26.6–33.0)
MCHC: 32.6 g/dL (ref 31.5–35.7)
MCV: 89 fL (ref 79–97)
Monocytes Absolute: 0.4 10*3/uL (ref 0.1–0.9)
Monocytes: 6 %
Neutrophils Absolute: 6.4 10*3/uL (ref 1.4–7.0)
Neutrophils: 80 %
Platelets: 243 10*3/uL (ref 150–450)
RBC: 3.84 x10E6/uL (ref 3.77–5.28)
RDW: 14.8 % (ref 11.7–15.4)
RPR Ser Ql: NONREACTIVE
WBC: 7.9 10*3/uL (ref 3.4–10.8)

## 2019-02-19 NOTE — Telephone Encounter (Signed)
Patient is calling to find out about her injection status for this week . Please advise

## 2019-02-21 ENCOUNTER — Ambulatory Visit (INDEPENDENT_AMBULATORY_CARE_PROVIDER_SITE_OTHER): Payer: BC Managed Care – PPO

## 2019-02-21 ENCOUNTER — Other Ambulatory Visit: Payer: Self-pay

## 2019-02-21 DIAGNOSIS — O09899 Supervision of other high risk pregnancies, unspecified trimester: Secondary | ICD-10-CM

## 2019-02-21 DIAGNOSIS — O09893 Supervision of other high risk pregnancies, third trimester: Secondary | ICD-10-CM

## 2019-02-21 DIAGNOSIS — Z3A28 28 weeks gestation of pregnancy: Secondary | ICD-10-CM | POA: Diagnosis not present

## 2019-02-21 MED ORDER — HYDROXYPROGESTERONE CAPROATE 250 MG/ML IM OIL
250.0000 mg | TOPICAL_OIL | Freq: Once | INTRAMUSCULAR | Status: AC
  Administered 2019-02-21: 250 mg via INTRAMUSCULAR

## 2019-02-21 NOTE — Telephone Encounter (Signed)
Patient called to check status of her Makena. She had received a notification the a refill was being processed on 10/2. She wanted to be sure she had meds here for her injection tomorrow 02/21/2019. Advised patient that I attempted to initiate next refill on 02/14/2019. [facility] stated that next refill due 02/26/2019 and insurance would not authorize release of refill any sooner than 7 days prior. Therefore, I had to contact them back on 02/20/2019 to request the refill within 7 days of next refill due date.

## 2019-02-26 DIAGNOSIS — O09219 Supervision of pregnancy with history of pre-term labor, unspecified trimester: Secondary | ICD-10-CM | POA: Diagnosis not present

## 2019-02-26 NOTE — Telephone Encounter (Signed)
Spoke w/Pharmacy to initiate refill. Pharmacy states they are processing and will contact us when ready to ship.

## 2019-02-26 NOTE — Telephone Encounter (Deleted)
Patient states she has been on phone w/pharmacy for the past 2 days. They are ready to schedule shipment, just need to speak to us to authorize. Advised patient I have not received any calls from the pharmacy to attempt to schedule shipment. I will contact them now. 

## 2019-02-26 NOTE — Telephone Encounter (Signed)
Notified pt meds scheduled to arrive tomorrow.

## 2019-02-26 NOTE — Telephone Encounter (Signed)
Patient states she has been on phone w/pharmacy for the past 2 days. They are ready to schedule shipment, just need to speak to [place] to authorize. Advised patient I have not received any calls from the pharmacy to attempt to schedule shipment. I will contact them now.

## 2019-02-26 NOTE — Telephone Encounter (Signed)
Spoke w/Diane at [facility]. Medication scheduled for shipment to arrive tomorrow Thursday 02/27/2019.

## 2019-02-27 NOTE — Telephone Encounter (Signed)
Meds Arrived. Pt aware

## 2019-02-28 ENCOUNTER — Other Ambulatory Visit: Payer: Self-pay

## 2019-02-28 ENCOUNTER — Encounter: Payer: Self-pay | Admitting: Obstetrics and Gynecology

## 2019-02-28 ENCOUNTER — Ambulatory Visit (INDEPENDENT_AMBULATORY_CARE_PROVIDER_SITE_OTHER): Payer: BC Managed Care – PPO | Admitting: Obstetrics and Gynecology

## 2019-02-28 VITALS — BP 118/78 | Wt 218.0 lb

## 2019-02-28 DIAGNOSIS — O09899 Supervision of other high risk pregnancies, unspecified trimester: Secondary | ICD-10-CM

## 2019-02-28 DIAGNOSIS — O0993 Supervision of high risk pregnancy, unspecified, third trimester: Secondary | ICD-10-CM

## 2019-02-28 DIAGNOSIS — O0990 Supervision of high risk pregnancy, unspecified, unspecified trimester: Secondary | ICD-10-CM

## 2019-02-28 DIAGNOSIS — Q0701 Arnold-Chiari syndrome with spina bifida: Secondary | ICD-10-CM

## 2019-02-28 DIAGNOSIS — O34219 Maternal care for unspecified type scar from previous cesarean delivery: Secondary | ICD-10-CM

## 2019-02-28 DIAGNOSIS — Z98891 History of uterine scar from previous surgery: Secondary | ICD-10-CM

## 2019-02-28 DIAGNOSIS — Z3A29 29 weeks gestation of pregnancy: Secondary | ICD-10-CM

## 2019-02-28 DIAGNOSIS — O99213 Obesity complicating pregnancy, third trimester: Secondary | ICD-10-CM

## 2019-02-28 DIAGNOSIS — O99210 Obesity complicating pregnancy, unspecified trimester: Secondary | ICD-10-CM

## 2019-02-28 NOTE — Progress Notes (Signed)
Routine Prenatal Care Visit  Subjective  Angela Mccormick is a 33 y.o. G2P0101 at [redacted]w[redacted]d being seen today for ongoing prenatal care.  She is currently monitored for the following issues for this high-risk pregnancy and has Chiari malformation ([facility]); Migraines; Syncope; Nodule of neck; Supervision of high risk pregnancy, antepartum; Obesity affecting pregnancy, antepartum; Gestational diabetes mellitus (GDM); Encounter for general adult medical examination with abnormal findings; Rectal bleeding; Otitis media; Short interval between pregnancies affecting pregnancy, antepartum; History of preterm delivery, currently pregnant; and History of cesarean section on their problem list.  ----------------------------------------------------------------------------------- Patient reports no complaints.   Contractions: Not present. Vag. Bleeding: None.  Movement: Present. Leaking Fluid denies.  ----------------------------------------------------------------------------------- The following portions of the patient's history were reviewed and updated as appropriate: allergies, current medications, past family history, past medical history, past social history, past surgical history and problem list. Problem list updated.  Objective  Blood pressure 118/78, weight 218 lb (98.9 kg), last menstrual period 08/03/2018, currently breastfeeding. Pregravid weight 188 lb (85.3 kg) Total Weight Gain 30 lb (13.6 kg) Urinalysis: Urine Protein    Urine Glucose    Fetal Status: Fetal Heart Rate (bpm): 145 Fundal Height: 31 cm Movement: Present     General:  Alert, oriented and cooperative. Patient is in no acute distress.  Skin: Skin is warm and dry. No rash noted.   Cardiovascular: Normal heart rate noted  Respiratory: Normal respiratory effort, no problems with respiration noted  Abdomen: Soft, gravid, appropriate for gestational age. Pain/Pressure: Absent     Pelvic:  Cervical exam deferred        Extremities:  Normal range of motion.  Edema: None  Mental Status: Normal mood and affect. Normal behavior. Normal judgment and thought content.   Assessment   33 y.o. G2P0101 at [redacted]w[redacted]d by  05/10/2019, by Last Menstrual Period presenting for routine prenatal visit  Plan   SECOND Problems (from 09/06/18 to present)    Problem Noted Resolved   History of cesarean section 11/28/2018 by Vena Austria, MD No   History of preterm delivery, currently pregnant 11/06/2018 by Tresea Mall, CNM No   Short interval between pregnancies affecting pregnancy, antepartum 09/11/2018 by Oswaldo Conroy, CNM No   Supervision of high risk pregnancy, antepartum 06/19/2017 by Vena Austria, MD No   Overview Addendum 02/18/2019 11:44 AM by Vena Austria, MD    [facility]inic [facility] Prenatal Labs  Dating L=10 Blood type: --/--/A POS (08/03 1808)   Genetic Screen Declines Antibody:NEG (08/03 1808)  Anatomic [place] Normal XX Rubella:   Varicella: Immune  GTT Early: 117  Third trimester: 128 RPR: Non Reactive (08/03 1808)   Rhogam N/A HBsAg:     TDaP vaccine                       Flu Shot: HIV: Non Reactive (06/26 1052)   Baby Food                                GBS:   Contraception BTL Pap: 06/19/2017, NILM and HPV Negative  CBB     CS/VBAC C-section   Support Person               Obesity affecting pregnancy, antepartum 06/19/2017 by Vena Austria, MD No       Preterm labor symptoms and general obstetric precautions including but not limited to vaginal bleeding, contractions, leaking of fluid and fetal movement were  reviewed in detail with the patient. Please refer to After Visit Summary for other counseling recommendations.   - 17OHP today - TDaP next visit - attempted to schedule c-section/BTL today. She desires surgery with Dr.Staebler who will not be available the entire week she would need. She opts to discuss with him.   Return in about 2 weeks (around 03/14/2019) for ultrasound for growth/afi and routine  prenatal after (continue weekly 17OHP injections).  Prentice Docker, MD, [facility], [facility] 02/28/2019 4:39 PM

## 2019-03-03 ENCOUNTER — Other Ambulatory Visit: Payer: Self-pay

## 2019-03-05 ENCOUNTER — Ambulatory Visit (INDEPENDENT_AMBULATORY_CARE_PROVIDER_SITE_OTHER): Payer: BC Managed Care – PPO | Admitting: Family Medicine

## 2019-03-05 ENCOUNTER — Encounter: Payer: Self-pay | Admitting: Family Medicine

## 2019-03-05 ENCOUNTER — Other Ambulatory Visit: Payer: Self-pay

## 2019-03-05 ENCOUNTER — Telehealth: Payer: Self-pay | Admitting: Family Medicine

## 2019-03-05 DIAGNOSIS — Z0000 Encounter for general adult medical examination without abnormal findings: Secondary | ICD-10-CM

## 2019-03-05 NOTE — Patient Instructions (Signed)
Nice to see you. Please continue to stay active and monitor your diet.  Good luck with your pregnancy!

## 2019-03-05 NOTE — Telephone Encounter (Signed)
Noted.  Note was addended.

## 2019-03-05 NOTE — Telephone Encounter (Signed)
Please call the patient.  She reported during her physical today that her mother had a history of colon cancer when she was 13.  It looks like last year she reported that the colon removal in her mother was related to a bleeding issue from diverticulitis when she was younger.  Please confirm with the patient whether or not the patient's mother had colon cancer or a bleeding issue from diverticulitis previously.  If her mother did indeed have colon cancer we will need to refer back to GI.  Thanks.

## 2019-03-05 NOTE — Assessment & Plan Note (Addendum)
Physical exam completed.  Encourage staying active and monitoring her diet.  Support offered given the stressors from earlier this year.  She will let us know if she needs anything moving forward.  She underwent sigmoidoscopy for rectal bleeding last year.  She reports her mother has a history of colon cancer though last year she reported that her mom had a partial colectomy related to a bleeding issue when she was 63.  This was noted after the patient left.  I will have the CMA contact the patient to confirm this and if her mother did indeed have a family history of colon cancer we will refer her back to GI.  No need for lab work at this time.  Consider rechecking labs next year.

## 2019-03-05 NOTE — Progress Notes (Addendum)
[name], MD Phone: [phone]  Angela Mccormick is a 33 y.o. female who presents today for CPE.  Exercise: [place]. Diet: This is got healthier.  She is eating lots of salads.  Healthier snacks.  1 soda per day which is much improved. Pap smear up-to-date on 06/19/2017 with NIL and negative HPV.  No menses as she is currently pregnant. Family history of breast cancer in her maternal great grandmother.  Clarification obtained after the patient left the office.  She denies that her mother had colon cancer and notes her mother had part of her colon removed related to a GI bleed.  No ovarian cancer. She is up-to-date on flu vaccine and tetanus vaccine. No tobacco use, alcohol use, or illicit drug use. She sees a Education officer, community and an ophthalmologist. She reports quite a bit of stress earlier this year as her sister-in-law died in a house fire and her father-in-law was one of the firefighters that responded.  Then her father-in-law and mother-in-law both passed away from a murder suicide.  She notes she is doing okay with this currently.  Active Ambulatory Problems    Diagnosis Date Noted  . Chiari malformation (HCC) 09/26/2012  . Migraines 09/26/2012  . Syncope 09/26/2012  . Nodule of neck 11/01/2016  . Supervision of high risk pregnancy, antepartum 06/19/2017  . Obesity affecting pregnancy, antepartum 06/19/2017  . Gestational diabetes mellitus (GDM) 11/21/2017  . Routine general medical examination at a health care facility 03/04/2018  . Rectal bleeding 03/04/2018  . Otitis media 04/27/2018  . Short interval between pregnancies affecting pregnancy, antepartum 09/11/2018  . History of preterm delivery, currently pregnant 11/06/2018  . History of cesarean section 11/28/2018   Resolved Ambulatory Problems    Diagnosis Date Noted  . Rash 11/01/2016  . Elevated glucose 04/13/2017  . Trying to get pregnant 04/13/2017  . BMI 37.0-37.9, adult 04/13/2017  . Influenza  07/27/2017  . Nausea and vomiting during pregnancy 08/13/2017  . Preterm premature rupture of membranes 12/15/2017  . Postpartum care following cesarean delivery 12/20/2017  . Nonhealing surgical wound 03/04/2018   Past Medical History:  Diagnosis Date  . Asthma   . Depression   . Gestational diabetes   . History of Chiari malformation   . History of syncope   . Migraine     Family History  Problem Relation Age of Onset  . Colon cancer Mother   . Heart disease Mother   . Diabetes Mother   . Stroke Mother   . Diabetes Father     Social History   Socioeconomic History  . Marital status: Married    Spouse name: Not on file  . Number of children: Not on file  . Years of education: Not on file  . Highest education level: Not on file  Occupational History  . Not on file  Social Needs  . Financial resource strain: Not on file  . Food insecurity    Worry: Not on file    Inability: Not on file  . Transportation needs    Medical: Not on file    Non-medical: Not on file  Tobacco Use  . Smoking status: Never Smoker  . Smokeless tobacco: Never Used  Substance and Sexual Activity  . Alcohol use: No    Frequency: Never  . Drug use: No  . Sexual activity: Yes    Partners: Male    Birth control/protection: None  Lifestyle  . Physical activity    Days per  week: Not on file    Minutes per session: Not on file  . Stress: Not on file  Relationships  . Social Musicianconnections    Talks on phone: Not on file    Gets together: Not on file    Attends religious service: Not on file    Active member of club or organization: Not on file    Attends meetings of clubs or organizations: Not on file    Relationship status: Not on file  . Intimate partner violence    Fear of current or ex partner: Not on file    Emotionally abused: Not on file    Physically abused: Not on file    Forced sexual activity: Not on file  Other Topics Concern  . Not on file  Social History Narrative  .  Not on file    ROS  General:  Negative for nexplained weight loss, fever Skin: Negative for new or changing mole, sore that won't heal HEENT: Negative for trouble hearing, trouble seeing, ringing in ears, mouth sores, hoarseness, change in voice, dysphagia. CV:  Negative for chest pain, dyspnea, edema, palpitations Resp: Negative for cough, dyspnea, hemoptysis GI: Negative for nausea, vomiting, diarrhea, constipation, abdominal pain, melena, hematochezia. GU: Positive for frequent urination related to being pregnant, negative for dysuria, incontinence, urinary hesitance, hematuria, vaginal or penile discharge, sexual difficulty, lumps in testicle or breasts MSK: Negative for muscle cramps or aches, joint pain or swelling Neuro: Negative for headaches, weakness, numbness, dizziness, passing out/fainting Psych: Negative for depression, anxiety, memory problems  Objective  Physical Exam Vitals:   03/05/19 1310  BP: 110/70  Pulse: 78  Temp: (!) 97 F (36.1 C)  SpO2: 99%    BP Readings from Last 3 Encounters:  03/05/19 110/70  02/28/19 118/78  02/14/19 (!) 112/54   Wt Readings from Last 3 Encounters:  03/05/19 217 lb 12.8 oz (98.8 kg)  02/28/19 218 lb (98.9 kg)  02/14/19 219 lb (99.3 kg)    Physical Exam Constitutional:      General: She is not in acute distress.    Appearance: She is not diaphoretic.  Eyes:     Conjunctiva/sclera: Conjunctivae normal.     Pupils: Pupils are equal, round, and reactive to light.  Cardiovascular:     Rate and Rhythm: Normal rate and regular rhythm.     Heart sounds: Normal heart sounds.  Pulmonary:     Effort: Pulmonary effort is normal.     Breath sounds: Normal breath sounds.  Abdominal:     General: Bowel sounds are normal. There is no distension.     Palpations: Abdomen is soft.     Tenderness: There is no abdominal tenderness. There is no guarding or rebound.  Musculoskeletal:     Right lower leg: No edema.     Left lower leg:  No edema.  Lymphadenopathy:     Cervical: No cervical adenopathy.  Skin:    General: Skin is warm and dry.  Neurological:     Mental Status: She is alert.      Assessment/Plan:   Routine general medical examination at a health care facility Physical exam completed.  Encourage staying active and monitoring her diet.  Support offered given the stressors from earlier this year.  She will let us know if she needs anything moving forward.  She underwent sigmoidoscopy for rectal bleeding last year.  She reports her mother has a history of colon cancer though last year she reported that her mom had  a partial colectomy related to a bleeding issue when she was 84.  This was noted after the patient left.  I will have the CMA contact the patient to confirm this and if her mother did indeed have a family history of colon cancer we will refer her back to GI.  No need for lab work at this time.  Consider rechecking labs next year.   No orders of the defined types were placed in this encounter.   No orders of the defined types were placed in this encounter.    Tommi Rumps, MD [place]

## 2019-03-07 ENCOUNTER — Ambulatory Visit (INDEPENDENT_AMBULATORY_CARE_PROVIDER_SITE_OTHER): Payer: BC Managed Care – PPO

## 2019-03-07 ENCOUNTER — Other Ambulatory Visit: Payer: Self-pay

## 2019-03-07 DIAGNOSIS — O09893 Supervision of other high risk pregnancies, third trimester: Secondary | ICD-10-CM

## 2019-03-07 DIAGNOSIS — Z3A30 30 weeks gestation of pregnancy: Secondary | ICD-10-CM

## 2019-03-07 DIAGNOSIS — O09213 Supervision of pregnancy with history of pre-term labor, third trimester: Secondary | ICD-10-CM | POA: Diagnosis not present

## 2019-03-07 MED ORDER — HYDROXYPROGESTERONE CAPROATE 250 MG/ML IM OIL
250.0000 mg | TOPICAL_OIL | Freq: Once | INTRAMUSCULAR | Status: AC
  Administered 2019-03-07: 17:00:00 250 mg via INTRAMUSCULAR

## 2019-03-07 NOTE — Patient Instructions (Signed)
Pt received Makena in [place]. Pt tolerated well1

## 2019-03-14 ENCOUNTER — Ambulatory Visit (INDEPENDENT_AMBULATORY_CARE_PROVIDER_SITE_OTHER): Payer: BC Managed Care – PPO

## 2019-03-14 ENCOUNTER — Other Ambulatory Visit: Payer: Self-pay

## 2019-03-14 ENCOUNTER — Ambulatory Visit (INDEPENDENT_AMBULATORY_CARE_PROVIDER_SITE_OTHER): Payer: BC Managed Care – PPO | Admitting: Obstetrics and Gynecology

## 2019-03-14 VITALS — BP 108/68 | Wt 221.0 lb

## 2019-03-14 DIAGNOSIS — O09293 Supervision of pregnancy with other poor reproductive or obstetric history, third trimester: Secondary | ICD-10-CM

## 2019-03-14 DIAGNOSIS — Z98891 History of uterine scar from previous surgery: Secondary | ICD-10-CM

## 2019-03-14 DIAGNOSIS — O0990 Supervision of high risk pregnancy, unspecified, unspecified trimester: Secondary | ICD-10-CM

## 2019-03-14 DIAGNOSIS — O99213 Obesity complicating pregnancy, third trimester: Secondary | ICD-10-CM

## 2019-03-14 DIAGNOSIS — O09899 Supervision of other high risk pregnancies, unspecified trimester: Secondary | ICD-10-CM

## 2019-03-14 DIAGNOSIS — Z3A29 29 weeks gestation of pregnancy: Secondary | ICD-10-CM

## 2019-03-14 DIAGNOSIS — O09893 Supervision of other high risk pregnancies, third trimester: Secondary | ICD-10-CM

## 2019-03-14 DIAGNOSIS — Z8632 Personal history of gestational diabetes: Secondary | ICD-10-CM | POA: Insufficient documentation

## 2019-03-14 DIAGNOSIS — Z23 Encounter for immunization: Secondary | ICD-10-CM | POA: Diagnosis not present

## 2019-03-14 DIAGNOSIS — O99210 Obesity complicating pregnancy, unspecified trimester: Secondary | ICD-10-CM

## 2019-03-14 DIAGNOSIS — Z3A31 31 weeks gestation of pregnancy: Secondary | ICD-10-CM

## 2019-03-14 DIAGNOSIS — O09213 Supervision of pregnancy with history of pre-term labor, third trimester: Secondary | ICD-10-CM | POA: Diagnosis not present

## 2019-03-14 DIAGNOSIS — Z362 Encounter for other antenatal screening follow-up: Secondary | ICD-10-CM | POA: Diagnosis not present

## 2019-03-14 LAB — POCT URINALYSIS DIPSTICK OB

## 2019-03-14 MED ORDER — HYDROXYPROGESTERONE CAPROATE 250 MG/ML IM OIL
250.0000 mg | TOPICAL_OIL | Freq: Once | INTRAMUSCULAR | Status: AC
  Administered 2019-03-14: 250 mg via INTRAMUSCULAR

## 2019-03-14 NOTE — Progress Notes (Signed)
Routine Prenatal Care Visit  Subjective  Angela Mccormick is a 33 y.o. G2P0101 at [redacted]w[redacted]d being seen today for ongoing prenatal care.  She is currently monitored for the following issues for this high-risk pregnancy and has Chiari malformation ([place]); Migraines; Syncope; Nodule of neck; Supervision of high risk pregnancy, antepartum; Obesity affecting pregnancy, antepartum; Routine general medical examination at a health care facility; Rectal bleeding; Otitis media; Short interval between pregnancies affecting pregnancy, antepartum; History of preterm delivery, currently pregnant; History of cesarean section; and History of gestational diabetes on their problem list.  ----------------------------------------------------------------------------------- Patient reports no complaints.   Contractions: Not present. Vag. Bleeding: None.  Movement: Present. Denies leaking of fluid.  ----------------------------------------------------------------------------------- The following portions of the patient's history were reviewed and updated as appropriate: allergies, current medications, past family history, past medical history, past social history, past surgical history and problem list. Problem list updated.   Objective  Blood pressure 108/68, weight 221 lb (100.2 kg), last menstrual period 08/03/2018, currently breastfeeding. Pregravid weight 188 lb (85.3 kg) Total Weight Gain 33 lb (15 kg) Urinalysis:      Fetal Status: Fetal Heart Rate (bpm): 150 Fundal Height: 33 cm Movement: Present     General:  Alert, oriented and cooperative. Patient is in no acute distress.  Skin: Skin is warm and dry. No rash noted.   Cardiovascular: Normal heart rate noted  Respiratory: Normal respiratory effort, no problems with respiration noted  Abdomen: Soft, gravid, appropriate for gestational age. Pain/Pressure: Absent     Pelvic:  Cervical exam deferred        Extremities: Normal range of motion.  Edema: None   ental Status: Normal mood and affect. Normal behavior. Normal judgment and thought content.   US Ob Follow Up  Result Date: 03/14/2019 Patient Name: Angela Mccormick DOB: 1986-03-17 MRN: [id number] ULTRASOUND REPORT Location: [facility] Date of Service: 03/14/2019 Indications:growth/afi Findings: [name] intrauterine pregnancy is visualized with FHR at 136 BPM. Biometrics give an (U/S) Gestational age of [redacted]w[redacted]d and an (U/S) EDD of 05/02/2019; this correlates with the clinically established Estimated Date of Delivery: 05/10/19. Fetal presentation is Cephalic. Placenta: posterior. Grade: 1 AFI: 17.2 cm Growth percentile is 69.9%. EFW: 2191 g ( 4 lb 13 oz ) Impression: 1. [redacted]w[redacted]d Viable Singleton Intrauterine pregnancy previously established criteria. 2. Growth is 69.9 %ile.  AFI is 17.2 cm. Recommendations: 1.Clinical correlation with the patient's History and Physical Exam. [name], RT There is a singleton gestation with normal amniotic fluid volume. The fetal biometry correlates with established dating.  Limited fetal anatomy was performed.The visualized fetal anatomical survey appears within normal limits within the resolution of ultrasound as described above.  It must be noted that a normal ultrasound is unable to rule out fetal aneuploidy.  [name], MD, [facility], [facility] 03/14/2019, 2:59 PM     Assessment   33 y.o. G2P0101 at [redacted]w[redacted]d by  05/10/2019, by Last Menstrual Period presenting for routine prenatal visit  Plan   SECOND Problems (from 09/06/18 to present)    Problem Noted Resolved   Supervision of high risk pregnancy, antepartum 06/19/2017 by [name], MD No   Priority:  High     Overview Addendum 02/18/2019 11:44 AM by [name], MD    [facility] Prenatal Labs  Dating L=10 Blood type: --/--/A POS (08/03 1808)   Genetic Screen Declines Antibody:NEG (08/03 1808)  Anatomic [place] Normal XX Rubella:   Varicella: Immune   GTT Early: 117  Third trimester: 128 RPR: Non Reactive (08/03 1808)   Rhogam N/A HBsAg:     TDaP vaccine                       Flu Shot: HIV: Non Reactive (06/26 1052)   Baby Food                                GBS:   Contraception BTL Pap: 06/19/2017, NILM and HPV Negative  CBB     CS/VBAC C-section   Support Person               History of cesarean section 11/28/2018 by Vena Austria, MD No   History of preterm delivery, currently pregnant 11/06/2018 by Tresea Mall, CNM No   Short interval between pregnancies affecting pregnancy, antepartum 09/11/2018 by Oswaldo Conroy, CNM No   Obesity affecting pregnancy, antepartum 06/19/2017 by Vena Austria, MD No       Gestational age appropriate obstetric precautions including but not limited to vaginal bleeding, contractions, leaking of fluid and fetal movement were reviewed in detail with the patient.    1) Obesity/History of GDM - normal growth and AFI today - some glucose noted in urine today.  Passed early and 28 week 1-hr - If persists discussed obtaining some random home readings given history of GDM   Return in about 2 weeks (around 03/28/2019) for ROB.  Vena Austria, MD, [facility], [facility] 03/14/2019, 3:10 PM

## 2019-03-14 NOTE — Progress Notes (Signed)
ROB 17P/TDAP today Growth scan today

## 2019-03-20 ENCOUNTER — Telehealth: Payer: Self-pay

## 2019-03-20 DIAGNOSIS — Z3A32 32 weeks gestation of pregnancy: Secondary | ICD-10-CM | POA: Diagnosis not present

## 2019-03-20 DIAGNOSIS — O09219 Supervision of pregnancy with history of pre-term labor, unspecified trimester: Secondary | ICD-10-CM | POA: Diagnosis not present

## 2019-03-20 DIAGNOSIS — O09213 Supervision of pregnancy with history of pre-term labor, third trimester: Secondary | ICD-10-CM | POA: Diagnosis not present

## 2019-03-20 NOTE — Telephone Encounter (Signed)
Spoke w/Brianna w/[facility] to initiate refill request. Pt has dose for 03/21/2019 apt. Refill needed prior to 03/28/2019 apt. [place] [facility] states patient has a balance that needs to be paid prior to shipment release.

## 2019-03-20 NOTE — Telephone Encounter (Signed)
Spoke w/patient to notify her to contact [facility] regarding balance.

## 2019-03-21 ENCOUNTER — Other Ambulatory Visit: Payer: Self-pay

## 2019-03-21 ENCOUNTER — Ambulatory Visit (INDEPENDENT_AMBULATORY_CARE_PROVIDER_SITE_OTHER): Payer: BC Managed Care – PPO

## 2019-03-21 DIAGNOSIS — O09213 Supervision of pregnancy with history of pre-term labor, third trimester: Secondary | ICD-10-CM

## 2019-03-21 DIAGNOSIS — Z3A32 32 weeks gestation of pregnancy: Secondary | ICD-10-CM

## 2019-03-21 DIAGNOSIS — Z8751 Personal history of pre-term labor: Secondary | ICD-10-CM

## 2019-03-21 MED ORDER — HYDROXYPROGESTERONE CAPROATE 250 MG/ML IM OIL
250.0000 mg | TOPICAL_OIL | Freq: Once | INTRAMUSCULAR | Status: AC
  Administered 2019-03-21: 250 mg via INTRAMUSCULAR

## 2019-03-24 NOTE — Telephone Encounter (Signed)
Shipment arrive 03/21/2019

## 2019-03-28 ENCOUNTER — Ambulatory Visit (INDEPENDENT_AMBULATORY_CARE_PROVIDER_SITE_OTHER): Payer: BC Managed Care – PPO | Admitting: Obstetrics & Gynecology

## 2019-03-28 ENCOUNTER — Other Ambulatory Visit: Payer: Self-pay

## 2019-03-28 ENCOUNTER — Encounter: Payer: Self-pay | Admitting: Obstetrics & Gynecology

## 2019-03-28 VITALS — BP 120/80 | Wt 223.0 lb

## 2019-03-28 DIAGNOSIS — Z3A33 33 weeks gestation of pregnancy: Secondary | ICD-10-CM | POA: Diagnosis not present

## 2019-03-28 DIAGNOSIS — O0990 Supervision of high risk pregnancy, unspecified, unspecified trimester: Secondary | ICD-10-CM

## 2019-03-28 DIAGNOSIS — O0993 Supervision of high risk pregnancy, unspecified, third trimester: Secondary | ICD-10-CM

## 2019-03-28 DIAGNOSIS — O09213 Supervision of pregnancy with history of pre-term labor, third trimester: Secondary | ICD-10-CM | POA: Diagnosis not present

## 2019-03-28 DIAGNOSIS — O09899 Supervision of other high risk pregnancies, unspecified trimester: Secondary | ICD-10-CM

## 2019-03-28 MED ORDER — HYDROXYPROGESTERONE CAPROATE 250 MG/ML IM OIL
250.0000 mg | TOPICAL_OIL | Freq: Once | INTRAMUSCULAR | Status: AC
  Administered 2019-03-28: 250 mg via INTRAMUSCULAR

## 2019-03-28 NOTE — Progress Notes (Signed)
  Subjective  Fetal Movement? yes Contractions? no Leaking Fluid? no Vaginal Bleeding? no No s/sx PTL Makena weekly  Objective  BP 120/80   Wt 223 lb (101.2 kg)   LMP 08/03/2018 (Exact Date)   BMI 39.50 kg/m  General: NAD Pumonary: no increased work of breathing Abdomen: gravid, non-tender Extremities: no edema Psychiatric: mood appropriate, affect full  Assessment  33 y.o. G2P0101 at [redacted]w[redacted]d by  05/10/2019, by Last Menstrual Period presenting for routine prenatal visit  Plan   Problem List Items Addressed This Visit      Other   Supervision of high risk pregnancy, antepartum   History of preterm delivery, currently pregnant    Other Visit Diagnoses    [redacted] weeks gestation of pregnancy    -  Primary      SECOND Problems (from 09/06/18 to present)    Problem Noted Resolved   History of gestational diabetes 03/14/2019 by [name], MD No   History of cesarean section 11/28/2018 by [name], MD No   History of preterm delivery, currently pregnant 11/06/2018 by Rod Can, CNM No   Short interval between pregnancies affecting pregnancy, antepartum 09/11/2018 by Rexene Agent, CNM No   Supervision of high risk pregnancy, antepartum 06/19/2017 by [name], MD No   Overview Addendum 03/28/2019  4:07 PM by Gae Dry, MD    [facility] Prenatal Labs  Dating L=10 Blood type: --/--/A POS (08/03 1808)   Genetic Screen Declines Antibody:NEG (08/03 1808)  Anatomic [place] Normal XX Rubella:   Varicella: Immune  GTT Early: 88  Third trimester: 128 RPR: Non Reactive (08/03 1808)   Rhogam N/A HBsAg:     TDaP vaccine     02/2019  Flu Shot:01/2019 HIV: Non Reactive (06/26 1052)   Baby Food  Breast                              GBS:   Contraception BTL Pap: 06/19/2017, NILM and HPV Negative  CBB  No   CS/VBAC C-section   Support Person Husband              Obesity affecting pregnancy, antepartum 06/19/2017 by [name], MD No     Desires  repeat CS.      Plan for 39+ weeks      AMS desired but on vacation      Will sch 12/22 [facility]).  If labors prior then call AMS. Cont Makena weekly thru 36 weeks PNV, [place] PTL precautions  Barnett Applebaum, MD, [facility], [facility] 03/28/2019  4:15 PM

## 2019-03-31 ENCOUNTER — Telehealth: Payer: Self-pay | Admitting: Obstetrics & Gynecology

## 2019-03-31 NOTE — Telephone Encounter (Signed)
Patient is aware of H&P at [facility] on 12/16 @ 4:10pm w/ Dr. Kenton Kingfisher, Pre-admit testing phone interview to be scheduled, COVID testing on 12/18, and OR on 05/06/19. Patient is aware to quarantine after COVID testing. Per patient, she is allergic to disposable staples.

## 2019-03-31 NOTE — Telephone Encounter (Signed)
-----   Message from Gae Dry, MD sent at 03/28/2019  4:12 PM EST ----- Regarding: surgery Surgery Booking Request Patient Full Name:  Angela Mccormick  MRN: [id number]  DOB: 02/05/1986  Surgeon: Hoyt Koch, MD  Requested Surgery Date and Time: 05/06/2019 Primary Diagnosis AND Code: Repeat CS Secondary Diagnosis and Code:  Surgical Procedure: Cesarean section with tubal ligation L&D Notification: Yes Admission Status: surgery admit Length of Surgery: 1 hr Special Case Needs: No H&P: Yes Phone Interview???:  Yes Interpreter: No Language:  Medical Clearance:  No Special Scheduling Instructions: no Any known health/anesthesia issues, diabetes, sleep apnea, latex allergy, defibrillator/pacemaker?: No Acuity: P2   (P1 highest, P2 delay may cause harm, P3 low, elective gyn, P4 lowest)

## 2019-04-04 ENCOUNTER — Encounter: Payer: Self-pay | Admitting: Obstetrics and Gynecology

## 2019-04-04 ENCOUNTER — Ambulatory Visit (INDEPENDENT_AMBULATORY_CARE_PROVIDER_SITE_OTHER): Payer: BC Managed Care – PPO | Admitting: Obstetrics and Gynecology

## 2019-04-04 ENCOUNTER — Other Ambulatory Visit: Payer: Self-pay

## 2019-04-04 VITALS — BP 130/80 | Wt 225.0 lb

## 2019-04-04 DIAGNOSIS — O09213 Supervision of pregnancy with history of pre-term labor, third trimester: Secondary | ICD-10-CM | POA: Diagnosis not present

## 2019-04-04 DIAGNOSIS — O09293 Supervision of pregnancy with other poor reproductive or obstetric history, third trimester: Secondary | ICD-10-CM

## 2019-04-04 DIAGNOSIS — O0993 Supervision of high risk pregnancy, unspecified, third trimester: Secondary | ICD-10-CM

## 2019-04-04 DIAGNOSIS — O99213 Obesity complicating pregnancy, third trimester: Secondary | ICD-10-CM

## 2019-04-04 DIAGNOSIS — Z3A34 34 weeks gestation of pregnancy: Secondary | ICD-10-CM | POA: Diagnosis not present

## 2019-04-04 DIAGNOSIS — O34219 Maternal care for unspecified type scar from previous cesarean delivery: Secondary | ICD-10-CM

## 2019-04-04 DIAGNOSIS — Z8632 Personal history of gestational diabetes: Secondary | ICD-10-CM

## 2019-04-04 DIAGNOSIS — Z98891 History of uterine scar from previous surgery: Secondary | ICD-10-CM

## 2019-04-04 DIAGNOSIS — O0990 Supervision of high risk pregnancy, unspecified, unspecified trimester: Secondary | ICD-10-CM

## 2019-04-04 DIAGNOSIS — O99210 Obesity complicating pregnancy, unspecified trimester: Secondary | ICD-10-CM

## 2019-04-04 DIAGNOSIS — O09899 Supervision of other high risk pregnancies, unspecified trimester: Secondary | ICD-10-CM

## 2019-04-04 MED ORDER — HYDROXYPROGESTERONE CAPROATE 250 MG/ML IM OIL
250.0000 mg | TOPICAL_OIL | Freq: Once | INTRAMUSCULAR | Status: AC
  Administered 2019-04-04: 17:00:00 250 mg via INTRAMUSCULAR

## 2019-04-04 NOTE — Progress Notes (Signed)
Routine Prenatal Care Visit  Subjective  Angela Mccormick is a 33 y.o. G2P0101 at [redacted]w[redacted]d being seen today for ongoing prenatal care.  She is currently monitored for the following issues for this high-risk pregnancy and has Chiari malformation (HCC); Migraines; Syncope; Nodule of neck; Supervision of high risk pregnancy, antepartum; Obesity affecting pregnancy, antepartum; Routine general medical examination at a health care facility; Rectal bleeding; Otitis media; Short interval between pregnancies affecting pregnancy, antepartum; History of preterm delivery, currently pregnant; History of cesarean section; and History of gestational diabetes on their problem list.  ----------------------------------------------------------------------------------- Patient reports no complaints.   Contractions: Irritability. Vag. Bleeding: None.  Movement: Present. Leaking Fluid denies.  ----------------------------------------------------------------------------------- The following portions of the patient's history were reviewed and updated as appropriate: allergies, current medications, past family history, past medical history, past social history, past surgical history and problem list. Problem list updated.  Objective  Blood pressure 130/80, weight 225 lb (102.1 kg), last menstrual period 08/03/2018, currently breastfeeding. Pregravid weight 188 lb (85.3 kg) Total Weight Gain 37 lb (16.8 kg) Urinalysis: Urine Protein    Urine Glucose    Fetal Status: Fetal Heart Rate (bpm): 140 Fundal Height: 40 cm Movement: Present     General:  Alert, oriented and cooperative. Patient is in no acute distress.  Skin: Skin is warm and dry. No rash noted.   Cardiovascular: Normal heart rate noted  Respiratory: Normal respiratory effort, no problems with respiration noted  Abdomen: Soft, gravid, appropriate for gestational age. Pain/Pressure: Absent     Pelvic:  Cervical exam deferred        Extremities: Normal range of  motion.  Edema: None  Mental Status: Normal mood and affect. Normal behavior. Normal judgment and thought content.   Assessment   33 y.o. G2P0101 at [redacted]w[redacted]d by  05/10/2019, by Last Menstrual Period presenting for routine prenatal visit  Plan   SECOND Problems (from 09/06/18 to present)    Problem Noted Resolved   History of gestational diabetes 03/14/2019 by Vena Austria, MD No   History of cesarean section 11/28/2018 by Vena Austria, MD No   History of preterm delivery, currently pregnant 11/06/2018 by Tresea Mall, CNM No   Short interval between pregnancies affecting pregnancy, antepartum 09/11/2018 by Oswaldo Conroy, CNM No   Supervision of high risk pregnancy, antepartum 06/19/2017 by Vena Austria, MD No   Overview Addendum 03/28/2019  4:07 PM by Nadara Mustard, MD    [facility] Prenatal Labs  Dating L=10 Blood type: --/--/A POS (08/03 1808)   Genetic Screen Declines Antibody:NEG (08/03 1808)  Anatomic [place] Normal XX Rubella:   Varicella: Immune  GTT Early: 117  Third trimester: 128 RPR: Non Reactive (08/03 1808)   Rhogam N/A HBsAg:     TDaP vaccine     02/2019  Flu Shot:01/2019 HIV: Non Reactive (06/26 1052)   Baby Food  Breast                              GBS:   Contraception BTL Pap: 06/19/2017, NILM and HPV Negative  CBB  No   CS/VBAC C-section   Support Person Husband              Obesity affecting pregnancy, antepartum 06/19/2017 by Vena Austria, MD No       Preterm labor symptoms and general obstetric precautions including but not limited to vaginal bleeding, contractions, leaking of fluid and fetal movement were reviewed in detail  with the patient. Please refer to After Visit Summary for other counseling recommendations.   Return in about 12 days (around 04/16/2019) for Appt 11/25 for Makena injection, appt Routine Prenatal Appointment 12/2 for Growth u/s and ROB after.  Prentice Docker, MD, [facility], [facility] 04/04/2019 5:01 PM

## 2019-04-06 ENCOUNTER — Inpatient Hospital Stay
Admission: EM | Admit: 2019-04-06 | Discharge: 2019-04-10 | DRG: 784 | Disposition: A | Discharge status: Home / Self Care | Payer: BC Managed Care – PPO | Attending: Obstetrics and Gynecology | Admitting: Obstetrics and Gynecology

## 2019-04-06 ENCOUNTER — Other Ambulatory Visit: Payer: Self-pay

## 2019-04-06 ENCOUNTER — Encounter
Admission: EM | Disposition: A | Discharge status: Home / Self Care | Payer: Self-pay | Source: Home / Self Care | Attending: Obstetrics and Gynecology

## 2019-04-06 DIAGNOSIS — O34211 Maternal care for low transverse scar from previous cesarean delivery: Secondary | ICD-10-CM | POA: Diagnosis not present

## 2019-04-06 DIAGNOSIS — J45909 Unspecified asthma, uncomplicated: Secondary | ICD-10-CM | POA: Diagnosis not present

## 2019-04-06 DIAGNOSIS — O99214 Obesity complicating childbirth: Secondary | ICD-10-CM | POA: Diagnosis present

## 2019-04-06 DIAGNOSIS — O42913 Preterm premature rupture of membranes, unspecified as to length of time between rupture and onset of labor, third trimester: Principal | ICD-10-CM | POA: Diagnosis present

## 2019-04-06 DIAGNOSIS — Z20828 Contact with and (suspected) exposure to other viral communicable diseases: Secondary | ICD-10-CM | POA: Diagnosis present

## 2019-04-06 DIAGNOSIS — Z302 Encounter for sterilization: Secondary | ICD-10-CM | POA: Diagnosis not present

## 2019-04-06 DIAGNOSIS — O9952 Diseases of the respiratory system complicating childbirth: Secondary | ICD-10-CM | POA: Diagnosis present

## 2019-04-06 DIAGNOSIS — F53 Postpartum depression: Secondary | ICD-10-CM | POA: Diagnosis not present

## 2019-04-06 DIAGNOSIS — O9081 Anemia of the puerperium: Secondary | ICD-10-CM | POA: Diagnosis not present

## 2019-04-06 DIAGNOSIS — Z9851 Tubal ligation status: Secondary | ICD-10-CM | POA: Diagnosis not present

## 2019-04-06 DIAGNOSIS — Z3A35 35 weeks gestation of pregnancy: Secondary | ICD-10-CM

## 2019-04-06 DIAGNOSIS — O99345 Other mental disorders complicating the puerperium: Secondary | ICD-10-CM | POA: Diagnosis not present

## 2019-04-06 DIAGNOSIS — O26893 Other specified pregnancy related conditions, third trimester: Secondary | ICD-10-CM | POA: Diagnosis not present

## 2019-04-06 DIAGNOSIS — D62 Acute posthemorrhagic anemia: Secondary | ICD-10-CM | POA: Diagnosis not present

## 2019-04-06 DIAGNOSIS — O42013 Preterm premature rupture of membranes, onset of labor within 24 hours of rupture, third trimester: Secondary | ICD-10-CM | POA: Diagnosis not present

## 2019-04-06 DIAGNOSIS — O42919 Preterm premature rupture of membranes, unspecified as to length of time between rupture and onset of labor, unspecified trimester: Secondary | ICD-10-CM | POA: Diagnosis present

## 2019-04-06 LAB — CBC
HCT: 35 % — ABNORMAL LOW (ref 36.0–46.0)
Hemoglobin: 11.7 g/dL — ABNORMAL LOW (ref 12.0–15.0)
MCH: 27.6 pg (ref 26.0–34.0)
MCHC: 33.4 g/dL (ref 30.0–36.0)
MCV: 82.5 fL (ref 80.0–100.0)
Platelets: 218 10*3/uL (ref 150–400)
RBC: 4.24 MIL/uL (ref 3.87–5.11)
RDW: 15.5 % (ref 11.5–15.5)
WBC: 8.3 10*3/uL (ref 4.0–10.5)
nRBC: 0 % (ref 0.0–0.2)

## 2019-04-06 LAB — RUPTURE OF MEMBRANE (ROM)PLUS: Rom Plus: POSITIVE

## 2019-04-06 LAB — TYPE AND SCREEN
ABO/RH(D): A POS
Antibody Screen: NEGATIVE

## 2019-04-06 LAB — SARS CORONAVIRUS 2 BY RT PCR (HOSPITAL ORDER, PERFORMED IN ~~LOC~~ HOSPITAL LAB): SARS Coronavirus 2: NEGATIVE

## 2019-04-06 LAB — GROUP B STREP BY PCR: Group B strep by PCR: NEGATIVE

## 2019-04-06 SURGERY — Surgical Case
Anesthesia: Spinal

## 2019-04-06 MED ORDER — TERBUTALINE SULFATE 1 MG/ML IJ SOLN
0.2500 mg | Freq: Once | INTRAMUSCULAR | Status: AC
  Administered 2019-04-06: 0.25 mg via SUBCUTANEOUS

## 2019-04-06 MED ORDER — SODIUM CHLORIDE 0.9 % IV SOLN
500.0000 mg | Freq: Once | INTRAVENOUS | Status: AC
  Administered 2019-04-07: 500 mg via INTRAVENOUS
  Filled 2019-04-06: qty 500

## 2019-04-06 MED ORDER — BUPIVACAINE HCL 0.5 % IJ SOLN
20.0000 mL | INTRAMUSCULAR | Status: DC
  Filled 2019-04-06: qty 20

## 2019-04-06 MED ORDER — TERBUTALINE SULFATE 1 MG/ML IJ SOLN
INTRAMUSCULAR | Status: AC
  Filled 2019-04-06: qty 1

## 2019-04-06 MED ORDER — BUPIVACAINE 0.25 % ON-Q PUMP DUAL CATH 400 ML
400.0000 mL | INJECTION | Status: DC
  Filled 2019-04-06: qty 400

## 2019-04-06 MED ORDER — BUPIVACAINE HCL (PF) 0.5 % IJ SOLN
INTRAMUSCULAR | Status: AC
  Filled 2019-04-06: qty 30

## 2019-04-06 MED ORDER — MORPHINE SULFATE (PF) 0.5 MG/ML IJ SOLN
INTRAMUSCULAR | Status: AC
  Filled 2019-04-06: qty 10

## 2019-04-06 MED ORDER — OXYTOCIN 40 UNITS IN NORMAL SALINE INFUSION - SIMPLE MED
INTRAVENOUS | Status: AC
  Filled 2019-04-06: qty 1000

## 2019-04-06 MED ORDER — SOD CITRATE-CITRIC ACID 500-334 MG/5ML PO SOLN
30.0000 mL | ORAL | Status: AC
  Administered 2019-04-06: 30 mL via ORAL
  Filled 2019-04-06: qty 30

## 2019-04-06 MED ORDER — FENTANYL CITRATE (PF) 100 MCG/2ML IJ SOLN
INTRAMUSCULAR | Status: AC
  Filled 2019-04-06: qty 2

## 2019-04-06 MED ORDER — CEFAZOLIN SODIUM-DEXTROSE 2-4 GM/100ML-% IV SOLN
2.0000 g | INTRAVENOUS | Status: AC
  Administered 2019-04-06: 2 g via INTRAVENOUS
  Filled 2019-04-06: qty 100

## 2019-04-06 SURGICAL SUPPLY — 29 items
BAG COUNTER SPONGE EZ (MISCELLANEOUS) ×3 IMPLANT
CANISTER SUCT 3000ML PPV (MISCELLANEOUS) ×3 IMPLANT
CATH KIT ON-Q SILVERSOAK 5IN (CATHETERS) ×6 IMPLANT
CHLORAPREP W/TINT 26 (MISCELLANEOUS) ×6 IMPLANT
DERMABOND ADVANCED (GAUZE/BANDAGES/DRESSINGS) ×1
DERMABOND ADVANCED .7 DNX12 (GAUZE/BANDAGES/DRESSINGS) ×2 IMPLANT
DRSG OPSITE POSTOP 4X10 (GAUZE/BANDAGES/DRESSINGS) ×3 IMPLANT
DRSG TELFA 3X8 NADH (GAUZE/BANDAGES/DRESSINGS) ×3 IMPLANT
ELECT CAUTERY BLADE 6.4 (BLADE) ×3 IMPLANT
ELECT REM PT RETURN 9FT ADLT (ELECTROSURGICAL) ×3
ELECTRODE REM PT RTRN 9FT ADLT (ELECTROSURGICAL) ×2 IMPLANT
GAUZE SPONGE 4X4 12PLY STRL (GAUZE/BANDAGES/DRESSINGS) ×3 IMPLANT
GLOVE BIO SURGEON STRL SZ7 (GLOVE) ×3 IMPLANT
GLOVE INDICATOR 7.5 STRL GRN (GLOVE) ×3 IMPLANT
GOWN STRL REUS W/ TWL LRG LVL3 (GOWN DISPOSABLE) ×6 IMPLANT
GOWN STRL REUS W/TWL LRG LVL3 (GOWN DISPOSABLE) ×3
NS IRRIG 1000ML POUR BTL (IV SOLUTION) ×3 IMPLANT
PACK C SECTION AR (MISCELLANEOUS) ×3 IMPLANT
PAD OB MATERNITY 4.3X12.25 (PERSONAL CARE ITEMS) ×3 IMPLANT
PAD PREP 24X41 OB/GYN DISP (PERSONAL CARE ITEMS) ×3 IMPLANT
PENCIL SMOKE ULTRAEVAC 22 CON (MISCELLANEOUS) ×3 IMPLANT
RETRACTOR TRAXI PANNICULUS (MISCELLANEOUS) ×2 IMPLANT
STRIP CLOSURE SKIN 1/2X4 (GAUZE/BANDAGES/DRESSINGS) ×3 IMPLANT
SUT MNCRL AB 4-0 PS2 18 (SUTURE) ×3 IMPLANT
SUT PDS AB 1 TP1 96 (SUTURE) ×6 IMPLANT
SUT VIC AB 0 CTX 36 (SUTURE) ×2
SUT VIC AB 0 CTX36XBRD ANBCTRL (SUTURE) ×4 IMPLANT
SUT VIC AB 2-0 CT1 36 (SUTURE) ×3 IMPLANT
TRAXI PANNICULUS RETRACTOR (MISCELLANEOUS) ×1

## 2019-04-06 NOTE — H&P (Addendum)
Obstetric H&P   Chief Complaint: Leaking fluid  Prenatal Care Provider: [facility]  History of Present Illness: 33 y.o. G2P0101 [redacted]w[redacted]d by 05/10/2019, by Last Menstrual Period presenting to L&D with LOF this evening just prior to presentation.  +FM, no VB, irregular contractions.  Pregnancy notable to history of preterm labor also with preterm rupture of membranes at 33 weeks, with C-section for non-reassuring fetal surveillance.  She also had GDM with her G1 pregnancy, with normal oral glucose testing early and at 28 weeks this pregnancy.  She was managed with 17-P injections this pregnancy.  Most recent growth scan 03/14/2019 2191 g ( 4 lb 13 oz ) 69.9%ile  Pregravid weight 85.3 kg Total Weight Gain 16.8 kg  SECOND Problems (from 09/06/18 to present)    Problem Noted Resolved   Supervision of high risk pregnancy, antepartum 06/19/2017 by Vena Austria, MD No   Priority:  High     Overview Addendum 03/28/2019  4:07 PM by Nadara Mustard, MD    [facility] Prenatal Labs  Dating L=10 Blood type: --/--/A POS (08/03 1808)   Genetic Screen Declines Antibody:NEG (08/03 1808)  Anatomic [place] Normal XX Rubella:   Varicella: Immune  GTT Early: 117  Third trimester: 128 RPR: Non Reactive (08/03 1808)   Rhogam N/A HBsAg:   neg  TDaP vaccine     02/2019  Flu Shot:01/2019 HIV: Non Reactive (06/26 1052)   Baby Food  Breast                              GBS:   Contraception BTL Pap: 06/19/2017, NILM and HPV Negative  CBB  No   CS/VBAC C-section   Support Person Husband              History of gestational diabetes 03/14/2019 by Vena Austria, MD No   History of cesarean section 11/28/2018 by Vena Austria, MD No   History of preterm delivery, currently pregnant 11/06/2018 by Tresea Mall, CNM No   Short interval between pregnancies affecting pregnancy, antepartum 09/11/2018 by Oswaldo Conroy, CNM No   Obesity affecting pregnancy, antepartum 06/19/2017 by Vena Austria, MD No      Review of Systems: 10 point review of systems negative unless otherwise noted in HPI  Past Medical History: Past Medical History:  Diagnosis Date  . Asthma   . Depression   . Gestational diabetes   . History of Chiari malformation   . History of syncope   . Migraine     Past Surgical History: Past Surgical History:  Procedure Laterality Date  . CESAREAN SECTION N/A 12/17/2017   Procedure: CESAREAN SECTION;  Surgeon: Vena Austria, MD;  Location: [facility] ORS;  Service: Obstetrics;  Laterality: N/A;  . FLEXIBLE SIGMOIDOSCOPY N/A 03/12/2018   Procedure: FLEXIBLE SIGMOIDOSCOPY;  Surgeon: Toney Reil, MD;  Location: [facility] ENDOSCOPY;  Service: Gastroenterology;  Laterality: N/A;  . NO PAST SURGERIES    . WISDOM TOOTH EXTRACTION      Past Obstetric History: # 1 - Date: 12/17/17, Sex: Female, Weight: 2660 g, GA: [redacted]w[redacted]d, Delivery: C-Section, Low Transverse, Apgar1: 7, Apgar5: 9, Living: Living, Birth Comments: None  # 2 - Date: None, Sex: None, Weight: None, GA: None, Delivery: None, Apgar1: None, Apgar5: None, Living: None, Birth Comments: None   Past Gynecologic History:  Family History: Family History  Problem Relation Age of Onset  . Colon cancer Mother   . Heart disease Mother   .  Diabetes Mother   . Stroke Mother   . Diabetes Father     Social History: Social History   Socioeconomic History  . Marital status: Married    Spouse name: Not on file  . Number of children: Not on file  . Years of education: Not on file  . Highest education level: Not on file  Occupational History  . Not on file  Social Needs  . Financial resource strain: Not on file  . Food insecurity    Worry: Not on file    Inability: Not on file  . Transportation needs    Medical: Not on file    Non-medical: Not on file  Tobacco Use  . Smoking status: Never Smoker  . Smokeless tobacco: Never Used  Substance and Sexual Activity  . Alcohol use: No    Frequency: Never  . Drug use: No  .  Sexual activity: Yes    Partners: Male    Birth control/protection: None  Lifestyle  . Physical activity    Days per week: Not on file    Minutes per session: Not on file  . Stress: Not on file  Relationships  . Social Herbalist on phone: Not on file    Gets together: Not on file    Attends religious service: Not on file    Active member of club or organization: Not on file    Attends meetings of clubs or organizations: Not on file    Relationship status: Not on file  . Intimate partner violence    Fear of current or ex partner: Not on file    Emotionally abused: Not on file    Physically abused: Not on file    Forced sexual activity: Not on file  Other Topics Concern  . Not on file  Social History Narrative  . Not on file    Medications: Prior to Admission medications   Medication Sig Start Date End Date Taking? Authorizing Provider  prenatal vitamin w/FE, FA (NATACHEW) 29-1 MG CHEW chewable tablet Chew 1 tablet by mouth daily at 12 noon.   Yes [provider]  albuterol (PROVENTIL HFA;VENTOLIN HFA) 108 (90 Base) MCG/ACT inhaler Inhale 2 puffs into the lungs every 6 (six) hours as needed for wheezing or shortness of breath. 03/04/18   Leone Haven, MD  clotrimazole (LOTRIMIN) 1 % cream Apply 1 application topically 2 (two) times daily. 07/24/18   Leone Haven, MD  Doxylamine-Pyridoxine ER (BONJESTA) 20-20 MG TBCR Take 1 tablet by mouth at bedtime. Patient not taking: Reported on 04/06/2019 09/06/18   Rexene Agent, CNM  hydroxyprogesterone caproate (MAKENA) 250 mg/mL OIL injection Inject 1 mL (250 mg total) into the muscle once for 1 dose. Begin injections at 16 weeks and administer weekly through [redacted] weeks gestation 11/06/18 11/06/18  Rod Can, CNM    Allergies: Allergies  Allergen Reactions  . Sumatriptan Succinate Anxiety  . Other     dissolvable staples - body rejects    Physical Exam: Vitals: Blood pressure 111/63, pulse 93,  temperature 98.4 F (36.9 C), temperature source Oral, resp. rate 18, height 5\' 3"  (1.6 m), weight 102.1 kg, last menstrual period 08/03/2018, currently breastfeeding.  Urine Dip Protein:N/A  FHT: 150, moderate, +accels, no decels Toco: q11min  General: NAD HEENT: normocephalic, anicteric Pulmonary: No increased work of breathing Cardiovascular: RRR, distal pulses 2+ Abdomen: Gravid, non-tender Leopolds: vtx Genitourinary: grossly ruptured, with positive ferning Extremities: no edema, erythema, or tenderness Neurologic: Grossly  intact Psychiatric: mood appropriate, affect full  Labs: No results found for this or any previous visit (from the past 24 hour(s)).  Assessment: 33 y.o. G2P0101 847w1d by 05/10/2019, by Last Menstrual Period with preterm perlabor rupture of membranes  Plan: 1) The patient was counseled regarding risk and benefits to proceeding with Cesarean section to expedite delivery.  Risk of cesarean section were discussed including risk of bleeding and need for potential intraoperative or postoperative blood transfusion with a rate of approximately 5% quoted for all Cesarean sections, risk of injury to adjacent organs including but not limited to bowl and bladder, the need for additional surgical procedures to address such injuries, and the risk of infection. .  After consideration of options the patient is amenable to proceed with primary cesarean section for delivery.  2) Fetus - cat I tracing  3) PNL - Blood type A/Positive/-- (05/27 0934) / Anti-bodyscreen Negative (05/27 0934) / Rubella 1.56 (05/27 0934) / Varicella Immune / RPR Non Reactive (10/02 1108) / HBsAg Negative (05/27 0934) / HIV Non Reactive (10/02 1108) / 1-hr OGTT 117 early and 127 at 28 weeks  / GBS    4) Immunization History -  Immunization History  Administered Date(s) Administered  . Influenza,inj,Quad PF,6+ Mos 03/05/2017, 01/28/2018, 01/17/2019  . Influenza-Unspecified 03/05/2017, 01/28/2018,  01/17/2019  . Tdap 07/06/2014, 03/14/2019    5) Disposition - pending delivery  Vena AustriaAndreas Darleen Moffitt, MD, Merlinda FrederickFACOG [facility], [facility] 04/06/2019, 9:34 PM

## 2019-04-06 NOTE — Anesthesia Post-op Follow-up Note (Signed)
Anesthesia QCDR form completed.        

## 2019-04-06 NOTE — Anesthesia Preprocedure Evaluation (Signed)
Anesthesia Evaluation  Patient identified by MRN, date of birth, ID band Patient awake    Reviewed: Allergy & Precautions, H&P , NPO status , Patient's Chart, lab work & pertinent test results  History of Anesthesia Complications Negative for: history of anesthetic complications  Airway Mallampati: II  TM Distance: <3 FB Neck ROM: full    Dental  (+) Chipped, Missing, Dental Advidsory Given   Pulmonary neg shortness of breath, asthma , neg sleep apnea, neg recent URI,           Cardiovascular Exercise Tolerance: Good (-) angina(-) Past MI and (-) DOE negative cardio ROS       Neuro/Psych neg Seizures PSYCHIATRIC DISORDERS Depression Arnold Chiari malformation, no current symptoms    GI/Hepatic negative GI ROS, Neg liver ROS, neg GERD  ,  Endo/Other  diabetes, Type 2Morbid obesity  Renal/GU negative Renal ROS  negative genitourinary   Musculoskeletal   Abdominal   Peds  Hematology negative hematology ROS (+)   Anesthesia Other Findings Past Medical History: No date: Asthma No date: Depression No date: Gestational diabetes No date: History of Chiari malformation No date: History of syncope No date: Migraine  Past Surgical History: 12/17/2017: CESAREAN SECTION; N/A     Comment:  Procedure: CESAREAN SECTION;  Surgeon: [name], MD;  Location: [facility] ORS;  Service: Obstetrics;                Laterality: N/A; No date: NO PAST SURGERIES No date: WISDOM TOOTH EXTRACTION  BMI    Body Mass Index:  35.67 kg/m      Reproductive/Obstetrics negative OB ROS                             Anesthesia Physical  Anesthesia Plan  ASA: III  Anesthesia Plan: Spinal   Post-op Pain Management:    Induction:   PONV Risk Score and Plan:   Airway Management Planned: Natural Airway and Nasal Cannula  Additional Equipment:   Intra-op Plan:   Post-operative Plan:    Informed Consent: I have reviewed the patients History and Physical, chart, labs and discussed the procedure including the risks, benefits and alternatives for the proposed anesthesia with the patient or authorized representative who has indicated his/her understanding and acceptance.     Dental Advisory Given  Plan Discussed with: Anesthesiologist, CRNA and Surgeon  Anesthesia Plan Comments: (Patient consented for risks of anesthesia including but not limited to:  - adverse reactions to medications - risk of intubation if required - damage to teeth, lips or other oral mucosa - sore throat or hoarseness - Damage to heart, brain, lungs or loss of life  Patient voiced understanding.)        Anesthesia Quick Evaluation

## 2019-04-06 NOTE — OB Triage Note (Signed)
Patient came in for observation for possible SROM. Patient reports gush of clear fluid at 2000. Patient denies uterine contractions upon arrival. Patient denies vaginal bleeding and spotting. Vital signs stable and patient afebrile. Patient reports +FM. FHR baseline 155 with moderate variability with accelerations 15 x 15 and no decelerations. Husband at bedside with patient. Will continue to monitor.

## 2019-04-07 ENCOUNTER — Inpatient Hospital Stay: Payer: BC Managed Care – PPO | Admitting: Certified Registered"

## 2019-04-07 ENCOUNTER — Telehealth: Payer: Self-pay | Admitting: Obstetrics and Gynecology

## 2019-04-07 DIAGNOSIS — Z302 Encounter for sterilization: Secondary | ICD-10-CM

## 2019-04-07 LAB — BASIC METABOLIC PANEL
Anion gap: 11 (ref 5–15)
BUN: 6 mg/dL (ref 6–20)
CO2: 17 mmol/L — ABNORMAL LOW (ref 22–32)
Calcium: 8.7 mg/dL — ABNORMAL LOW (ref 8.9–10.3)
Chloride: 108 mmol/L (ref 98–111)
Creatinine, Ser: 0.4 mg/dL — ABNORMAL LOW (ref 0.44–1.00)
GFR calc Af Amer: >60 mL/min (ref >60)
GFR calc non Af Amer: >60 mL/min (ref >60)
Glucose, Bld: 138 mg/dL — ABNORMAL HIGH (ref 70–99)
Potassium: 4 mmol/L (ref 3.5–5.1)
Sodium: 136 mmol/L (ref 135–145)

## 2019-04-07 LAB — CBC
HCT: 31.5 % — ABNORMAL LOW (ref 36.0–46.0)
Hemoglobin: 10.4 g/dL — ABNORMAL LOW (ref 12.0–15.0)
MCH: 27.6 pg (ref 26.0–34.0)
MCHC: 33 g/dL (ref 30.0–36.0)
MCV: 83.6 fL (ref 80.0–100.0)
Platelets: 204 10*3/uL (ref 150–400)
RBC: 3.77 MIL/uL — ABNORMAL LOW (ref 3.87–5.11)
RDW: 15.5 % (ref 11.5–15.5)
WBC: 11.6 10*3/uL — ABNORMAL HIGH (ref 4.0–10.5)
nRBC: 0 % (ref 0.0–0.2)

## 2019-04-07 LAB — RPR: RPR Ser Ql: NONREACTIVE

## 2019-04-07 MED ORDER — KETOROLAC TROMETHAMINE 30 MG/ML IJ SOLN: 30.0000 mg | Freq: Four times a day (QID) | INTRAMUSCULAR | Status: AC | PRN

## 2019-04-07 MED ORDER — NALOXONE HCL 4 MG/10ML IJ SOLN
1.0000 ug/kg/h | INTRAVENOUS | Status: DC | PRN
  Filled 2019-04-07: qty 5

## 2019-04-07 MED ORDER — ZOLPIDEM TARTRATE 5 MG PO TABS
5.0000 mg | ORAL_TABLET | Freq: Every evening | ORAL | Status: DC | PRN
  Filled 2019-04-07: qty 1

## 2019-04-07 MED ORDER — PROMETHAZINE HCL 25 MG/ML IJ SOLN: 6.2500 mg | INTRAMUSCULAR | Status: DC | PRN

## 2019-04-07 MED ORDER — DIBUCAINE (PERIANAL) 1 % EX OINT: 1.0000 "application " | TOPICAL_OINTMENT | CUTANEOUS | Status: DC | PRN

## 2019-04-07 MED ORDER — DIPHENHYDRAMINE HCL 25 MG PO CAPS: 25.0000 mg | ORAL_CAPSULE | Freq: Four times a day (QID) | ORAL | Status: DC | PRN

## 2019-04-07 MED ORDER — SODIUM CHLORIDE 0.9 % IV SOLN
INTRAVENOUS | Status: DC | PRN
  Administered 2019-04-07: 30 ug/min via INTRAVENOUS

## 2019-04-07 MED ORDER — NALBUPHINE HCL 10 MG/ML IJ SOLN: 5.0000 mg | Freq: Once | INTRAMUSCULAR | Status: DC | PRN

## 2019-04-07 MED ORDER — SIMETHICONE 80 MG PO CHEW: 80.0000 mg | CHEWABLE_TABLET | ORAL | Status: DC | PRN

## 2019-04-07 MED ORDER — SENNOSIDES-DOCUSATE SODIUM 8.6-50 MG PO TABS
2.0000 | ORAL_TABLET | ORAL | Status: DC
  Filled 2019-04-07: qty 2

## 2019-04-07 MED ORDER — DIPHENHYDRAMINE HCL 25 MG PO CAPS: 25.0000 mg | ORAL_CAPSULE | ORAL | Status: DC | PRN

## 2019-04-07 MED ORDER — ONDANSETRON HCL 4 MG/2ML IJ SOLN: 4.0000 mg | Freq: Three times a day (TID) | INTRAMUSCULAR | Status: DC | PRN

## 2019-04-07 MED ORDER — ONDANSETRON HCL 4 MG/2ML IJ SOLN
INTRAMUSCULAR | Status: DC | PRN
  Administered 2019-04-07: 4 mg via INTRAVENOUS

## 2019-04-07 MED ORDER — NALOXONE HCL 0.4 MG/ML IJ SOLN: 0.4000 mg | INTRAMUSCULAR | Status: DC | PRN

## 2019-04-07 MED ORDER — NALBUPHINE HCL 10 MG/ML IJ SOLN: 5.0000 mg | INTRAMUSCULAR | Status: DC | PRN

## 2019-04-07 MED ORDER — SIMETHICONE 80 MG PO CHEW
80.0000 mg | CHEWABLE_TABLET | Freq: Three times a day (TID) | ORAL | Status: DC
  Administered 2019-04-07 – 2019-04-10 (×9): 80 mg via ORAL
  Filled 2019-04-07 (×8): qty 1

## 2019-04-07 MED ORDER — BUPIVACAINE IN DEXTROSE 0.75-8.25 % IT SOLN
INTRATHECAL | Status: DC | PRN
  Administered 2019-04-07: 1.6 mL via INTRATHECAL

## 2019-04-07 MED ORDER — MENTHOL 3 MG MT LOZG
1.0000 | LOZENGE | OROMUCOSAL | Status: DC | PRN
  Filled 2019-04-07: qty 9

## 2019-04-07 MED ORDER — OXYTOCIN 40 UNITS IN NORMAL SALINE INFUSION - SIMPLE MED
INTRAVENOUS | Status: DC | PRN
  Administered 2019-04-07: 1000 mL via INTRAVENOUS

## 2019-04-07 MED ORDER — SODIUM CHLORIDE 0.9% FLUSH: 3.0000 mL | INTRAVENOUS | Status: DC | PRN

## 2019-04-07 MED ORDER — FENTANYL CITRATE (PF) 100 MCG/2ML IJ SOLN: 25.0000 ug | INTRAMUSCULAR | Status: DC | PRN

## 2019-04-07 MED ORDER — OXYTOCIN 40 UNITS IN NORMAL SALINE INFUSION - SIMPLE MED
2.5000 [IU]/h | INTRAVENOUS | Status: DC
  Filled 2019-04-07: qty 1000

## 2019-04-07 MED ORDER — MEPERIDINE HCL 25 MG/ML IJ SOLN: 6.2500 mg | INTRAMUSCULAR | Status: DC | PRN

## 2019-04-07 MED ORDER — COCONUT OIL OIL
1.0000 "application " | TOPICAL_OIL | Status: DC | PRN
  Administered 2019-04-07: 1 via TOPICAL
  Filled 2019-04-07: qty 120

## 2019-04-07 MED ORDER — ENOXAPARIN SODIUM 40 MG/0.4ML ~~LOC~~ SOLN: 40.0000 mg | SUBCUTANEOUS | Status: DC

## 2019-04-07 MED ORDER — KETOROLAC TROMETHAMINE 30 MG/ML IJ SOLN
30.0000 mg | Freq: Four times a day (QID) | INTRAMUSCULAR | Status: AC | PRN
  Administered 2019-04-07 – 2019-04-08 (×5): 30 mg via INTRAVENOUS
  Filled 2019-04-07 (×5): qty 1

## 2019-04-07 MED ORDER — FENTANYL CITRATE (PF) 100 MCG/2ML IJ SOLN
INTRAMUSCULAR | Status: DC | PRN
  Administered 2019-04-07: 12.5 ug via INTRATHECAL

## 2019-04-07 MED ORDER — MORPHINE SULFATE (PF) 0.5 MG/ML IJ SOLN
INTRAMUSCULAR | Status: DC | PRN
  Administered 2019-04-07: .1 mg via INTRATHECAL

## 2019-04-07 MED ORDER — WITCH HAZEL-GLYCERIN EX PADS: 1.0000 "application " | MEDICATED_PAD | CUTANEOUS | Status: DC | PRN

## 2019-04-07 MED ORDER — PRENATAL MULTIVITAMIN CH
1.0000 | ORAL_TABLET | Freq: Every day | ORAL | Status: DC
  Administered 2019-04-07 – 2019-04-09 (×3): 1 via ORAL
  Filled 2019-04-07 (×3): qty 1

## 2019-04-07 MED ORDER — LACTATED RINGERS IV SOLN
INTRAVENOUS | Status: DC | PRN
  Administered 2019-04-07: via INTRAVENOUS

## 2019-04-07 MED ORDER — DIPHENHYDRAMINE HCL 50 MG/ML IJ SOLN: 12.5000 mg | INTRAMUSCULAR | Status: DC | PRN

## 2019-04-07 MED ORDER — SIMETHICONE 80 MG PO CHEW
80.0000 mg | CHEWABLE_TABLET | ORAL | Status: DC
  Filled 2019-04-07: qty 1

## 2019-04-07 MED ORDER — ENOXAPARIN SODIUM 40 MG/0.4ML ~~LOC~~ SOLN
40.0000 mg | SUBCUTANEOUS | Status: DC
  Administered 2019-04-08 – 2019-04-10 (×3): 40 mg via SUBCUTANEOUS
  Filled 2019-04-07 (×3): qty 0.4

## 2019-04-07 MED ORDER — LACTATED RINGERS IV SOLN: INTRAVENOUS | Status: DC

## 2019-04-07 MED ORDER — IBUPROFEN 800 MG PO TABS
800.0000 mg | ORAL_TABLET | Freq: Three times a day (TID) | ORAL | Status: DC
  Administered 2019-04-08 – 2019-04-10 (×7): 800 mg via ORAL
  Filled 2019-04-07 (×7): qty 1

## 2019-04-07 MED ORDER — OXYTOCIN 40 UNITS IN NORMAL SALINE INFUSION - SIMPLE MED
INTRAVENOUS | Status: AC
  Filled 2019-04-07: qty 1000

## 2019-04-07 MED ORDER — OXYCODONE-ACETAMINOPHEN 5-325 MG PO TABS
1.0000 | ORAL_TABLET | ORAL | Status: DC | PRN
  Administered 2019-04-08 – 2019-04-10 (×8): 1 via ORAL
  Filled 2019-04-07: qty 2
  Filled 2019-04-07 (×8): qty 1

## 2019-04-07 NOTE — Anesthesia Postprocedure Evaluation (Signed)
Anesthesia Post Note  Patient: Angela Mccormick  Procedure(s) Performed: CESAREAN SECTION WITH BILATERAL TUBAL LIGATION  Anesthesia Type: Spinal     Last Vitals:  Vitals:   04/07/19 0457 04/07/19 0558  BP: 111/69 108/62  Pulse: 85   Resp: 16 16  Temp: 36.8 C 36.8 C  SpO2: 98%     Last Pain:  Vitals:   04/07/19 0606  TempSrc:   PainSc: 0-No pain                 Lanora Manis

## 2019-04-07 NOTE — Progress Notes (Signed)
Obstetric Postpartum/PostOperative Daily Progress Note Subjective:  33 y.o. [id number] post-operative day # 0 status post primary cesarean delivery.  She is not ambulating, is tolerating po, is not voiding spontaneously.  She is doing well. She delivered early this morning. Baby is in the NICU due to respiratory issues of prematurity.    Medications SCHEDULED MEDICATIONS  . [START ON 04/08/2019] enoxaparin (LOVENOX) injection  40 mg Subcutaneous Q24H  . [START ON 04/08/2019] ibuprofen  800 mg Oral Q8H  . oxytocin 40 units in NS 1000 mL      . prenatal multivitamin  1 tablet Oral Q1200  . [START ON 04/08/2019] senna-docusate  2 tablet Oral Q24H  . simethicone  80 mg Oral TID PC  . [START ON 04/08/2019] simethicone  80 mg Oral Q24H    MEDICATION INFUSIONS  . lactated ringers    . naLOXone [facility]) adult infusion for PRURITIS    . oxytocin      PRN MEDICATIONS  coconut oil, witch hazel-glycerin **AND** dibucaine, diphenhydrAMINE **OR** diphenhydrAMINE, diphenhydrAMINE, ketorolac **OR** ketorolac, menthol-cetylpyridinium, nalbuphine **OR** nalbuphine, nalbuphine **OR** nalbuphine, naloxone **AND** sodium chloride flush, naLOXone (NARCAN) adult infusion for PRURITIS, ondansetron (ZOFRAN) IV, [START ON 04/08/2019] oxyCODONE-acetaminophen, simethicone, zolpidem    Objective:   Vitals:   04/07/19 0457 04/07/19 0558 04/07/19 0738 04/07/19 1121  BP: 111/69 108/62 107/71 110/65  Pulse: 85  78 80  Resp: 16 16 18 20   Temp: 98.2 F (36.8 C) 98.3 F (36.8 C) 98.4 F (36.9 C) 98 F (36.7 C)  TempSrc: Oral Oral Oral   SpO2: 98%  99% 99%  Weight:      Height:        Current Vital Signs 24h Vital Sign Ranges  T 98 F (36.7 C) Temp  Avg: 98.3 F (36.8 C)  Min: 98 F (36.7 C)  Max: 98.4 F (36.9 C)  BP 110/65 BP  Min: 90/76  Max: 123/107  HR 80 Pulse  Avg: 92.3  Min: 78  Max: 103  RR 20 Resp  Avg: 16.3  Min: 12  Max: 26  SaO2 99 % Room Air SpO2  Avg: 98.5 %  Min: 96 %  Max: 100 %       24  Hour I/O Current Shift I/O  Time Ins Outs 11/22 0701 - 11/23 0700 In: 500 [I.V.:500] Out: 830 [Urine:450] 11/23 0701 - 11/23 1900 In: 360 [P.O.:360] Out: 200 [Urine:200]  General: NAD Pulmonary: no increased work of breathing Inc: Clean/dry/intact Extremities: no edema, no erythema, no tenderness  Labs:  Recent Labs  Lab 04/06/19 2209 04/07/19 0531  WBC 8.3 11.6*  HGB 11.7* 10.4*  HCT 35.0* 31.5*  PLT 218 204     Assessment:   33 y.o. G9Q1194 postoperative day # 0 status post repeat cesarean section and bilateral tubal ligation  Plan:  1) Acute blood loss anemia - hemodynamically stable and asymptomatic - po ferrous sulfate  2) A POS / Rubella 1.56 (05/27 0934)/ Varicella Immune  3) TDAP status: received 03/14/2019 Influenza vaccine status: received 01/17/2019  4) breastfeed /Contraception = bilateral tubal ligation  5) Disposition - home POD#3, most likely  Will Bonnet, MD, [facility] 04/07/2019 11:57 AM

## 2019-04-07 NOTE — Plan of Care (Signed)
Transferred to room 339 from B/P. Alert and oriented with aprop. Affect. Color good, skin w&d. BBS clear. Foley draining clear amber Urine. Honeycomb dressing d&I. Oriented to room, Fall prevention and POC and Pt. V/O. Placed on continuous Pulse Ox. As per order.

## 2019-04-07 NOTE — Discharge Summary (Signed)
Obstetric Discharge Summary Reason for Admission: rupture of membranes Prenatal Procedures: none Intrapartum Procedures: cesarean: low cervical, transverse Postpartum Procedures: P.P. tubal ligation Complications-Operative and Postpartum: none   Hemoglobin  Date Value Ref Range Status  04/07/2019 10.4 (L) 12.0 - 15.0 g/dL Final  02/14/2019 11.1 11.1 - 15.9 g/dL Final   HCT  Date Value Ref Range Status  04/07/2019 31.5 (L) 36.0 - 46.0 % Final   Hematocrit  Date Value Ref Range Status  02/14/2019 34.0 34.0 - 46.6 % Final   Patient reports doing well- just tired on day of discharge. She is tolerating regular diet. Her pain is controlled with PO medication. She is ambulating and voiding without difficulty. Breast pumping is going well.  Physical Exam:  General: alert, cooperative, appears stated age and no distress Lochia: appropriate Uterine Fundus: firm Incision: honeycomb dressing is C/D/I DVT Evaluation: No evidence of DVT seen on physical exam.  Discharge Diagnoses: preterm pregnancy- delivered, postpartum tubal ligation, lactating, depression  Discharge Information: Date: 04/10/2019 Activity: pelvic rest, driving restriction, lifting restriction Diet: routine   Allergies as of 04/10/2019      Reactions   Sumatriptan Succinate Anxiety   Other    dissolvable staples - body rejects      Medication List    STOP taking these medications   clotrimazole 1 % cream Commonly known as: LOTRIMIN   Doxylamine-Pyridoxine ER 20-20 MG Tbcr Commonly known as: Bonjesta   hydroxyprogesterone caproate 250 mg/mL Oil injection Commonly known as: MAKENA     TAKE these medications   albuterol 108 (90 Base) MCG/ACT inhaler Commonly known as: VENTOLIN HFA Inhale 2 puffs into the lungs every 6 (six) hours as needed for wheezing or shortness of breath.   oxyCODONE 5 MG immediate release tablet Commonly known as: Roxicodone Take 1 tablet (5 mg total) by mouth every 6 (six) hours  as needed for up to 5 days for severe pain.   prenatal vitamin w/FE, FA 29-1 MG Chew chewable tablet Chew 1 tablet by mouth daily at 12 noon.   sertraline 25 MG tablet Commonly known as: ZOLOFT Take 1 tablet (25 mg total) by mouth daily. Start taking on: April 11, 2019            Discharge Care Instructions  (From admission, onward)         Start     Ordered   04/10/19 0000  Discharge wound care:    Comments: Keep incision dry, clean.   04/10/19 0942          Condition: stable  Discharge to: home Follow-up Information    [name], MD In 1 week.   Specialty: Obstetrics and Gynecology Why: For wound re-check Contact information: [address] [place] [address] [phone]           Newborn Data: Live born female Angela Mccormick Birth Weight: 6 lb 7.4 oz (2930 g) APGAR: 8, 9  Newborn Delivery   Birth date/time: 04/07/2019 00:50:00 Delivery type: C-Section, Low Transverse Trial of labor: No C-section categorization: Repeat     Newborn status: NICU   Rod Can, CNM 04/10/2019, 9:43 AM

## 2019-04-07 NOTE — Op Note (Signed)
Preoperative Diagnosis: 1) 33 y.o. G2P0101 at [redacted]w[redacted]d 2) Desires permanent surgical sterilization 3) Preterm rupture of membranes  Postoperative Diagnosis: 1) 33 y.o. G2P0101 at [redacted]w[redacted]d 2) Desires permanent surgical sterilization 3) 3) Preterm rupture of membranes   Operation Performed: Repeat low transverse C-section via pfannenstiel skin incision and Pomeroy tubal ligation  Anesthesia: .Spinal  Primary Surgeon: Vena Austria, MD  Assistant: [facility] Surgical Technologist  Preoperative Antibiotics: 2g ancef, 500mg  azithromycin  Estimated Blood Loss: 300 mL  IV Fluids:  Urine Output:: [id number] clear urine  Drains or Tubes: Foley to gravity drainage, ON-Q catheter system  Implants: none  Specimens Removed: none  Complications: none  Intraoperative Findings:  Normal tubes ovaries and uterus.  Delivery resulted in the birth of a liveborn female, APGAR (1 MIN): 8   APGAR (5 MINS): 9, weight pending.  There was significant scarring of the rectus fascia encountered.  The bladder was likewise adhered to the lower uterine segment at the site of prior hysterotomy incision.  Patient Condition: stable  Procedure in Detail:  Patient was taken to the operating room were she was administered regional anesthesia.  She was positioned in the supine position, prepped and draped in the  Usual sterile fashion.  Prior to proceeding with the case a time out was performed and the level of anesthetic was checked and noted to be adequate.  Utilizing the scalpel a pfannenstiel skin incision was made 2cm above the pubic symphysis utilizing the patient's pre-existing scar and carried down sharply to the the level of the rectus fascia.  The fascia was incised in the midline using the scalpel and then extended using mayo scissors.  The superior border of the rectus fascia was grasped with two Kocher clamps and the underlying rectus muscles were dissected of the fascia using blunt dissection.  The  median raphae was incised using Mayo scissors.   The inferior border of the rectus fascia was dissected of the rectus muscles in a similar fashion.  The midline was identified, the peritoneum was entered bluntly and expanded using manual tractions.  The uterus was noted to be in a none rotated position.  Next the bladder blade was placed retracting the bladder caudally.  A bladder flap was created.  A low transverse incision was scored on the lower uterine segment.  The hysterotomy was entered bluntly using the operators finger.  The hysterotomy incision was extended using manual traction.  The operators hand was placed within the hysterotomy position noting the fetus to be within the OA position.  The vertex was grasped, flexed, brought to the incision, and delivered a traumatically using fundal pressure.  The remainder of the body delivered with ease.  The infant was suctioned, cord was clamped and cut before handing off to the awaiting neonatologist.  The placenta was delivered using manual extraction.  The uterus was exteriorized, wiped clean of clots and debris using two moist laps.  The hysterotomy was closed using a two layer closure of 0 Vicryl, with the first being a running locked, the second a vertical imbricating.  The right tube was grasped in a mid isthmic portion using a Babcock clamp, before being double suture ligated using a 0 chromic wheel.  The intervening nuckel of tube was excised using Metzenbaum scissors.  Complete cross section of tubal ostia visualized, and noted to be hemostatic  This procedure was then repeated in similar fashion for the patient left tube.    The uterus was returned to the abdomen.  The  peritoneal gutters were wiped clean of clots and debris using two moist laps.  The hysterotomy incision and tubal pedicles were re-inspected noted to be hemostatic. The rectus muscles were inspected noted to be hemostatic.  The superior border of the rectus fascia was grasped with a  Kocher clamp.  The fascia was closed using a looped #1 PDS in a running fashion taking 1cm by 1cm bites.  The subcutaneous tissue was irrigated using warm saline, hemostasis achieved using the bovie.  The subcutaneous dead space was greater than 3cm and was closed.  The subcutaneous dead space was obliterated by using a 53-T 0 Chromic in a running fashion.   The skin was closed using 4-0 Monocryl in a subcuticular fashion.  Sponge needle and instrument counts were corrects times two.  The patient tolerated the procedure well and was taken to the recovery room in stable condition.

## 2019-04-07 NOTE — Anesthesia Procedure Notes (Signed)
Spinal  Patient location during procedure: OR Start time: 04/07/2019 12:12 AM End time: 04/07/2019 12:25 AM Staffing Anesthesiologist: Martha Clan, MD Resident/CRNA: Chrislynn Mosely, Einar Grad, CRNA Performed: resident/CRNA and anesthesiologist  Preanesthetic Checklist Completed: patient identified, site marked, surgical consent, pre-op evaluation, timeout performed, IV checked, risks and benefits discussed and monitors and equipment checked Spinal Block Patient position: sitting Prep: DuraPrep Patient monitoring: heart rate, cardiac monitor, continuous pulse ox and blood pressure Approach: midline Location: L3-4 Injection technique: single-shot Needle Needle type: Sprotte  Needle gauge: 24 G Needle length: 9 cm Assessment Sensory level: T4 Additional Notes IV functioning, monitors applied to pt. Expiration date of kit checked and confirmed to be in date. Sterile prep and drape, hand hygiene and sterile gloved used. Pt was positioned and spine was prepped in sterile fashion. Skin was anesthetized with lidocaine. Free flow of clear CSF obtained prior to injecting local anesthetic into CSF x 1 attempt. Spinal needle aspirated freely following injection. Needle was carefully withdrawn, and pt tolerated procedure well. Loss of motor and sensory on exam post injection.

## 2019-04-07 NOTE — Transfer of Care (Signed)
Immediate Anesthesia Transfer of Care Note  Patient: Angela Mccormick  Procedure(s) Performed: CESAREAN SECTION WITH BILATERAL TUBAL LIGATION  Patient Location: Mother/Baby  Anesthesia Type:Spinal  Level of Consciousness: awake, alert  and oriented  Airway & Oxygen Therapy: Patient Spontanous Breathing  Post-op Assessment: Report given to RN and Post -op Vital signs reviewed and stable  Post vital signs: Reviewed and stable  Last Vitals:  Vitals Value Taken Time  BP    Temp    Pulse    Resp    SpO2      Last Pain:  Vitals:   04/06/19 2104  TempSrc:   PainSc: 0-No pain         Complications: No apparent anesthesia complications

## 2019-04-07 NOTE — Anesthesia Postprocedure Evaluation (Signed)
Anesthesia Post Note  Patient: Angela Mccormick  Procedure(s) Performed: CESAREAN SECTION WITH BILATERAL TUBAL LIGATION  Patient location during evaluation: Mother Baby Anesthesia Type: Spinal Level of consciousness: awake and alert and oriented Pain management: pain level controlled Vital Signs Assessment: post-procedure vital signs reviewed and stable Respiratory status: spontaneous breathing and respiratory function stable Cardiovascular status: stable Postop Assessment: no headache, no backache, no apparent nausea or vomiting, adequate PO intake and patient able to bend at knees Anesthetic complications: no Comments: Pt has not ambulated yet. Numbness gone from legs/feet. Will report any problems when she gets up      Last Vitals:  Vitals:   04/07/19 0457 04/07/19 0558  BP: 111/69 108/62  Pulse: 85   Resp: 16 16  Temp: 36.8 C 36.8 C  SpO2: 98%     Last Pain:  Vitals:   04/07/19 0606  TempSrc:   PainSc: 0-No pain                 Angela Mccormick

## 2019-04-07 NOTE — Telephone Encounter (Signed)
Patient delivered last night. Patient calling to cancel appointments and schedule one week incisions check. Patient is schedule for H&P on 04/17/19. Would you cancel this appointment?

## 2019-04-07 NOTE — Lactation Note (Addendum)
This note was copied from a baby's chart. Lactation Consultation Note  Patient Name: Angela Mccormick Today's Date: 04/07/2019   Mom delivered 35.2 week baby by C/S who is in SCN.  Mom's first baby was born at 53 weeks and was in NICU for 1 month.  She tried to breast feed her first baby when he was 42 weeks old without success but ended up pumping for 4 months.  Mom has small, dry, flat nipples that slightly invert when compressed.  Pumping has helped to evert nipples.  Have to remind mom to pump more often for [facility].  She has not expressed any milk with pumpings so far.  Explained this was normal d/t viscosity of colostrum.  Encouraged mom to keep pumping for stimulation.  Coconut oil given to rub on flange of pump to get better seal, for dry nipples and for comfort.  Assisted mom with expressing her milk setting up Symphony pump in room with instructions in massaging, hand expression, pumping, collection, storage, labeling, cleaning and handling of milk.  Encouraged mom to pump 8 or more times in 24 hours or about every 2 to 4 hours to stimulate breast to bring in mature milk and ensure a plentiful milk supply.  Mom has [employer] and has already received her [place] through her insurance.  Reviewed supply and demand and normal course of lactation.  Lactation name and number written on white board and encouraged mom to call with any questions, concerns or assistance.  Maternal Data    Feeding Feeding Type: Formula  LATCH Score                   Interventions    Lactation Tools Discussed/Used     Consult Status      [name] 04/07/2019, 5:01 PM

## 2019-04-08 ENCOUNTER — Encounter: Payer: Self-pay | Admitting: Certified Nurse Midwife

## 2019-04-08 LAB — SURGICAL PATHOLOGY

## 2019-04-08 NOTE — Lactation Note (Signed)
This note was copied from a baby's chart. Lactation Consultation Note  Patient Name: Angela Mccormick Date: 04/08/2019 Reason for consult: Follow-up assessment;Late-preterm 34-36.6wks;NICU baby  LC followed up with mom of baby in SCN. Mom has history of preterm delivery, first baby born at 30 weeks, and spent 4 months pumping breastmilk for baby. Mom has been set-up with DEBP in her room, educated on use, cleaning, and milk storage/transport. RN staff report inconsistency with pumping since set-up. Mom reports pumping 2x yesterday, and sleeping overnight without pumping. She reports pumping this morning, and receiving "drops". LC praised mom for her efforts, and provided education on the importance of frequent breast stimulation of 8-12x within 24 hours to encourage the onset of an adequate milk supply for baby. Mom acknowledges the information, but appears noncommittal at this time.  Encouraged her to call out for [facility] support as needed through her stay, and information given for outpatient support.   Maternal Data Has patient been taught Hand Expression?: Yes Does the patient have breastfeeding experience prior to this delivery?: Yes  Feeding Feeding Type: Formula  LATCH Score                   Interventions Interventions: Breast feeding basics reviewed;DEBP  Lactation Tools Discussed/Used Tools: Pump;40F feeding tube / Syringe   Consult Status Consult Status: PRN Date: 04/08/19 Follow-up type: Call as needed    Lavonia Drafts 04/08/2019, 11:55 AM

## 2019-04-08 NOTE — Progress Notes (Signed)
Post Partum Day 1: Repeat LTCS and BTL Subjective: voiding and tolerating PO. Feels bloated, but passing flatus. Baby in NICU and continues on O2 by nasal canula. Baby receiving formula via the NG tube. Mom starting to pump for breast milk.   Objective: Blood pressure 118/73, pulse 81, temperature (!) 97.5 F (36.4 C), temperature source Oral, resp. rate 18, height 5\' 3"  (1.6 m), weight 102.1 kg, last menstrual period 08/03/2018, SpO2 (!) 88 %, unknown if currently breastfeeding.  Physical Exam:  General: alert, cooperative and no distress  Heart: RRR without murmur Lungs: CTAB, normal respiratory effort Lochia: appropriate Uterine Fundus: firm/ slightly right of ML/ NT/ at U Abdomen: soft, but tympanic upper abdomen, Honeycomb dressing intact DVT Evaluation: No evidence of DVT seen on physical exam.  Recent Labs    04/06/19 2209 04/07/19 0531  HGB 11.7* 10.4*  HCT 35.0* 31.5*  WBC 8.3 11.6*  PLT 218 204    Assessment/Plan: Stable POD #1-continue postoperative/ postpartum care  Simethicone/ ambulate to help GI function Mild anemia;continue on prenatal vitamins with iron A POS/RI/VI Flu vaccine and TDAP UTD Breast and formula Contraception: BTL Probable discharge on POD #3    LOS: 2 days   Dalia Heading 04/08/2019, 9:41 AM

## 2019-04-08 NOTE — Lactation Note (Signed)
This note was copied from a baby's chart. Lactation Consultation Note  Patient Name: Angela Mccormick [id number] Date: 04/08/2019 Reason for consult: Follow-up assessment;Mother's request;Late-preterm 34-36.6wks;NICU baby  [facility] intern assisted mom with pumping. Changed flange size to 27 due to rubbing. Pumped for 15 minutes, no output. Applied coconut oil for soreness.  Mom expressed concern about making enough milk after self reported low supply with first baby. Mom did however state that she was able to store a good amount with previous child. Mom used nipple shield with first baby for the entire BF journey.  [facility] intern discussed with mom the importance of pumping every 2 hours to aid in adequate milk production and maintenance. Mom prefers every 3 hours instead. Encouraged mom to consistently pump every 2-3 hours to communicate to her body what baby needs.  LC name/number is on whiteboard, mom encouraged to call out with any questions or for assistance.    Maternal Data Has patient been taught Hand Expression?: Yes Does the patient have breastfeeding experience prior to this delivery?: Yes  Feeding Feeding Type: Formula  LATCH Score                   Interventions Interventions: DEBP;Coconut oil;Breast feeding basics reviewed  Lactation Tools Discussed/Used Tools: Pump;2F feeding tube / Syringe   Consult Status Consult Status: PRN Date: 04/08/19 Follow-up type: Call as needed    Lavonia Drafts 04/08/2019, 2:26 PM

## 2019-04-09 ENCOUNTER — Ambulatory Visit: Payer: BC Managed Care – PPO

## 2019-04-09 MED ORDER — SERTRALINE HCL 25 MG PO TABS
25.0000 mg | ORAL_TABLET | Freq: Every day | ORAL | Status: DC
  Administered 2019-04-09 – 2019-04-10 (×2): 25 mg via ORAL
  Filled 2019-04-09 (×2): qty 1

## 2019-04-09 NOTE — Plan of Care (Signed)
Vs stable; up ad lib; goes to SCN to see baby; uses breast pump; saw chaplain today; started zoloft today; taking percocet and motrin for pain control; probable discharge in a.m.

## 2019-04-09 NOTE — Discharge Instructions (Signed)

## 2019-04-09 NOTE — Plan of Care (Signed)
Vs stable; up ad lib; tolerating regular diet; taking motrin and percocet for pain control; goes to SCN to see baby; social work consult entered due to score on the [place] postnatal depression scale

## 2019-04-09 NOTE — Lactation Note (Signed)
This note was copied from a baby's chart. Lactation Consultation Note  Patient Name: Angela Mccormick [id number] Date: 04/09/2019 Reason for consult: Follow-up assessment  LC checked in with mom who remains in hospital recovering from c/s delivery at 35 weeks. [place] team worked with mom on pumping yesterday: education on pump set-up, frequency of pumping, and milk supply and demand.  [facility] intern assisted in pumping at around 1400 yesterday, and mom reports this morning not pumping again until 0300, but has pumped 2x so far. Mom does not significant pain on right breast/nipple area, there does appear to be a slight cut/abraison possibly due to use of smaller flange size.  LC provided a size 18mm breast flange for her right breast, and encouraged use of coconut oil to lubricate the skin and serve as a barrier between the skin and plastic. Reviewed to start on lower suction level, slowly increasing as tolerated.  LC offered to assist with this next pumping session; mom chooses to do it independently.  Maternal Data Formula Feeding for Exclusion: No Has patient been taught Hand Expression?: Yes Does the patient have breastfeeding experience prior to this delivery?: Yes  Feeding Feeding Type: Formula  LATCH Score                   Interventions Interventions: Breast feeding basics reviewed;DEBP;Coconut oil  Lactation Tools Discussed/Used Tools: 59F feeding tube / Syringe;Pump   Consult Status Consult Status: PRN Date: 04/09/19 Follow-up type: Call as needed    Lavonia Drafts 04/09/2019, 10:06 AM

## 2019-04-09 NOTE — Progress Notes (Signed)
   04/09/19 1400  Clinical Encounter Type  Visited With Patient  Visit Type Initial;Spiritual support  Referral From Nurse  Consult/Referral To Chaplain  Spiritual Encounters  Spiritual Needs Prayer;Emotional;Grief support  Stress Factors  Patient Stress Factors Family relationships;Loss;Major life changes  Family Stress Factors Loss;Major life changes  Chaplain was rounding and nurse referred patient to Chaplain. Nurse felt patient could have a release visiting with Chaplain. Chaplain made pastoral presence known with empathy, active listening, words of comfort and prayer. Chaplain help patient process other coping skills and possible ways to maintain supportive for her husband who has lost his family by tragedies. Patient was very open and thankful for the Chaplain's time.

## 2019-04-09 NOTE — Progress Notes (Signed)
Post Partum Day/ Postoperative Day #2 s/p repeat Cesarean section and BTL Subjective: Tearful, feeling depressed. Still grieving over the deaths of her father- in- law and mother- in- law and sister in law earlier this year. Baby still in NICU with respiratory issues. She last went to NICU yesterday afternoon. Nurses report that her EPDS was 14 last night. No suicidal ideation. Has seen therapist with her husband after the death of her in laws. Has a history of depression prior to the birth of her fist baby.  Tolerating regular diet. Passing flatus, feels less bloated. Voiding without difficulty. No lightheadedness when OOB. Objective: Blood pressure 108/78, pulse 80, temperature 98.1 F (36.7 C), temperature source Oral, resp. rate 18, height 5\' 3"  (1.6 m), weight 102.1 kg, last menstrual period 08/03/2018, SpO2 99 %, unknown if currently breastfeeding.  Physical Exam:  General: alert, cooperative and tearful.  Heart: RRR without murmur Lungs: CTAB Lochia: appropriate Uterine Fundus: firm/ U-1/ML/NT Abdomen: soft, bowel sounds active, honey comb dressing C+D+I DVT Evaluation: No evidence of DVT seen on physical exam.  Recent Labs    04/06/19 2209 04/07/19 0531  HGB 11.7* 10.4*  HCT 35.0* 31.5*  WBC 8.3 11.6*  PLT 218 204    Assessment/Plan: POD #2-stable. Continue postpartum/postoperative care Postpartum depression: Will start Zoloft 25 mgm daily x 3 days then increase to 50 mgm daily  Encouraged seeing therapist along with husband after discharge Breast/bottle (NG tube currently-feeding formula) A POS/RI/VI Contraception: BTL TDAP and flu vaccine given AP Probable discharge tomorrow.   Dalia Heading, CNM    LOS: 3 days   Dalia Heading 04/09/2019, 9:06 AM

## 2019-04-10 MED ORDER — SERTRALINE HCL 25 MG PO TABS: 25.0000 mg | ORAL_TABLET | Freq: Every day | ORAL | 2 refills | Status: DC

## 2019-04-10 MED ORDER — OXYCODONE HCL 5 MG PO TABS: 5.0000 mg | ORAL_TABLET | Freq: Four times a day (QID) | ORAL | 0 refills | Status: DC | PRN

## 2019-04-10 NOTE — Progress Notes (Signed)
No CSW coverage today. Pt with edinburgh score of 14. RN gave patient list of PMAD resources and educated patient on S&S of postpartum depression. Patient states that she feels okay, has already been seeing a therapist with her husband, and has been started on Zoloft. Patient has one week follow up. Patient encouraged to reach out to her OB if her mood changes.

## 2019-04-10 NOTE — Progress Notes (Signed)
Pt discharged with infant in SCN. Discharge instructions, prescriptions, and follow up appointments given to and reviewed with patient. Pt verbalized understanding. Escorted out by staff. 

## 2019-04-10 NOTE — Lactation Note (Signed)
This note was copied from a baby's chart. Lactation Consultation Note  Patient Name: Angela Mccormick [id number] Date: 04/10/2019 Reason for consult: Follow-up assessment;Late-preterm 34-36.6wks  Mom is being discharged home today. She reports pumping consistently yesterday every 2-2.5hours, and then in spans of 4-6hrs overnight, output approximately 35mL per pumping session at this time. LC discussed pumping post dischrage, mom has her own DEBP to use while she is at home, [place].  Breast flange for left breast increased today to size 41mm, mom reports rubbing last pumping session with the size 67mm. Size 76mm appears to be fitting well, and mom reports its more comfortable.  Encouraged consistency with pumping routine at home, visiting when possible, pumping bedside with baby if possible. Provided information for outpatient lactation services, [place] breastfeeding support availability, and continued support when visiting with baby in SCN. Mom had no questions/concerns.  Maternal Data Has patient been taught Hand Expression?: Yes Does the patient have breastfeeding experience prior to this delivery?: Yes  Feeding Feeding Type: Formula  LATCH Score                   Interventions Interventions: DEBP  Lactation Tools Discussed/Used Tools: 33F feeding tube / Syringe Pump Review: Setup, frequency, and cleaning;Milk Storage Date initiated:: 04/07/19   Consult Status Consult Status: PRN Date: 04/10/19 Follow-up type: Call as needed    Lavonia Drafts 04/10/2019, 9:19 AM

## 2019-04-14 ENCOUNTER — Ambulatory Visit (INDEPENDENT_AMBULATORY_CARE_PROVIDER_SITE_OTHER): Payer: BC Managed Care – PPO | Admitting: Obstetrics and Gynecology

## 2019-04-14 ENCOUNTER — Other Ambulatory Visit: Payer: Self-pay

## 2019-04-14 ENCOUNTER — Telehealth: Payer: Self-pay

## 2019-04-14 ENCOUNTER — Encounter: Payer: Self-pay | Admitting: Obstetrics and Gynecology

## 2019-04-14 VITALS — BP 118/66 | HR 70 | Wt 215.0 lb

## 2019-04-14 DIAGNOSIS — Z4889 Encounter for other specified surgical aftercare: Secondary | ICD-10-CM

## 2019-04-14 MED ORDER — OXYCODONE HCL 5 MG PO TABS: 5.0000 mg | ORAL_TABLET | ORAL | 0 refills | Status: AC | PRN

## 2019-04-14 MED ORDER — LIDOCAINE-PRILOCAINE 2.5-2.5 % EX CREA: 1.0000 "application " | TOPICAL_CREAM | CUTANEOUS | 0 refills | Status: DC | PRN

## 2019-04-14 NOTE — Telephone Encounter (Signed)
Patient called office and scheduled apt for today for incision check.

## 2019-04-14 NOTE — Progress Notes (Signed)
Postoperative Follow-up Patient presents post op from RLTCS & BTL 1weeks ago for PPROM and history of prior C-section.  Subjective: Patient reports some improvement in her preop symptoms. Eating a regular diet without difficulty. Is having problems with some fascial nerve pain right aspect of incision.  Activity: normal activities of daily living.  Objective: Blood pressure 118/66, pulse 70, weight 215 lb (97.5 kg), unknown if currently breastfeeding.  General: NAD Pulmonary: no increased work of breathing Abdomen: soft, non-tender, non-distended, incision(s) D/C/I Extremities: no edema Neurologic: normal gait    Admission on 04/06/2019, Discharged on 04/10/2019  Component Date Value Ref Range Status  . Rom Plus 04/06/2019 POSITIVE   Final   Performed at [facility], [address] [place], [place] [address]  . ABO/RH(D) 04/06/2019 A POS   Final  . Antibody Screen 04/06/2019 NEG   Final  . Sample Expiration 04/06/2019    Final                   Value:04/09/2019,2359 Performed at [facility], [address] [place], [place] [address]   . WBC 04/06/2019 8.3  4.0 - 10.5 K/uL Final  . RBC 04/06/2019 4.24  3.87 - 5.11 MIL/uL Final  . Hemoglobin 04/06/2019 11.7* 12.0 - 15.0 g/dL Final  . HCT 21/30/865711/22/2020 35.0* 36.0 - 46.0 % Final  . MCV 04/06/2019 82.5  80.0 - 100.0 fL Final  . MCH 04/06/2019 27.6  26.0 - 34.0 pg Final  . MCHC 04/06/2019 33.4  30.0 - 36.0 g/dL Final  . RDW 84/69/629511/22/2020 15.5  11.5 - 15.5 % Final  . Platelets 04/06/2019 218  150 - 400 K/uL Final  . nRBC 04/06/2019 0.0  0.0 - 0.2 % Final   Performed at [facility], [address] [place], [place] [address]  . RPR Ser Ql 04/06/2019 NON REACTIVE  NON REACTIVE Final   Performed at [facility], [address] [place], [place] [address]  . SARS Coronavirus 2 04/06/2019 NEGATIVE  NEGATIVE Final   Comment: (NOTE) If result is NEGATIVE SARS-CoV-2 target nucleic acids are NOT  DETECTED. The SARS-CoV-2 RNA is generally detectable in upper and lower  respiratory specimens during the acute phase of infection. The lowest  concentration of SARS-CoV-2 viral copies this assay can detect is 250  copies / mL. A negative result does not preclude SARS-CoV-2 infection  and should not be used as the sole basis for treatment or other  patient management decisions.  A negative result may occur with  improper specimen collection / handling, submission of specimen other  than nasopharyngeal swab, presence of viral mutation(s) within the  areas targeted by this assay, and inadequate number of viral copies  (<250 copies / mL). A negative result must be combined with clinical  observations, patient history, and epidemiological information. If result is POSITIVE SARS-CoV-2 target nucleic acids are DETECTED. The SARS-CoV-2 RNA is generally detectable in upper and lower  respiratory specimens dur                          ing the acute phase of infection.  Positive  results are indicative of active infection with SARS-CoV-2.  Clinical  correlation with patient history and other diagnostic information is  necessary to determine patient infection status.  Positive results do  not rule out bacterial infection or co-infection with other viruses. If result is PRESUMPTIVE POSTIVE SARS-CoV-2 nucleic acids MAY BE  PRESENT.   A presumptive positive result was obtained on the submitted specimen  and confirmed on repeat testing.  While 2019 novel coronavirus  (SARS-CoV-2) nucleic acids may be present in the submitted sample  additional confirmatory testing may be necessary for epidemiological  and / or clinical management purposes  to differentiate between  SARS-CoV-2 and other Sarbecovirus currently known to infect humans.  If clinically indicated additional testing with an alternate test  methodology [phone]) is advised. The SARS-CoV-2 RNA is generally  detectable in upper and lower  respiratory sp                          ecimens during the acute  phase of infection. The expected result is Negative. Fact Sheet for Patients:  StrictlyIdeas.no Fact Sheet for Healthcare Providers: BankingDealers.co.za This test is not yet approved or cleared by the [place] FDA and has been authorized for detection and/or diagnosis of SARS-CoV-2 by FDA under an Emergency Use Authorization (EUA).  This EUA will remain in effect (meaning this test can be used) for the duration of the COVID-19 declaration under Section 564(b)(1) of the Act, 21 U.S.C. section 360bbb-3(b)(1), unless the authorization is terminated or revoked sooner. Performed at [facility], [address], [place], [place] [address]   . Group B strep by PCR 04/06/2019 NEGATIVE  NEGATIVE Final   Comment: (NOTE) Intrapartum testing with Xpert GBS assay should be used as an adjunct to other methods available and not used to replace antepartum testing (at 35-[redacted] weeks gestation). Performed at [facility], [address], [place], [place] [address]   . SURGICAL PATHOLOGY 04/07/2019    Final-Edited                   Value:SURGICAL PATHOLOGY CASE: [id number] PATIENT: [facility] Surgical Pathology Report     Specimen Submitted: A. Fallopian tube segment, right B. Fallopian tube segment, left  Clinical History: Prior cesarean     DIAGNOSIS: A. FALLOPIAN TUBE, RIGHT; PARTIAL SALPINGECTOMY FOR STERILIZATION: - NO PATHOLOGIC CHANGES; COMPLETE CROSS SECTION OF LUMEN PRESENT.  B. FALLOPIAN TUBE, LEFT; PARTIAL SALPINGECTOMY FOR STERILIZATION: - NO PATHOLOGIC CHANGES; COMPLETE CROSS SECTION OF LUMEN PRESENT.  GROSS DESCRIPTION: A. Labeled: Right tube segment Received: Formalin Tissue fragment(s): 1 Size: 1.6 cm in length by 0.5 cm in diameter Description: Received is an irregular tubular fragment of tan-pink soft tissue (grossly  consistent with portion of fallopian tube).  Fimbria are absent.  No abnormalities are grossly identified.  Representative cross sections are submitted in cassette 1.  B. Labeled: Left tube segment Received: Formalin Tissue fragment(s): 1 Size                         : 1.1 cm in length by 0.6 cm in diameter Description: Received is an irregular tubular fragment of tan-pink soft tissue (grossly consistent with portion of fallopian tube).  Fimbria are absent.  No abnormalities are grossly identified.  Representative cross sections are submitted in cassette 1.   Final Diagnosis performed by Bryan Lemma, MD.   Electronically signed 04/08/2019 3:31:00PM The electronic signature indicates that the named Attending Pathologist has evaluated the specimen Technical component performed at [facility] [address] [place], [place] [address] Lab: [phone] Dir: Rush Farmer, MD, MMM  Professional component performed at [facility], [facility], [place], [place], [place] [address] Lab: [phone] Dir: Dellia Nims. Rubinas, MD   . WBC  04/07/2019 11.6* 4.0 - 10.5 K/uL Final  . RBC 04/07/2019 3.77* 3.87 - 5.11 MIL/uL Final  . Hemoglobin 04/07/2019 10.4* 12.0 - 15.0 g/dL Final  . HCT 96/28/3662 31.5* 36.0 - 46.0 % Final  . MCV 04/07/2019 83.6  80.0 - 100.0 fL Final  . MCH 04/07/2019 27.6  26.0 - 34.0 pg Final  . MCHC 04/07/2019 33.0  30.0 - 36.0 g/dL Final  . RDW 94/76/5465 15.5  11.5 - 15.5 % Final  . Platelets 04/07/2019 204  150 - 400 K/uL Final  . nRBC 04/07/2019 0.0  0.0 - 0.2 % Final   Performed at [facility], [address], [place], [place] [address]  . Sodium 04/07/2019 136  135 - 145 mmol/L Final  . Potassium 04/07/2019 4.0  3.5 - 5.1 mmol/L Final  . Chloride 04/07/2019 108  98 - 111 mmol/L Final  . CO2 04/07/2019 17* 22 - 32 mmol/L Final  . Glucose, Bld 04/07/2019 138* 70 - 99 mg/dL Final  . BUN 56/81/2751 6  6 - 20 mg/dL Final  . Creatinine, Ser  04/07/2019 0.40* 0.44 - 1.00 mg/dL Final  . Calcium 70/05/7492 8.7* 8.9 - 10.3 mg/dL Final  . GFR calc non Af Amer 04/07/2019 >60  >60 mL/min Final  . GFR calc Af Amer 04/07/2019 >60  >60 mL/min Final  . Anion gap 04/07/2019 11  5 - 15 Final   Performed at [facility], [address], [place], [place] [address]    Assessment: 33 y.o. s/p RLTCS & BTL stable  Plan: Patient has done well after surgery with no apparent complications.  I have discussed the post-operative course to date, and the expected progress moving forward.  The patient understands what complications to be concerned about.  I will see the patient in routine follow up, or sooner if needed.    Activity plan: No heavy lifting.  - Baby remains n NICU at present.  Has had some postpartum depression anxiety although a situational component is present.  Was started on Zoloft 25mg , will follow  - Rx percocet refill - Rx emla cream  Return in about 1 week (around 04/21/2019) for postop incision check.    14/11/2018, MD, Vena Austria OB/GYN, [facility] 04/14/2019, 8:46 PM

## 2019-04-14 NOTE — Telephone Encounter (Signed)
Pt had C-section 04/07/2019 states it is draining and it hurts to walk. Cb#[phone]

## 2019-04-17 ENCOUNTER — Ambulatory Visit: Payer: BC Managed Care – PPO | Admitting: Obstetrics and Gynecology

## 2019-04-18 ENCOUNTER — Encounter: Payer: BC Managed Care – PPO | Admitting: Obstetrics and Gynecology

## 2019-04-18 ENCOUNTER — Other Ambulatory Visit: Payer: BC Managed Care – PPO

## 2019-04-21 ENCOUNTER — Ambulatory Visit: Payer: Self-pay

## 2019-04-21 NOTE — Lactation Note (Signed)
This note was copied from a baby's chart. Lactation Consultation Note  Patient Name: Girl Lorinda Copland Today's Date: 04/21/2019   Raquel Sarna was born at 35.2 days gestation and  37.2 weeks CGA by repeat C/S.  She is on room air and in open crib.  She has mild RDS requiring BBO2 to maintain O2 Sats.  She is working on p.o. feedings.  Mom does not visit every day, but when she does come she brings breast milk.  She called today and voiced she would come tomorrow and bring breast milk.  When there is no breast milk, she is getting Similac Expert Care 22 calorie with slow flow nipple or tube/gavage 62 ml volume.  She is still requiring rest periods fatiguing quickly with bottle feedings.  Mom reports pumping every 2 to 2 1/2 hours during the day and 4 to 6 hours at night using [place] she got through her [employer].  Mom has a 22 1/2 year old that was born at 32 weeks and was in the NICU for 1 month.  Mom pumped for 4 months.    Maternal Data    Feeding Feeding Type: Formula Nipple Type: Slow - flow  LATCH Score                   Interventions    Lactation Tools Discussed/Used Tools: 40F feeding tube / Syringe;Pump   Consult Status      [name] 04/21/2019, 10:11 PM

## 2019-04-23 ENCOUNTER — Ambulatory Visit (INDEPENDENT_AMBULATORY_CARE_PROVIDER_SITE_OTHER): Payer: BC Managed Care – PPO | Admitting: Obstetrics and Gynecology

## 2019-04-23 ENCOUNTER — Other Ambulatory Visit: Payer: Self-pay

## 2019-04-23 ENCOUNTER — Encounter: Payer: Self-pay | Admitting: Obstetrics and Gynecology

## 2019-04-23 VITALS — BP 122/69 | Wt 210.0 lb

## 2019-04-23 DIAGNOSIS — Z4889 Encounter for other specified surgical aftercare: Secondary | ICD-10-CM

## 2019-04-23 MED ORDER — NYSTATIN 100000 UNIT/GM EX CREA: 1.0000 "application " | TOPICAL_CREAM | Freq: Two times a day (BID) | CUTANEOUS | 1 refills | Status: DC

## 2019-04-28 NOTE — Progress Notes (Signed)
Postoperative Follow-up Patient presents post op from RLTCS 2weeks ago for prior cesarean.  Subjective: Patient reports some improvement in her preop symptoms. Eating a regular diet without difficulty. Pain is controlled with current analgesics. Medications being used: ibuprofen/percocet.  Activity: normal activities of daily living.  Objective: Blood pressure 122/69, weight 210 lb (95.3 kg), unknown if currently breastfeeding.  General: NAD Pulmonary: no increased work of breathing Abdomen: soft, non-tender, non-distended, incision(s) D/C/I Extremities: no edema Neurologic: normal gait    Admission on 04/06/2019, Discharged on 04/10/2019  Component Date Value Ref Range Status  . Rom Plus 04/06/2019 POSITIVE   Final   Performed at [facility], [address] [place], [place] [address]  . ABO/RH(D) 04/06/2019 A POS   Final  . Antibody Screen 04/06/2019 NEG   Final  . Sample Expiration 04/06/2019    Final                   Value:04/09/2019,2359 Performed at [facility], [address] [place], [place] [address]   . WBC 04/06/2019 8.3  4.0 - 10.5 K/uL Final  . RBC 04/06/2019 4.24  3.87 - 5.11 MIL/uL Final  . Hemoglobin 04/06/2019 11.7* 12.0 - 15.0 g/dL Final  . HCT 40/34/742511/22/2020 35.0* 36.0 - 46.0 % Final  . MCV 04/06/2019 82.5  80.0 - 100.0 fL Final  . MCH 04/06/2019 27.6  26.0 - 34.0 pg Final  . MCHC 04/06/2019 33.4  30.0 - 36.0 g/dL Final  . RDW 95/63/875611/22/2020 15.5  11.5 - 15.5 % Final  . Platelets 04/06/2019 218  150 - 400 K/uL Final  . nRBC 04/06/2019 0.0  0.0 - 0.2 % Final   Performed at [facility], [address] [place], [place] [address]  . RPR Ser Ql 04/06/2019 NON REACTIVE  NON REACTIVE Final   Performed at [facility], [address] [place], [place] [address]  . SARS Coronavirus 2 04/06/2019 NEGATIVE  NEGATIVE Final   Comment: (NOTE) If result is NEGATIVE SARS-CoV-2 target nucleic acids are NOT DETECTED. The  SARS-CoV-2 RNA is generally detectable in upper and lower  respiratory specimens during the acute phase of infection. The lowest  concentration of SARS-CoV-2 viral copies this assay can detect is 250  copies / mL. A negative result does not preclude SARS-CoV-2 infection  and should not be used as the sole basis for treatment or other  patient management decisions.  A negative result may occur with  improper specimen collection / handling, submission of specimen other  than nasopharyngeal swab, presence of viral mutation(s) within the  areas targeted by this assay, and inadequate number of viral copies  (<250 copies / mL). A negative result must be combined with clinical  observations, patient history, and epidemiological information. If result is POSITIVE SARS-CoV-2 target nucleic acids are DETECTED. The SARS-CoV-2 RNA is generally detectable in upper and lower  respiratory specimens dur                          ing the acute phase of infection.  Positive  results are indicative of active infection with SARS-CoV-2.  Clinical  correlation with patient history and other diagnostic information is  necessary to determine patient infection status.  Positive results do  not rule out bacterial infection or co-infection with other viruses. If result is PRESUMPTIVE POSTIVE SARS-CoV-2 nucleic acids MAY BE PRESENT.   A presumptive positive result was obtained on  the submitted specimen  and confirmed on repeat testing.  While 2019 novel coronavirus  (SARS-CoV-2) nucleic acids may be present in the submitted sample  additional confirmatory testing may be necessary for epidemiological  and / or clinical management purposes  to differentiate between  SARS-CoV-2 and other Sarbecovirus currently known to infect humans.  If clinically indicated additional testing with an alternate test  methodology [phone]) is advised. The SARS-CoV-2 RNA is generally  detectable in upper and lower respiratory sp                           ecimens during the acute  phase of infection. The expected result is Negative. Fact Sheet for Patients:  StrictlyIdeas.no Fact Sheet for Healthcare Providers: BankingDealers.co.za This test is not yet approved or cleared by the [place] FDA and has been authorized for detection and/or diagnosis of SARS-CoV-2 by FDA under an Emergency Use Authorization (EUA).  This EUA will remain in effect (meaning this test can be used) for the duration of the COVID-19 declaration under Section 564(b)(1) of the Act, 21 U.S.C. section 360bbb-3(b)(1), unless the authorization is terminated or revoked sooner. Performed at [facility], [address], [place], [place] [address]   . Group B strep by PCR 04/06/2019 NEGATIVE  NEGATIVE Final   Comment: (NOTE) Intrapartum testing with Xpert GBS assay should be used as an adjunct to other methods available and not used to replace antepartum testing (at 35-[redacted] weeks gestation). Performed at [facility], [address], [place], [place] [address]   . SURGICAL PATHOLOGY 04/07/2019    Final-Edited                   Value:SURGICAL PATHOLOGY CASE: [id number] PATIENT: [facility] Surgical Pathology Report     Specimen Submitted: A. Fallopian tube segment, right B. Fallopian tube segment, left  Clinical History: Prior cesarean     DIAGNOSIS: A. FALLOPIAN TUBE, RIGHT; PARTIAL SALPINGECTOMY FOR STERILIZATION: - NO PATHOLOGIC CHANGES; COMPLETE CROSS SECTION OF LUMEN PRESENT.  B. FALLOPIAN TUBE, LEFT; PARTIAL SALPINGECTOMY FOR STERILIZATION: - NO PATHOLOGIC CHANGES; COMPLETE CROSS SECTION OF LUMEN PRESENT.  GROSS DESCRIPTION: A. Labeled: Right tube segment Received: Formalin Tissue fragment(s): 1 Size: 1.6 cm in length by 0.5 cm in diameter Description: Received is an irregular tubular fragment of tan-pink soft tissue (grossly consistent with portion of  fallopian tube).  Fimbria are absent.  No abnormalities are grossly identified.  Representative cross sections are submitted in cassette 1.  B. Labeled: Left tube segment Received: Formalin Tissue fragment(s): 1 Size                         : 1.1 cm in length by 0.6 cm in diameter Description: Received is an irregular tubular fragment of tan-pink soft tissue (grossly consistent with portion of fallopian tube).  Fimbria are absent.  No abnormalities are grossly identified.  Representative cross sections are submitted in cassette 1.   Final Diagnosis performed by Bryan Lemma, MD.   Electronically signed 04/08/2019 3:31:00PM The electronic signature indicates that the named Attending Pathologist has evaluated the specimen Technical component performed at [facility], [address] [place], [place] [address] Lab: [phone] Dir: Rush Farmer, MD, MMM  Professional component performed at [facility], [facility], [place], [place], [place] [address] Lab: [phone] Dir: Dellia Nims. Rubinas, MD   . WBC 04/07/2019 11.6* 4.0 - 10.5 K/uL Final  . RBC  04/07/2019 3.77* 3.87 - 5.11 MIL/uL Final  . Hemoglobin 04/07/2019 10.4* 12.0 - 15.0 g/dL Final  . HCT 19/62/2297 31.5* 36.0 - 46.0 % Final  . MCV 04/07/2019 83.6  80.0 - 100.0 fL Final  . MCH 04/07/2019 27.6  26.0 - 34.0 pg Final  . MCHC 04/07/2019 33.0  30.0 - 36.0 g/dL Final  . RDW 98/92/1194 15.5  11.5 - 15.5 % Final  . Platelets 04/07/2019 204  150 - 400 K/uL Final  . nRBC 04/07/2019 0.0  0.0 - 0.2 % Final   Performed at [facility], [address], [place], [place] [address]  . Sodium 04/07/2019 136  135 - 145 mmol/L Final  . Potassium 04/07/2019 4.0  3.5 - 5.1 mmol/L Final  . Chloride 04/07/2019 108  98 - 111 mmol/L Final  . CO2 04/07/2019 17* 22 - 32 mmol/L Final  . Glucose, Bld 04/07/2019 138* 70 - 99 mg/dL Final  . BUN 14/48/1856 6  6 - 20 mg/dL Final  . Creatinine, Ser 04/07/2019 0.40* 0.44 -  1.00 mg/dL Final  . Calcium 31/49/7026 8.7* 8.9 - 10.3 mg/dL Final  . GFR calc non Af Amer 04/07/2019 >60  >60 mL/min Final  . GFR calc Af Amer 04/07/2019 >60  >60 mL/min Final  . Anion gap 04/07/2019 11  5 - 15 Final   Performed at [facility], [address], [place], [place] [address]    Assessment: 33 y.o. s/p RLTCS stable  Plan: Patient has done well after surgery with no apparent complications.  I have discussed the post-operative course to date, and the expected progress moving forward.  The patient understands what complications to be concerned about.  I will see the patient in routine follow up, or sooner if needed.    Activity plan: No heavy lifting   Vena Austria, MD, Merlinda Frederick OB/GYN, [facility] 04/28/2019, 11:23 AM

## 2019-04-30 ENCOUNTER — Encounter: Payer: BC Managed Care – PPO | Admitting: Obstetrics & Gynecology

## 2019-05-01 ENCOUNTER — Other Ambulatory Visit: Payer: BC Managed Care – PPO

## 2019-05-02 ENCOUNTER — Other Ambulatory Visit: Payer: BC Managed Care – PPO

## 2019-05-06 ENCOUNTER — Inpatient Hospital Stay: Admit: 2019-05-06 | Payer: BC Managed Care – PPO | Admitting: Obstetrics & Gynecology

## 2019-05-06 SURGERY — Surgical Case
Anesthesia: Choice

## 2019-05-12 ENCOUNTER — Other Ambulatory Visit: Payer: Self-pay | Admitting: Obstetrics and Gynecology

## 2019-05-12 MED ORDER — AMOXICILLIN-POT CLAVULANATE 875-125 MG PO TABS: 1.0000 | ORAL_TABLET | Freq: Two times a day (BID) | ORAL | 0 refills | Status: AC

## 2019-05-12 NOTE — Telephone Encounter (Signed)
Patient is schedule for 05/21/19 at [facility] location advised patient I have added her to cancellation list if an appointment comes open

## 2019-05-12 NOTE — Telephone Encounter (Signed)
Needs postop appointment next week

## 2019-05-21 ENCOUNTER — Other Ambulatory Visit: Payer: Self-pay

## 2019-05-21 ENCOUNTER — Encounter: Payer: Self-pay | Admitting: Obstetrics and Gynecology

## 2019-05-21 ENCOUNTER — Ambulatory Visit (INDEPENDENT_AMBULATORY_CARE_PROVIDER_SITE_OTHER): Payer: BC Managed Care – PPO | Admitting: Obstetrics and Gynecology

## 2019-05-21 DIAGNOSIS — Z1389 Encounter for screening for other disorder: Secondary | ICD-10-CM | POA: Diagnosis not present

## 2019-05-21 DIAGNOSIS — F53 Postpartum depression: Secondary | ICD-10-CM

## 2019-05-21 DIAGNOSIS — Z4889 Encounter for other specified surgical aftercare: Secondary | ICD-10-CM

## 2019-05-21 DIAGNOSIS — O99345 Other mental disorders complicating the puerperium: Secondary | ICD-10-CM

## 2019-05-21 MED ORDER — SERTRALINE HCL 50 MG PO TABS: 50.0000 mg | ORAL_TABLET | Freq: Every day | ORAL | 2 refills | Status: DC

## 2019-05-21 MED ORDER — SERTRALINE HCL 50 MG PO TABS: 25.0000 mg | ORAL_TABLET | Freq: Every day | ORAL | 2 refills | Status: DC

## 2019-05-21 NOTE — Progress Notes (Signed)
Postpartum Visit  Chief Complaint:  Chief Complaint  Patient presents with  . Postpartum Care    History of Present Illness: Patient is a 34 y.o. [id number] presents for postpartum visit.  Date of delivery:  Cesarean Section:04/07/2019 Pregnancy or labor problems:  no Any problems since the delivery:  no  Newborn Details:  SINGLETON :  1. BabyGender female. Birth weight: 6lbs 7.4oz Maternal Details:  Breast or formula feeding: plans to breastfeed Intercourse: No  Contraception after delivery: No  Any bowel or bladder issues: No  Post partum depression/anxiety noted:  no Edinburgh Post-Partum Depression Score:13 Date of last PAP: 06/19/17  no abnormalities   Review of Systems: Review of Systems  Constitutional: Negative.   Gastrointestinal: Negative.   Genitourinary: Negative.   Skin: Positive for rash. Negative for itching.  Psychiatric/Behavioral: Negative.     The following portions of the patient's history were reviewed and updated as appropriate: allergies, current medications, past family history, past medical history, past social history, past surgical history and problem list.  Past Medical History:  Past Medical History:  Diagnosis Date  . Asthma   . Depression   . Gestational diabetes   . History of Chiari malformation   . History of syncope   . Migraine     Past Surgical History:  Past Surgical History:  Procedure Laterality Date  . CESAREAN SECTION N/A 12/17/2017   Procedure: CESAREAN SECTION;  Surgeon: Vena Austria, MD;  Location: [facility];  Service: Obstetrics;  Laterality: N/A;  . CESAREAN SECTION WITH BILATERAL TUBAL LIGATION  04/06/2019   Procedure: CESAREAN SECTION WITH BILATERAL TUBAL LIGATION;  Surgeon: Vena Austria, MD;  Location: [facility];  Service: Obstetrics;;  . Wenda Low SIGMOIDOSCOPY N/A 03/12/2018   Procedure: FLEXIBLE SIGMOIDOSCOPY;  Surgeon: Toney Reil, MD;  Location: [facility];  Service: Gastroenterology;   Laterality: N/A;  . NO PAST SURGERIES    . WISDOM TOOTH EXTRACTION      Family History:  Family History  Problem Relation Age of Onset  . Colon cancer Mother   . Heart disease Mother   . Diabetes Mother   . Stroke Mother   . Diabetes Father     Social History:  Social History   Socioeconomic History  . Marital status: Married    Spouse name: Not on file  . Number of children: Not on file  . Years of education: Not on file  . Highest education level: Not on file  Occupational History  . Not on file  Tobacco Use  . Smoking status: Never Smoker  . Smokeless tobacco: Never Used  Substance and Sexual Activity  . Alcohol use: No  . Drug use: No  . Sexual activity: Yes    Partners: Male    Birth control/protection: None  Other Topics Concern  . Not on file  Social History Narrative  . Not on file   Social Determinants of Health   Financial Resource Strain:   . Difficulty of Paying Living Expenses: Not on file  Food Insecurity:   . Worried About Programme researcher, broadcasting/film/video in the Last Year: Not on file  . Ran Out of Food in the Last Year: Not on file  Transportation Needs:   . Lack of Transportation (Medical): Not on file  . Lack of Transportation (Non-Medical): Not on file  Physical Activity:   . Days of Exercise per Week: Not on file  . Minutes of Exercise per Session: Not on file  Stress:   .  Feeling of Stress : Not on file  Social Connections:   . Frequency of Communication with Friends and Family: Not on file  . Frequency of Social Gatherings with Friends and Family: Not on file  . Attends Religious Services: Not on file  . Active Member of Clubs or Organizations: Not on file  . Attends Banker Meetings: Not on file  . Marital Status: Not on file  Intimate Partner Violence:   . Fear of Current or Ex-Partner: Not on file  . Emotionally Abused: Not on file  . Physically Abused: Not on file  . Sexually Abused: Not on file    Allergies:  Allergies    Allergen Reactions  . Sumatriptan Succinate Anxiety  . Other     dissolvable staples - body rejects    Medications: Prior to Admission medications   Medication Sig Start Date End Date Taking? Authorizing Provider  amoxicillin-clavulanate (AUGMENTIN) 875-125 MG tablet Take 1 tablet by mouth 2 (two) times daily for 10 days. 05/12/19 05/22/19 Yes Vena Austria, MD  lidocaine-prilocaine (EMLA) cream Apply 1 application topically as needed. 04/14/19  Yes Vena Austria, MD  nystatin cream (MYCOSTATIN) Apply 1 application topically 2 (two) times daily. 04/23/19  Yes Vena Austria, MD  prenatal vitamin w/FE, FA (NATACHEW) 29-1 MG CHEW chewable tablet Chew 1 tablet by mouth daily at 12 noon.   Yes [provider]  sertraline (ZOLOFT) 50 MG tablet Take 0.5 tablets (25 mg total) by mouth daily. 05/21/19  Yes Vena Austria, MD  albuterol (PROVENTIL HFA;VENTOLIN HFA) 108 (90 Base) MCG/ACT inhaler Inhale 2 puffs into the lungs every 6 (six) hours as needed for wheezing or shortness of breath. Patient not taking: Reported on 05/21/2019 03/04/18   Glori Luis, MD    Physical Exam Blood pressure 115/66, height 5\' 3"  (1.6 m), weight 209 lb (94.8 kg), currently breastfeeding.  General: NAD HEENT: normocephalic, anicteric Pulmonary: No increased work of breathing Abdomen: NABS, soft, non-tender, non-distended.  Umbilicus without lesions.  No hepatomegaly, splenomegaly or masses palpable. No evidence of hernia. Incision has a few area of erosion in incision line consistent with stitch abscesses, no drainage currently Genitourinary:  External: Normal external female genitalia.  Normal urethral meatus, normal  Bartholin's and Skene's glands.    Vagina: Normal vaginal mucosa, no evidence of prolapse.    Cervix: Grossly normal in appearance, no bleeding  Uterus: Non-enlarged, mobile, normal contour.  No CMT  Adnexa: ovaries non-enlarged, no adnexal masses  Rectal:  deferred Extremities: no edema, erythema, or tenderness Neurologic: Grossly intact Psychiatric: mood appropriate, affect full    Edinburgh Postnatal Depression Scale - 05/21/19 1543      Edinburgh Postnatal Depression Scale:  In the Past 7 Days   I have been able to laugh and see the funny side of things.  1    I have looked forward with enjoyment to things.  1    I have blamed myself unnecessarily when things went wrong.  2    I have been anxious or worried for no good reason.  2    I have felt scared or panicky for no good reason.  1    Things have been getting on top of me.  2    I have been so unhappy that I have had difficulty sleeping.  1    I have felt sad or miserable.  2    I have been so unhappy that I have been crying.  1    The  thought of harming myself has occurred to me.  0    Edinburgh Postnatal Depression Scale Total  13        Assessment: 33 y.o. G2P0202 presenting for 6 week postpartum visit  Plan: Problem List Items Addressed This Visit    None    Visit Diagnoses    6 weeks postpartum follow-up    -  Primary   Postoperative visit       Postpartum depression       Relevant Medications   sertraline (ZOLOFT) 50 MG tablet      1) Contraception - Education given regarding options for contraception, as well as compatibility with breast feeding if applicable.  Patient plans on tubal ligation for contraception.  2)  Pap - ASCCP guidelines and rational discussed.  ASCCP guidelines and rational discussed.  Patient opts for every 3 years screening interval  3) Patient underwent screening for postpartum depression.  Doing well on Zoloft 25mg  daily, EPDS of 13 increase to 50mg  daily  4) Return in about 4 weeks (around 06/18/2019) for medication follow up phone.   [name], MD, [facility], [facility] 05/21/2019, 3:41 PM

## 2019-05-26 ENCOUNTER — Other Ambulatory Visit: Payer: Self-pay | Admitting: Obstetrics and Gynecology

## 2019-05-26 MED ORDER — SULFAMETHOXAZOLE-TRIMETHOPRIM 800-160 MG PO TABS: 1.0000 | ORAL_TABLET | Freq: Two times a day (BID) | ORAL | 1 refills | Status: DC

## 2019-05-29 ENCOUNTER — Other Ambulatory Visit: Payer: Self-pay | Admitting: Obstetrics and Gynecology

## 2019-05-29 ENCOUNTER — Telehealth: Payer: Self-pay | Admitting: Obstetrics and Gynecology

## 2019-05-29 DIAGNOSIS — N920 Excessive and frequent menstruation with regular cycle: Secondary | ICD-10-CM

## 2019-05-29 MED ORDER — MEDROXYPROGESTERONE ACETATE 10 MG PO TABS: 20.0000 mg | ORAL_TABLET | Freq: Two times a day (BID) | ORAL | 0 refills | Status: DC

## 2019-05-29 NOTE — Telephone Encounter (Signed)
Spoke w patient. Her period started yesterday for the first time. It is very heavy at times. They're normally not that heavy. Yesterday, her blood was running down her leg. She has not passed clots.   She denies abdominal pain. She felt lightheaded when the blood was running down her leg.  She does not have a reaction to seeing blood. She is using a BTL for contraception.   The patient is breast and formula feeding.  She is concerned something will happen overnight. I let her know that I would have a limited ability to understand or investigate why she's bleeding so heavily just through a phone evaluation. I suggested that if it continued to call the after hours line or try to get an appointment for tomorrow. If it gets really bad, then she would need to go to the ER. Though, I would like to try to avoid that. Will call in provera to see if it can temporize her bleeding until she can be evaluated, if it doesn't resolve on its own.

## 2019-06-17 ENCOUNTER — Encounter: Payer: Self-pay | Admitting: Family Medicine

## 2019-06-18 ENCOUNTER — Telehealth: Payer: Self-pay | Admitting: Obstetrics and Gynecology

## 2019-06-18 ENCOUNTER — Telehealth: Payer: Self-pay

## 2019-06-18 ENCOUNTER — Other Ambulatory Visit: Payer: Self-pay

## 2019-06-18 ENCOUNTER — Ambulatory Visit
Admission: EM | Admit: 2019-06-18 | Discharge: 2019-06-18 | Disposition: A | Discharge status: Home / Self Care | Payer: BC Managed Care – PPO | Attending: Emergency Medicine | Admitting: Emergency Medicine

## 2019-06-18 DIAGNOSIS — W260XXA Contact with knife, initial encounter: Secondary | ICD-10-CM

## 2019-06-18 DIAGNOSIS — S61211A Laceration without foreign body of left index finger without damage to nail, initial encounter: Secondary | ICD-10-CM | POA: Diagnosis not present

## 2019-06-18 MED ORDER — CEPHALEXIN 500 MG PO CAPS: 500.0000 mg | ORAL_CAPSULE | Freq: Three times a day (TID) | ORAL | 0 refills | Status: AC

## 2019-06-18 NOTE — Discharge Instructions (Addendum)
Take the antibiotic as directed.    Keep your wound clean and dry.  Wash it gently twice a day with soap and water.  Apply an antibiotic cream twice a day.    Return here if you see signs of infection, such as increased pain, redness, pus-like drainage, warmth, fever, chills, or other concerning symptoms.    

## 2019-06-18 NOTE — ED Provider Notes (Signed)
Renaldo Fiddler    CSN: [id number] Arrival date & time: 06/18/19  1150      History   Chief Complaint Chief Complaint  Patient presents with  . Laceration    HPI Angela Mccormick is a 34 y.o. female.   Patient presents with a laceration to her left index finger.  She states she cut it with a knife on 06/15/2019.  She is concerned because the area is now red and painful.  She denies fever or chills.  She denies drainage from the wound or streaks.  Last tetanus October 2020.    The history is provided by the patient.    Past Medical History:  Diagnosis Date  . Asthma   . Depression   . Gestational diabetes   . History of Chiari malformation   . History of syncope   . Migraine     Patient Active Problem List   Diagnosis Date Noted  . Encounter for care and examination of lactating mother 04/10/2019  . Preterm premature rupture of membranes 04/06/2019  . History of gestational diabetes 03/14/2019  . History of cesarean section 11/28/2018  . History of preterm delivery, currently pregnant 11/06/2018  . Short interval between pregnancies affecting pregnancy, antepartum 09/11/2018  . Otitis media 04/27/2018  . Routine general medical examination at a health care facility 03/04/2018  . Rectal bleeding 03/04/2018  . Postpartum care following cesarean delivery 12/20/2017  . Supervision of high risk pregnancy, antepartum 06/19/2017  . Obesity affecting pregnancy, antepartum 06/19/2017  . Nodule of neck 11/01/2016  . Chiari malformation (HCC) 09/26/2012  . Migraines 09/26/2012  . Syncope 09/26/2012    Past Surgical History:  Procedure Laterality Date  . CESAREAN SECTION N/A 12/17/2017   Procedure: CESAREAN SECTION;  Surgeon: Vena Austria, MD;  Location: [facility];  Service: Obstetrics;  Laterality: N/A;  . CESAREAN SECTION WITH BILATERAL TUBAL LIGATION  04/06/2019   Procedure: CESAREAN SECTION WITH BILATERAL TUBAL LIGATION;  Surgeon: Vena Austria, MD;   Location: [facility];  Service: Obstetrics;;  . Wenda Low SIGMOIDOSCOPY N/A 03/12/2018   Procedure: FLEXIBLE SIGMOIDOSCOPY;  Surgeon: Toney Reil, MD;  Location: [facility];  Service: Gastroenterology;  Laterality: N/A;  . NO PAST SURGERIES    . WISDOM TOOTH EXTRACTION      OB History    Gravida  2   Para  2   Term  0   Preterm  2   AB      Living  2     SAB      TAB      Ectopic      Multiple  0   Live Births  2            Home Medications    Prior to Admission medications   Medication Sig Start Date End Date Taking? Authorizing Provider  albuterol (PROVENTIL HFA;VENTOLIN HFA) 108 (90 Base) MCG/ACT inhaler Inhale 2 puffs into the lungs every 6 (six) hours as needed for wheezing or shortness of breath. 03/04/18  Yes Glori Luis, MD  prenatal vitamin w/FE, FA (NATACHEW) 29-1 MG CHEW chewable tablet Chew 1 tablet by mouth daily at 12 noon.   Yes [provider]  sertraline (ZOLOFT) 50 MG tablet Take 1 tablet (50 mg total) by mouth daily. 05/21/19  Yes Vena Austria, MD  cephALEXin (KEFLEX) 500 MG capsule Take 1 capsule (500 mg total) by mouth 3 (three) times daily for 7 days. 06/18/19 06/25/19  Mickie Bail, NP  lidocaine-prilocaine (  EMLA) cream Apply 1 application topically as needed. 04/14/19   [name], MD  medroxyPROGESTERone (PROVERA) 10 MG tablet Take 2 tablets (20 mg total) by mouth 2 (two) times daily for 5 days. 05/29/19 06/03/19  Will Bonnet, MD  nystatin cream (MYCOSTATIN) Apply 1 application topically 2 (two) times daily. 04/23/19   [name], MD  sulfamethoxazole-trimethoprim (BACTRIM DS) 800-160 MG tablet Take 1 tablet by mouth 2 (two) times daily. 05/26/19   [name], MD    Family History Family History  Problem Relation Age of Onset  . Colon cancer Mother   . Heart disease Mother   . Diabetes Mother   . Stroke Mother   . Diabetes Father     Social History Social History   Tobacco Use    . Smoking status: Never Smoker  . Smokeless tobacco: Never Used  Substance Use Topics  . Alcohol use: No  . Drug use: No     Allergies   Sumatriptan succinate and Other   Review of Systems Review of Systems  Constitutional: Negative for chills and fever.  HENT: Negative for ear pain and sore throat.   Eyes: Negative for pain and visual disturbance.  Respiratory: Negative for cough and shortness of breath.   Cardiovascular: Negative for chest pain and palpitations.  Gastrointestinal: Negative for abdominal pain and vomiting.  Genitourinary: Negative for dysuria and hematuria.  Musculoskeletal: Negative for arthralgias and back pain.  Skin: Positive for wound. Negative for color change and rash.  Neurological: Negative for seizures and syncope.  All other systems reviewed and are negative.    Physical Exam Triage Vital Signs ED Triage Vitals  Enc Vitals Group     BP 06/18/19 1203 115/68     Pulse Rate 06/18/19 1203 87     Resp 06/18/19 1203 15     Temp 06/18/19 1203 97.9 F (36.6 C)     Temp Source 06/18/19 1203 Oral     SpO2 06/18/19 1203 98 %     Weight 06/18/19 1158 205 lb (93 kg)     Height 06/18/19 1158 5\' 3"  (1.6 m)     Head Circumference --      Peak Flow --      Pain Score 06/18/19 1158 5     Pain Loc --      Pain Edu? --      Excl. in [place]? --    No data found.  Updated Vital Signs BP 115/68 (BP Location: Left Arm)   Pulse 87   Temp 97.9 F (36.6 C) (Oral)   Resp 15   Ht 5\' 3"  (1.6 m)   Wt 205 lb (93 kg)   SpO2 98%   Breastfeeding Yes   BMI 36.31 kg/m   Visual Acuity Right Eye Distance:   Left Eye Distance:   Bilateral Distance:    Right Eye Near:   Left Eye Near:    Bilateral Near:     Physical Exam Vitals and nursing note reviewed.  Constitutional:      General: She is not in acute distress.    Appearance: She is well-developed.  HENT:     Head: Normocephalic and atraumatic.     Mouth/Throat:     Mouth: Mucous membranes are  moist.     Pharynx: Oropharynx is clear.  Eyes:     Conjunctiva/sclera: Conjunctivae normal.  Cardiovascular:     Rate and Rhythm: Normal rate and regular rhythm.     Heart sounds: No murmur.  Pulmonary:     Effort: Pulmonary effort is normal. No respiratory distress.     Breath sounds: Normal breath sounds.  Abdominal:     Palpations: Abdomen is soft.     Tenderness: There is no abdominal tenderness. There is no guarding or rebound.  Musculoskeletal:        General: Swelling and tenderness present. No deformity. Normal range of motion.     Cervical back: Neck supple.  Skin:    General: Skin is warm and dry.     Findings: Lesion present.     Comments: 1 cm closed laceration on left index finger; no active bleeding or drainage; erythematous and edematous from DIP to tip; no streaks; tender to palpation; sensation intact; 5/5 strength.    Neurological:     General: No focal deficit present.     Mental Status: She is alert and oriented to person, place, and time.     Sensory: No sensory deficit.     Motor: No weakness.  Psychiatric:        Mood and Affect: Mood normal.        Behavior: Behavior normal.      UC Treatments / Results  Labs (all labs ordered are listed, but only abnormal results are displayed) Labs Reviewed - No data to display  EKG   Radiology No results found.  Procedures Procedures (including critical care time)  Medications Ordered in UC Medications - No data to display  Initial Impression / Assessment and Plan / UC Course  I have reviewed the triage vital signs and the nursing notes.  Pertinent labs & imaging results that were available during my care of the patient were reviewed by me and considered in my medical decision making (see chart for details).    Laceration of left index finger.  Tetanus up-to-date.  Treating with Keflex.  Wound care instructions and signs of infection discussed with patient.  Instructed her to return here if she notes  signs of worsening infection.  Patient agrees to plan of care.     Final Clinical Impressions(s) / UC Diagnoses   Final diagnoses:  Laceration of left index finger without foreign body without damage to nail, initial encounter     Discharge Instructions     Take the antibiotic as directed.    Keep your wound clean and dry.  Wash it gently twice a day with soap and water.  Apply an antibiotic cream twice a day.    Return here if you see signs of infection, such as increased pain, redness, pus-like drainage, warmth, fever, chills, or other concerning symptoms.       ED Prescriptions    Medication Sig Dispense Auth. Provider   cephALEXin (KEFLEX) 500 MG capsule Take 1 capsule (500 mg total) by mouth 3 (three) times daily for 7 days. 21 capsule Mickie Bail, NP     PDMP not reviewed this encounter.   Mickie Bail, NP 06/18/19 [phone]

## 2019-06-18 NOTE — Telephone Encounter (Signed)
Per Dr. Birdie Sons I called the patient about the cut on her finger and informed her that she would need to go to the urgent care to have her finger looked at.  She agreed and understood.  Kamerin Axford,cma

## 2019-06-18 NOTE — Telephone Encounter (Signed)
Sch appt AMS tomorrow

## 2019-06-18 NOTE — Telephone Encounter (Signed)
Please call her and tell her to go to urgent care. She may need stitches and this needs to be seen in person.

## 2019-06-18 NOTE — ED Triage Notes (Signed)
Patient states that she cut her left index finger with a knife on Sunday. States that her PCP told her to come here for evaluation.

## 2019-06-18 NOTE — Telephone Encounter (Signed)
Called and left voicemail for patient to call back to be seen today for incision check brown drainage at site per [facility].

## 2019-06-18 NOTE — Telephone Encounter (Signed)
Called and left voice mail for patient to call back to be schedule °

## 2019-06-19 ENCOUNTER — Ambulatory Visit: Payer: BC Managed Care – PPO | Admitting: Obstetrics and Gynecology

## 2019-06-19 ENCOUNTER — Other Ambulatory Visit: Payer: Self-pay

## 2019-06-19 ENCOUNTER — Ambulatory Visit (INDEPENDENT_AMBULATORY_CARE_PROVIDER_SITE_OTHER): Payer: BC Managed Care – PPO | Admitting: Obstetrics and Gynecology

## 2019-06-19 ENCOUNTER — Encounter: Payer: Self-pay | Admitting: Obstetrics and Gynecology

## 2019-06-19 VITALS — BP 124/79 | Wt 209.0 lb

## 2019-06-19 DIAGNOSIS — B354 Tinea corporis: Secondary | ICD-10-CM | POA: Diagnosis not present

## 2019-06-19 DIAGNOSIS — N760 Acute vaginitis: Secondary | ICD-10-CM

## 2019-06-19 DIAGNOSIS — N938 Other specified abnormal uterine and vaginal bleeding: Secondary | ICD-10-CM

## 2019-06-19 DIAGNOSIS — N939 Abnormal uterine and vaginal bleeding, unspecified: Secondary | ICD-10-CM

## 2019-06-19 MED ORDER — NYSTATIN 100000 UNIT/GM EX CREA: 1.0000 "application " | TOPICAL_CREAM | Freq: Two times a day (BID) | CUTANEOUS | 1 refills | Status: DC

## 2019-06-19 NOTE — Progress Notes (Signed)
Obstetrics & Gynecology Office Visit   Chief Complaint:  Chief Complaint  Patient presents with  . Post op    Vaginal discharge, incision check    History of Present Illness: 34 y.o. [id number] presenting for concerns regarding incision. She noted brown discharge from the incision.  She has had a complicated history postoperatively with her first incision she developed a reaction the the insorb stable and devloped several small abscess collections.  This time subcuticular monocryl was used but she still developed a stitch abscess was placed on antibiotic course and has noted improvement until now.  No fevers, no chills.  Brown discharge right aspect on incision.  No pain.  Husband is still putting hydrogen peroxide on the incision to clean the area.  Also she reported heavy menses on 05/29/2019 with repeat episode of bleeding a week later.  No bleeding since.  This was her first menstrual cycle since delivery.  She is currently s/p BTL at the time of C-section for contraception and not any any hormonal contraception.  She was placed on provera by Dr. Glennon Mac and the second menses may have been withdrawal bleed from the provera.  Also concerned about change in vaginal discharge.   Review of Systems: Review of Systems  Constitutional: Negative.   Gastrointestinal: Negative for abdominal pain.  Genitourinary: Negative.   Skin: Positive for rash. Negative for itching.     Past Medical History:  Past Medical History:  Diagnosis Date  . Asthma   . Depression   . Gestational diabetes   . History of Chiari malformation   . History of syncope   . Migraine     Past Surgical History:  Past Surgical History:  Procedure Laterality Date  . CESAREAN SECTION N/A 12/17/2017   Procedure: CESAREAN SECTION;  Surgeon: [name], MD;  Location: [facility];  Service: Obstetrics;  Laterality: N/A;  . CESAREAN SECTION WITH BILATERAL TUBAL LIGATION  04/06/2019   Procedure: CESAREAN SECTION WITH  BILATERAL TUBAL LIGATION;  Surgeon: [name], MD;  Location: [place] [facility];  Service: Obstetrics;;  . Otho Darner SIGMOIDOSCOPY N/A 03/12/2018   Procedure: FLEXIBLE SIGMOIDOSCOPY;  Surgeon: Lin Landsman, MD;  Location: [facility];  Service: Gastroenterology;  Laterality: N/A;  . NO PAST SURGERIES    . WISDOM TOOTH EXTRACTION      Gynecologic History: No LMP recorded.  Obstetric History: G2P0202  Family History:  Family History  Problem Relation Age of Onset  . Colon cancer Mother   . Heart disease Mother   . Diabetes Mother   . Stroke Mother   . Diabetes Father     Social History:  Social History   Socioeconomic History  . Marital status: Married    Spouse name: Not on file  . Number of children: Not on file  . Years of education: Not on file  . Highest education level: Not on file  Occupational History  . Not on file  Tobacco Use  . Smoking status: Never Smoker  . Smokeless tobacco: Never Used  Substance and Sexual Activity  . Alcohol use: No  . Drug use: No  . Sexual activity: Yes    Partners: Male    Birth control/protection: None  Other Topics Concern  . Not on file  Social History Narrative  . Not on file   Social Determinants of Health   Financial Resource Strain:   . Difficulty of Paying Living Expenses: Not on file  Food Insecurity:   . Worried About Estate manager/land agent  of Food in the Last Year: Not on file  . Ran Out of Food in the Last Year: Not on file  Transportation Needs:   . Lack of Transportation (Medical): Not on file  . Lack of Transportation (Non-Medical): Not on file  Physical Activity:   . Days of Exercise per Week: Not on file  . Minutes of Exercise per Session: Not on file  Stress:   . Feeling of Stress : Not on file  Social Connections:   . Frequency of Communication with Friends and Family: Not on file  . Frequency of Social Gatherings with Friends and Family: Not on file  . Attends Religious Services: Not on file  . Active  Member of Clubs or Organizations: Not on file  . Attends Banker Meetings: Not on file  . Marital Status: Not on file  Intimate Partner Violence:   . Fear of Current or Ex-Partner: Not on file  . Emotionally Abused: Not on file  . Physically Abused: Not on file  . Sexually Abused: Not on file    Allergies:  Allergies  Allergen Reactions  . Sumatriptan Succinate Anxiety  . Other     dissolvable staples - body rejects    Medications: Prior to Admission medications   Medication Sig Start Date End Date Taking? Authorizing Provider  albuterol (PROVENTIL HFA;VENTOLIN HFA) 108 (90 Base) MCG/ACT inhaler Inhale 2 puffs into the lungs every 6 (six) hours as needed for wheezing or shortness of breath. 03/04/18  Yes Glori Luis, MD  cephALEXin (KEFLEX) 500 MG capsule Take 1 capsule (500 mg total) by mouth 3 (three) times daily for 7 days. 06/18/19 06/25/19 Yes Mickie Bail, NP  prenatal vitamin w/FE, FA (NATACHEW) 29-1 MG CHEW chewable tablet Chew 1 tablet by mouth daily at 12 noon.   Yes [provider]  sertraline (ZOLOFT) 50 MG tablet Take 1 tablet (50 mg total) by mouth daily. 05/21/19  Yes Vena Austria, MD  lidocaine-prilocaine (EMLA) cream Apply 1 application topically as needed. 04/14/19   Vena Austria, MD  medroxyPROGESTERone (PROVERA) 10 MG tablet Take 2 tablets (20 mg total) by mouth 2 (two) times daily for 5 days. 05/29/19 06/03/19  Conard Novak, MD  nystatin cream (MYCOSTATIN) Apply 1 application topically 2 (two) times daily. 06/19/19   Vena Austria, MD  sulfamethoxazole-trimethoprim (BACTRIM DS) 800-160 MG tablet Take 1 tablet by mouth 2 (two) times daily. 05/26/19   Vena Austria, MD    Physical Exam Vitals:  Vitals:   06/19/19 1042  BP: 124/79   No LMP recorded.  General: NAD, well nourished appears stated age HEENT: normocephalic, anicteric Pulmonary: No increased work of breathing Abdomen: soft, non-tender, non-distended,  incision D/C/I no discharge.  There is some superficial erythema that looks consistent with possible candida.  Superior and to the right is what appears to be a small furuncle that she states her husband popped at home Genitourinary:  External: Normal external female genitalia.  Normal urethral meatus, normal Bartholin's and Skene's glands.   Extremities: no edema, erythema, or tenderness Neurologic: Grossly intact Psychiatric: mood appropriate, affect full  Female chaperone present for pelvic  portions of the physical exam  Assessment: 34 y.o. G2P0202 here for concern about incision, AUB, and vaginal discharge  Plan: Problem List Items Addressed This Visit    None    Visit Diagnoses    Acute vaginitis    -  Primary   Relevant Orders   NuSwab BV and Candida, NAA  1) Incision - no evidence of  Infection or breakdown today but area of erythema.  Superficial and consistent with candida (see media image) - stop using hydrogen peroxidem  - nystatin cream bid  2) Vaginal discharge - Nuswab today  AUB - folllow up in 6 week to see if regulated as still close to delivery.  Discussed may need to add contraceptive for cycle control as opposed to contraception (s/p BTL)  Vena Austria, MD, [facility], [facility] 06/19/2019, 7:18 PM

## 2019-06-19 NOTE — Progress Notes (Signed)
2.4.2021

## 2019-06-20 ENCOUNTER — Ambulatory Visit: Payer: BC Managed Care – PPO | Admitting: Obstetrics and Gynecology

## 2019-06-21 LAB — NUSWAB BV AND CANDIDA, NAA
Candida albicans, NAA: NEGATIVE
Candida glabrata, NAA: NEGATIVE

## 2019-07-09 ENCOUNTER — Other Ambulatory Visit: Payer: Self-pay | Admitting: Obstetrics and Gynecology

## 2019-07-09 MED ORDER — NYSTATIN-TRIAMCINOLONE 100000-0.1 UNIT/GM-% EX CREA: 1.0000 "application " | TOPICAL_CREAM | Freq: Two times a day (BID) | CUTANEOUS | 1 refills | Status: DC

## 2019-07-30 ENCOUNTER — Ambulatory Visit (INDEPENDENT_AMBULATORY_CARE_PROVIDER_SITE_OTHER): Payer: BC Managed Care – PPO | Admitting: Obstetrics and Gynecology

## 2019-07-30 ENCOUNTER — Other Ambulatory Visit: Payer: Self-pay

## 2019-07-30 ENCOUNTER — Encounter: Payer: Self-pay | Admitting: Obstetrics and Gynecology

## 2019-07-30 VITALS — BP 119/76 | Wt 204.0 lb

## 2019-07-30 DIAGNOSIS — B354 Tinea corporis: Secondary | ICD-10-CM

## 2019-07-30 MED ORDER — TRIAMCINOLONE ACETONIDE 0.1 % EX CREA: 1.0000 "application " | TOPICAL_CREAM | Freq: Two times a day (BID) | CUTANEOUS | 1 refills | Status: DC

## 2019-07-30 MED ORDER — NYSTATIN 100000 UNIT/GM EX CREA: 1.0000 "application " | TOPICAL_CREAM | Freq: Two times a day (BID) | CUTANEOUS | 1 refills | Status: DC

## 2019-07-30 NOTE — Progress Notes (Signed)
      Postoperative Follow-up Patient presents post op from [facility] on 05/05/2020  Subjective: Patient reports marked improvement in her preop symptoms. Eating a regular diet without difficulty. The patient is not having any pain.  Activity: normal activities of daily living. Continued concern about how incision has healed and irriation and tenderness  Objective: Blood pressure 119/76, weight 204 lb (92.5 kg), currently breastfeeding.  General: NAD Pulmonary: no increased work of breathing Abdomen: soft, non-tender, non-distended, incision(s) D/C/I, there are no defect in the incision, no evidence of infection Extremities: no edema Neurologic: normal gait    Postpartum Visit on 06/19/2019  Component Date Value Ref Range Status  . Atopobium vaginae 06/19/2019 Low - 0  Score Final  . BVAB 2 06/19/2019 Low - 0  Score Final  . Megasphaera 1 06/19/2019 Low - 0  Score Final   Comment: Calculate total score by adding the 3 individual bacterial vaginosis (BV) marker scores together.  Total score is interpreted as follows: Total score 0-1: Indicates the absence of BV. Total score   2: Indeterminate for BV. Additional clinical                  data should be evaluated to establish a                  diagnosis. Total score 3-6: Indicates the presence of BV. This test was developed and its performance characteristics determined by [facility].  It has not been cleared or approved by the Food and Drug Administration.  The FDA has determined that such clearance or approval is not necessary.   . Candida albicans, NAA 06/19/2019 Negative  Negative Final  . Candida glabrata, NAA 06/19/2019 Negative  Negative Final    Assessment: 34 y.o. s/p RTLCS stable  Plan: Patient has done well after surgery with no apparent complications.  I have discussed the post-operative course to date, and the expected progress moving forward.  The patient understands what complications to be concerned about.  I  will see the patient in routine follow up, or sooner if needed.    Activity plan: No restriction.  Reassured patient that incision looks appropriate at this time.  No concerns as far as how it has healed.  Given still some irritation and some mild candida changes discussed continued use of nystatin cream prn and may add triamcinolone cream   Vena Austria, MD, [facility], [facility] 08/05/2019, 9:28 PM

## 2019-08-20 ENCOUNTER — Encounter: Payer: Self-pay | Admitting: Family Medicine

## 2019-09-08 ENCOUNTER — Encounter: Payer: Self-pay | Admitting: Family Medicine

## 2019-09-08 ENCOUNTER — Ambulatory Visit (INDEPENDENT_AMBULATORY_CARE_PROVIDER_SITE_OTHER): Payer: BC Managed Care – PPO | Admitting: Family Medicine

## 2019-09-08 ENCOUNTER — Other Ambulatory Visit: Payer: Self-pay

## 2019-09-08 VITALS — BP 90/60 | HR 79 | Temp 98.0°F | Ht 63.0 in | Wt 205.0 lb

## 2019-09-08 DIAGNOSIS — E6609 Other obesity due to excess calories: Secondary | ICD-10-CM | POA: Diagnosis not present

## 2019-09-08 DIAGNOSIS — Z6836 Body mass index (BMI) 36.0-36.9, adult: Secondary | ICD-10-CM | POA: Diagnosis not present

## 2019-09-08 DIAGNOSIS — R42 Dizziness and giddiness: Secondary | ICD-10-CM | POA: Diagnosis not present

## 2019-09-08 LAB — COMPREHENSIVE METABOLIC PANEL
ALT: 27 U/L (ref 0–35)
AST: 19 U/L (ref 0–37)
Albumin: 4.1 g/dL (ref 3.5–5.2)
Alkaline Phosphatase: 70 U/L (ref 39–117)
BUN: 9 mg/dL (ref 6–23)
CO2: 26 mEq/L (ref 19–32)
Calcium: 9.2 mg/dL (ref 8.4–10.5)
Chloride: 105 mEq/L (ref 96–112)
Creatinine, Ser: 0.98 mg/dL (ref 0.40–1.20)
GFR: 65.66 mL/min (ref >60.00)
Glucose, Bld: 94 mg/dL (ref 70–99)
Potassium: 4.2 mEq/L (ref 3.5–5.1)
Sodium: 137 mEq/L (ref 135–145)
Total Bilirubin: 0.5 mg/dL (ref 0.2–1.2)
Total Protein: 6.8 g/dL (ref 6.0–8.3)

## 2019-09-08 LAB — LIPID PANEL
Cholesterol: 155 mg/dL (ref 0–200)
HDL: 41.1 mg/dL (ref >39.00)
LDL Cholesterol: 92 mg/dL (ref 0–99)
NonHDL: 113.89
Total CHOL/HDL Ratio: 4
Triglycerides: 108 mg/dL (ref 0.0–149.0)
VLDL: 21.6 mg/dL (ref 0.0–40.0)

## 2019-09-08 LAB — CBC
HCT: 37.8 % (ref 36.0–46.0)
Hemoglobin: 12.6 g/dL (ref 12.0–15.0)
MCHC: 33.4 g/dL (ref 30.0–36.0)
MCV: 85.8 fl (ref 78.0–100.0)
Platelets: 291 10*3/uL (ref 150.0–400.0)
RBC: 4.41 Mil/uL (ref 3.87–5.11)
RDW: 14.2 % (ref 11.5–15.5)
WBC: 6.2 10*3/uL (ref 4.0–10.5)

## 2019-09-08 LAB — HEMOGLOBIN A1C: Hgb A1c MFr Bld: 5.1 % (ref 4.6–6.5)

## 2019-09-08 LAB — TSH: TSH: 3.6 u[IU]/mL (ref 0.35–4.50)

## 2019-09-08 NOTE — Patient Instructions (Addendum)
Nice to see you. Please shoot for 60 ounces of water a day. We will check lab work today. If your lightheadedness is not improving with the increased water intake please let us know. If you pass out please seek medical attention emergency room.

## 2019-09-08 NOTE — Progress Notes (Signed)
  Tommi Rumps, MD Phone: [phone]  Angela Mccormick is a 34 y.o. female who presents today for f/u.  Lightheaded: Patient notes this has been going on since February.  She brought in a list of her blood pressure readings and there are some that are hypotensive and borderline hypotensive.  She notes the lightheadedness can happen when she is just sitting there though can also happen when she gets up.  No syncope.  No vertigo.  She drinks about 24 ounces of water a day.  Also drinks 1-2 sodas and 1 cup of juice.  Her urine is light in color.  There is no polyuria.  No chest pain.  No shortness of breath.  No palpitations.  She is not on blood pressure medicine.  Social History   Tobacco Use  Smoking Status Never Smoker  Smokeless Tobacco Never Used     ROS see history of present illness  Objective  Physical Exam Vitals:   09/08/19 1105  BP: 90/60  Pulse: 79  Temp: 98 F (36.7 C)  SpO2: 99%   Lingual pressure 100/60 pulse 67 Sitting blood pressure 90/50 pulse 77 Standing blood pressure 100/50 pulse 98  BP Readings from Last 3 Encounters:  09/08/19 90/60  07/30/19 119/76  06/19/19 124/79   Wt Readings from Last 3 Encounters:  09/08/19 205 lb (93 kg)  07/30/19 204 lb (92.5 kg)  06/19/19 209 lb (94.8 kg)    Physical Exam Constitutional:      General: She is not in acute distress.    Appearance: She is not diaphoretic.  Cardiovascular:     Rate and Rhythm: Normal rate and regular rhythm.     Heart sounds: Normal heart sounds.  Pulmonary:     Effort: Pulmonary effort is normal.     Breath sounds: Normal breath sounds.  Musculoskeletal:     Right lower leg: No edema.     Left lower leg: No edema.  Skin:    General: Skin is warm and dry.  Neurological:     Mental Status: She is alert.      Assessment/Plan: Please see individual problem list.  Lightheadedness Going on for several months.  Blood pressures reviewed.  They will be scanned into the chart.   Discussed increasing hydration.  We will check lab work to evaluate for underlying causes.  Discussed the potential for cardiology referral if not improving with adequate hydration.   Orders Placed This Encounter  Procedures  . Comp Met (CMET)  . CBC  . TSH  . Lipid panel  . HgB A1c    No orders of the defined types were placed in this encounter.   This visit occurred during the SARS-CoV-2 public health emergency.  Safety protocols were in place, including screening questions prior to the visit, additional usage of staff PPE, and extensive cleaning of exam room while observing appropriate contact time as indicated for disinfecting solutions.    Tommi Rumps, MD [place]

## 2019-09-08 NOTE — Assessment & Plan Note (Signed)
Going on for several months.  Blood pressures reviewed.  They will be scanned into the chart.  Discussed increasing hydration.  We will check lab work to evaluate for underlying causes.  Discussed the potential for cardiology referral if not improving with adequate hydration.

## 2019-09-09 ENCOUNTER — Telehealth: Payer: Self-pay | Admitting: Family Medicine

## 2019-09-09 NOTE — Telephone Encounter (Signed)
Pt called to get lab results °

## 2019-09-10 NOTE — Telephone Encounter (Signed)
I called and LVM for the patient to call back for lab results.  Kinser Fellman,cma  

## 2019-09-10 NOTE — Telephone Encounter (Signed)
Patient called back and I informed her of her lab results. Angela Mccormick,cma

## 2019-09-11 ENCOUNTER — Other Ambulatory Visit: Payer: Self-pay | Admitting: Family Medicine

## 2019-09-11 DIAGNOSIS — R42 Dizziness and giddiness: Secondary | ICD-10-CM

## 2019-09-12 ENCOUNTER — Other Ambulatory Visit: Payer: Self-pay

## 2019-09-12 ENCOUNTER — Encounter: Payer: Self-pay | Admitting: Internal Medicine

## 2019-09-12 ENCOUNTER — Ambulatory Visit (INDEPENDENT_AMBULATORY_CARE_PROVIDER_SITE_OTHER): Payer: BC Managed Care – PPO

## 2019-09-12 ENCOUNTER — Ambulatory Visit (INDEPENDENT_AMBULATORY_CARE_PROVIDER_SITE_OTHER): Payer: BC Managed Care – PPO | Admitting: Internal Medicine

## 2019-09-12 VITALS — BP 124/75 | HR 83 | Ht 63.0 in | Wt 204.2 lb

## 2019-09-12 DIAGNOSIS — R42 Dizziness and giddiness: Secondary | ICD-10-CM

## 2019-09-12 DIAGNOSIS — R0602 Shortness of breath: Secondary | ICD-10-CM

## 2019-09-12 NOTE — Patient Instructions (Addendum)
Medication Instructions:  Your physician recommends that you continue on your current medications as directed. Please refer to the Current Medication list given to you today.  *If you need a refill on your cardiac medications before your next appointment, please call your pharmacy*   Testing/Procedures: Your physician has requested that you have an echocardiogram. Echocardiography is a painless test that uses sound waves to create images of your heart. It provides your doctor with information about the size and shape of your heart and how well your heart's chambers and valves are working. This procedure takes approximately one hour. There are no restrictions for this procedure.  Your physician has recommended that you wear an event monitor. Event monitors are medical devices that record the heart's electrical activity. Doctors most often [place] these monitors to diagnose arrhythmias. Arrhythmias are problems with the speed or rhythm of the heartbeat. The monitor is a small, portable device. You can wear one while you do your normal daily activities. This is usually used to diagnose what is causing palpitations/syncope (passing out).   Follow-Up: At [facility], you and your health needs are our priority.  As part of our continuing mission to provide you with exceptional heart care, we have created designated Provider Care Teams.  These Care Teams include your primary Cardiologist (physician) and Advanced Practice Providers (APPs -  Physician Assistants and Nurse Practitioners) who all work together to provide you with the care you need, when you need it.  We recommend signing up for the patient portal called "MyChart".  Sign up information is provided on this After Visit Summary.  MyChart is used to connect with patients for Virtual Visits (Telemedicine).  Patients are able to view lab/test results, encounter notes, upcoming appointments, etc.  Non-urgent messages can be sent to your provider as well.    To learn more about what you can do with MyChart, go to [email or web address].    Your next appointment:   6 week(s)  The format for your next appointment:   In Person  Provider:    You may see Yvonne Kendall, MD or one of the following Advanced Practice Providers on your designated Care Team:    Nicolasa Ducking, NP  Eula Listen, PA-C  Marisue Ivan, PA-C
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
## 2019-09-13 ENCOUNTER — Encounter: Payer: Self-pay | Admitting: Internal Medicine

## 2019-09-13 DIAGNOSIS — R0602 Shortness of breath: Secondary | ICD-10-CM | POA: Insufficient documentation

## 2019-09-19 DIAGNOSIS — R42 Dizziness and giddiness: Secondary | ICD-10-CM | POA: Diagnosis not present

## 2019-10-17 ENCOUNTER — Other Ambulatory Visit: Payer: Self-pay

## 2019-10-17 ENCOUNTER — Ambulatory Visit (INDEPENDENT_AMBULATORY_CARE_PROVIDER_SITE_OTHER): Payer: BC Managed Care – PPO

## 2019-10-17 DIAGNOSIS — R42 Dizziness and giddiness: Secondary | ICD-10-CM | POA: Diagnosis not present

## 2019-10-23 ENCOUNTER — Other Ambulatory Visit: Payer: Self-pay

## 2019-10-23 ENCOUNTER — Ambulatory Visit (INDEPENDENT_AMBULATORY_CARE_PROVIDER_SITE_OTHER): Payer: BC Managed Care – PPO | Admitting: Family

## 2019-10-23 ENCOUNTER — Encounter: Payer: Self-pay | Admitting: Family

## 2019-10-23 VITALS — BP 120/76 | HR 80 | Ht 63.0 in | Wt 204.5 lb

## 2019-10-23 DIAGNOSIS — M25473 Effusion, unspecified ankle: Secondary | ICD-10-CM

## 2019-10-23 DIAGNOSIS — R42 Dizziness and giddiness: Secondary | ICD-10-CM

## 2019-10-23 NOTE — Patient Instructions (Signed)
Medication Instructions:  No medication changes today.   *If you need a refill on your cardiac medications before your next appointment, please call your pharmacy*  Lab Work: No lab work today.   If you have labs (blood work) drawn today and your tests are completely normal, you will receive your results only by: Marland Kitchen MyChart Message (if you have MyChart) OR . A paper copy in the mail If you have any lab test that is abnormal or we need to change your treatment, we will call you to review the results.  Testing/Procedures: Your recent echocardiogram (ultrasound of your heart) shows normal heart pumping function and normal heart valves.   Your heart monitor showed normal sinus rhythm. You had an occasional (less than 1%) early beat - these are very common and not of concern.   Follow-Up: At [facility], you and your health needs are our priority.  As part of our continuing mission to provide you with exceptional heart care, we have created designated Provider Care Teams.  These Care Teams include your primary Cardiologist (physician) and Advanced Practice Providers (APPs -  Physician Assistants and Nurse Practitioners) who all work together to provide you with the care you need, when you need it.  We recommend signing up for the patient portal called "MyChart".  Sign up information is provided on this After Visit Summary.  MyChart is used to connect with patients for Virtual Visits (Telemedicine).  Patients are able to view lab/test results, encounter notes, upcoming appointments, etc.  Non-urgent messages can be sent to your provider as well.   To learn more about what you can do with MyChart, go to [email or web address].    Your next appointment:   As needed  Other Instructions  Recommend staying very well hydrated. Recommend switching your Coke and Pepsi to caffeine-free as the caffeine is a diuretic that can cause you to be hydrated.
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
## 2019-10-29 DIAGNOSIS — R55 Syncope and collapse: Secondary | ICD-10-CM | POA: Diagnosis not present

## 2019-10-29 DIAGNOSIS — Z6836 Body mass index (BMI) 36.0-36.9, adult: Secondary | ICD-10-CM | POA: Diagnosis not present

## 2019-11-06 DIAGNOSIS — M25512 Pain in left shoulder: Secondary | ICD-10-CM | POA: Diagnosis not present

## 2019-11-06 DIAGNOSIS — N898 Other specified noninflammatory disorders of vagina: Secondary | ICD-10-CM | POA: Diagnosis not present

## 2019-11-18 DIAGNOSIS — M778 Other enthesopathies, not elsewhere classified: Secondary | ICD-10-CM | POA: Diagnosis not present

## 2019-11-18 DIAGNOSIS — M7542 Impingement syndrome of left shoulder: Secondary | ICD-10-CM | POA: Diagnosis not present

## 2019-11-18 DIAGNOSIS — M25512 Pain in left shoulder: Secondary | ICD-10-CM | POA: Diagnosis not present

## 2019-12-08 ENCOUNTER — Encounter: Payer: Self-pay | Admitting: Family Medicine

## 2019-12-08 ENCOUNTER — Other Ambulatory Visit: Payer: Self-pay

## 2019-12-08 ENCOUNTER — Ambulatory Visit (INDEPENDENT_AMBULATORY_CARE_PROVIDER_SITE_OTHER): Payer: BC Managed Care – PPO | Admitting: Family Medicine

## 2019-12-08 DIAGNOSIS — J45909 Unspecified asthma, uncomplicated: Secondary | ICD-10-CM | POA: Insufficient documentation

## 2019-12-08 DIAGNOSIS — J4520 Mild intermittent asthma, uncomplicated: Secondary | ICD-10-CM | POA: Diagnosis not present

## 2019-12-08 DIAGNOSIS — R42 Dizziness and giddiness: Secondary | ICD-10-CM

## 2019-12-08 MED ORDER — ALBUTEROL SULFATE HFA 108 (90 BASE) MCG/ACT IN AERS: 2.0000 | INHALATION_SPRAY | Freq: Four times a day (QID) | RESPIRATORY_TRACT | 0 refills | Status: DC | PRN

## 2019-12-08 NOTE — Assessment & Plan Note (Signed)
Overall doing well.  Refill albuterol.  She will monitor for any increasing symptoms.

## 2019-12-08 NOTE — Patient Instructions (Signed)
Nice to see you. You can use the albuterol as needed for asthma symptoms.  If they get worse please let us know. Please monitor the lightheadedness.  Please increase fluid intake.

## 2019-12-08 NOTE — Assessment & Plan Note (Addendum)
Improved.  Encouraged adequate hydration.  She will monitor. Neurology ordered autonomic testing and the patient will contact them to follow up on that.

## 2019-12-08 NOTE — Progress Notes (Signed)
  [name], MD Phone: [phone]  Angela Mccormick is a 34 y.o. female who presents today for f/u.  Lightheadedness: Patient notes this is not occurring as frequently.  She is trying to get 60 ounces of water daily though some days does not get that.  She has not been checking her blood pressure.  No syncope. She saw neurology and cardiology for this. ZIO with rare PVC/PAC. Echo no significant abnormalities.  Asthma: Rarely uses her albuterol.  She needs a refill as she has run out.  She notes her typical symptoms are wheezing particularly with weather changes.  No cough.  Social History   Tobacco Use  Smoking Status Never Smoker  Smokeless Tobacco Never Used     ROS see history of present illness  Objective  Physical Exam Vitals:   12/08/19 1319  BP: (!) 100/60  Pulse: 72  Temp: 97.9 F (36.6 C)  SpO2: 98%    BP Readings from Last 3 Encounters:  12/08/19 (!) 100/60  10/23/19 120/76  09/12/19 124/75   Wt Readings from Last 3 Encounters:  12/08/19 (!) 204 lb 3.2 oz (92.6 kg)  10/23/19 204 lb 8 oz (92.8 kg)  09/12/19 204 lb 4 oz (92.6 kg)    Physical Exam Constitutional:      General: She is not in acute distress.    Appearance: She is not diaphoretic.  Cardiovascular:     Rate and Rhythm: Normal rate and regular rhythm.     Heart sounds: Normal heart sounds.  Pulmonary:     Effort: Pulmonary effort is normal.     Breath sounds: Normal breath sounds.  Skin:    General: Skin is warm and dry.  Neurological:     Mental Status: She is alert.      Assessment/Plan: Please see individual problem list.  Lightheadedness Improved.  Encouraged adequate hydration.  She will monitor. Neurology ordered autonomic testing and the patient will contact them to follow up on that.   Asthma Overall doing well.  Refill albuterol.  She will monitor for any increasing symptoms.    No orders of the defined types were placed in this encounter.   Meds ordered  this encounter  Medications  . albuterol (VENTOLIN HFA) 108 (90 Base) MCG/ACT inhaler    Sig: Inhale 2 puffs into the lungs every 6 (six) hours as needed for wheezing or shortness of breath.    Dispense:  8 g    Refill:  0    This visit occurred during the SARS-CoV-2 public health emergency.  Safety protocols were in place, including screening questions prior to the visit, additional usage of staff PPE, and extensive cleaning of exam room while observing appropriate contact time as indicated for disinfecting solutions.    [name], MD [facility]

## 2020-01-29 DIAGNOSIS — R55 Syncope and collapse: Secondary | ICD-10-CM | POA: Diagnosis not present

## 2020-01-29 DIAGNOSIS — R2681 Unsteadiness on feet: Secondary | ICD-10-CM | POA: Diagnosis not present

## 2020-02-13 ENCOUNTER — Other Ambulatory Visit
Admission: RE | Admit: 2020-02-13 | Discharge: 2020-02-13 | Disposition: A | Discharge status: Home / Self Care | Payer: BC Managed Care – PPO | Source: Ambulatory Visit | Attending: Sports Medicine | Admitting: Sports Medicine

## 2020-02-13 DIAGNOSIS — M25571 Pain in right ankle and joints of right foot: Secondary | ICD-10-CM | POA: Insufficient documentation

## 2020-02-13 DIAGNOSIS — M659 Synovitis and tenosynovitis, unspecified: Secondary | ICD-10-CM | POA: Diagnosis not present

## 2020-02-13 DIAGNOSIS — M25471 Effusion, right ankle: Secondary | ICD-10-CM | POA: Diagnosis not present

## 2020-02-13 LAB — SYNOVIAL CELL COUNT + DIFF, W/ CRYSTALS
Crystals, Fluid: NONE SEEN
Eosinophils-Synovial: 1 %
Lymphocytes-Synovial Fld: 15 %
Monocyte-Macrophage-Synovial Fluid: 3 %
Neutrophil, Synovial: 81 %
WBC, Synovial: 3510 /mm3 — ABNORMAL HIGH (ref 0–200)

## 2020-02-27 DIAGNOSIS — M25471 Effusion, right ankle: Secondary | ICD-10-CM | POA: Diagnosis not present

## 2020-02-27 DIAGNOSIS — M659 Synovitis and tenosynovitis, unspecified: Secondary | ICD-10-CM | POA: Diagnosis not present

## 2020-02-27 DIAGNOSIS — M25571 Pain in right ankle and joints of right foot: Secondary | ICD-10-CM | POA: Diagnosis not present

## 2020-03-05 ENCOUNTER — Telehealth: Payer: Self-pay | Admitting: Family Medicine

## 2020-03-05 ENCOUNTER — Other Ambulatory Visit: Payer: Self-pay

## 2020-03-05 ENCOUNTER — Encounter: Payer: Self-pay | Admitting: Family Medicine

## 2020-03-05 ENCOUNTER — Ambulatory Visit (INDEPENDENT_AMBULATORY_CARE_PROVIDER_SITE_OTHER): Payer: BC Managed Care – PPO | Admitting: Family Medicine

## 2020-03-05 VITALS — BP 100/70 | HR 76 | Temp 98.3°F | Ht 63.0 in | Wt 205.4 lb

## 2020-03-05 DIAGNOSIS — Z23 Encounter for immunization: Secondary | ICD-10-CM

## 2020-03-05 DIAGNOSIS — Z13220 Encounter for screening for lipoid disorders: Secondary | ICD-10-CM | POA: Diagnosis not present

## 2020-03-05 DIAGNOSIS — E66811 Obesity, class 1: Secondary | ICD-10-CM | POA: Insufficient documentation

## 2020-03-05 DIAGNOSIS — Z0001 Encounter for general adult medical examination with abnormal findings: Secondary | ICD-10-CM | POA: Diagnosis not present

## 2020-03-05 DIAGNOSIS — Z0000 Encounter for general adult medical examination without abnormal findings: Secondary | ICD-10-CM | POA: Insufficient documentation

## 2020-03-05 DIAGNOSIS — F321 Major depressive disorder, single episode, moderate: Secondary | ICD-10-CM | POA: Diagnosis not present

## 2020-03-05 DIAGNOSIS — F32A Depression, unspecified: Secondary | ICD-10-CM | POA: Insufficient documentation

## 2020-03-05 DIAGNOSIS — R221 Localized swelling, mass and lump, neck: Secondary | ICD-10-CM | POA: Diagnosis not present

## 2020-03-05 DIAGNOSIS — E669 Obesity, unspecified: Secondary | ICD-10-CM | POA: Diagnosis not present

## 2020-03-05 DIAGNOSIS — F419 Anxiety disorder, unspecified: Secondary | ICD-10-CM | POA: Insufficient documentation

## 2020-03-05 LAB — COMPREHENSIVE METABOLIC PANEL
ALT: 31 U/L (ref 0–35)
AST: 18 U/L (ref 0–37)
Albumin: 4.1 g/dL (ref 3.5–5.2)
Alkaline Phosphatase: 71 U/L (ref 39–117)
BUN: 7 mg/dL (ref 6–23)
CO2: 29 mEq/L (ref 19–32)
Calcium: 9.2 mg/dL (ref 8.4–10.5)
Chloride: 104 mEq/L (ref 96–112)
Creatinine, Ser: 0.59 mg/dL (ref 0.40–1.20)
GFR: 118.98 mL/min (ref >60.00)
Glucose, Bld: 77 mg/dL (ref 70–99)
Potassium: 4.2 mEq/L (ref 3.5–5.1)
Sodium: 137 mEq/L (ref 135–145)
Total Bilirubin: 0.5 mg/dL (ref 0.2–1.2)
Total Protein: 6.3 g/dL (ref 6.0–8.3)

## 2020-03-05 LAB — LIPID PANEL
Cholesterol: 141 mg/dL (ref 0–200)
HDL: 43.2 mg/dL (ref >39.00)
LDL Cholesterol: 77 mg/dL (ref 0–99)
NonHDL: 98.13
Total CHOL/HDL Ratio: 3
Triglycerides: 107 mg/dL (ref 0.0–149.0)
VLDL: 21.4 mg/dL (ref 0.0–40.0)

## 2020-03-05 LAB — HEMOGLOBIN A1C: Hgb A1c MFr Bld: 5.4 % (ref 4.6–6.5)

## 2020-03-05 MED ORDER — SERTRALINE HCL 50 MG PO TABS: 50.0000 mg | ORAL_TABLET | Freq: Every day | ORAL | 3 refills | Status: DC

## 2020-03-05 NOTE — Progress Notes (Signed)
Tommi Rumps, MD Phone: [phone]  Angela Mccormick is a 34 y.o. female who presents today for CPE.  Diet: eating mostly junk food Exercise: none currently, though does not she gets 30 minutes of activity 5 days a week Pap smear: 06/19/17 NILM, neg HPV- with GYN Family history-  Colon cancer: no  Breast cancer: paternal grandmother  Ovarian cancer: no Menses: once monthly lasting 1-3 days Sexually active: husband Vaccines-   Flu: given today  Tetanus: UTD  COVID19: considering, though has not decided to get this yet HIV screening: UTD Hep C Screening: due Tobacco use: no Alcohol use: no Illicit Drug use: no Dentist: yes Ophthalmology: yes  Depression: Patient notes depression has become an issue recently.  She notes there has not been a specific life trigger or change this year though does note last year was bad as her husband's parents died in a murder suicide.  She has lots of support from her husband.  She notes her sex drive has gone away.  Her appetite at times is increased though at other times it is decreased.  No SI.  Right ankle pain: She is seeing orthopedics for this.  She is currently in a boot for inflammation in her ankle.  Nodule of neck: Patient with improvement for a couple of years.  Notes it has gotten bigger recently.  Some tenderness to palpation.  No prior work-up completed.   Active Ambulatory Problems    Diagnosis Date Noted  . Chiari malformation ([place]) 09/26/2012  . Migraines 09/26/2012  . Nodule of neck 11/01/2016  . Rectal bleeding 03/04/2018  . Lightheadedness 09/08/2019  . Asthma 12/08/2019  . Encounter for general adult medical examination with abnormal findings 03/05/2020  . Depression, major, single episode, moderate ([place]) 03/05/2020  . Obesity (BMI 30.0-34.9) 03/05/2020   Resolved Ambulatory Problems    Diagnosis Date Noted  . Syncope 09/26/2012  . Rash 11/01/2016  . Elevated glucose 04/13/2017  . Trying to get pregnant  04/13/2017  . BMI 37.0-37.9, adult 04/13/2017  . Supervision of high risk pregnancy, antepartum 06/19/2017  . Obesity affecting pregnancy, antepartum 06/19/2017  . Influenza 07/27/2017  . Nausea and vomiting during pregnancy 08/13/2017  . Preterm premature rupture of membranes 12/15/2017  . Postpartum care following cesarean delivery 12/20/2017  . Routine general medical examination at a health care facility 03/04/2018  . Nonhealing surgical wound 03/04/2018  . Otitis media 04/27/2018  . Short interval between pregnancies affecting pregnancy, antepartum 09/11/2018  . History of preterm delivery, currently pregnant 11/06/2018  . History of cesarean section 11/28/2018  . History of gestational diabetes 03/14/2019  . Preterm premature rupture of membranes 04/06/2019  . Encounter for care and examination of lactating mother 04/10/2019  . Shortness of breath 09/13/2019   Past Medical History:  Diagnosis Date  . Depression   . Gestational diabetes   . History of Chiari malformation   . History of syncope   . Migraine     Family History  Problem Relation Age of Onset  . Colon cancer Mother   . Diabetes Mother   . Stroke Mother   . Diabetes Father   . Stroke Maternal Grandfather   . Heart disease Maternal Grandfather        CABG  . Stroke Maternal Aunt     Social History   Socioeconomic History  . Marital status: Married    Spouse name: Not on file  . Number of children: Not on file  . Years of education: Not on  file  . Highest education level: Not on file  Occupational History  . Not on file  Tobacco Use  . Smoking status: Never Smoker  . Smokeless tobacco: Never Used  Vaping Use  . Vaping Use: Never used  Substance and Sexual Activity  . Alcohol use: Not Currently    Comment: 1 drink/year  . Drug use: No  . Sexual activity: Yes    Partners: Male    Birth control/protection: None  Other Topics Concern  . Not on file  Social History Narrative  . Not on file    Social Determinants of Health   Financial Resource Strain:   . Difficulty of Paying Living Expenses: Not on file  Food Insecurity:   . Worried About Charity fundraiser in the Last Year: Not on file  . Ran Out of Food in the Last Year: Not on file  Transportation Needs:   . Lack of Transportation (Medical): Not on file  . Lack of Transportation (Non-Medical): Not on file  Physical Activity:   . Days of Exercise per Week: Not on file  . Minutes of Exercise per Session: Not on file  Stress:   . Feeling of Stress : Not on file  Social Connections:   . Frequency of Communication with Friends and Family: Not on file  . Frequency of Social Gatherings with Friends and Family: Not on file  . Attends Religious Services: Not on file  . Active Member of Clubs or Organizations: Not on file  . Attends Archivist Meetings: Not on file  . Marital Status: Not on file  Intimate Partner Violence:   . Fear of Current or Ex-Partner: Not on file  . Emotionally Abused: Not on file  . Physically Abused: Not on file  . Sexually Abused: Not on file    ROS  General:  Negative for nexplained weight loss, fever Skin: Negative for new or changing mole, sore that won't heal HEENT: Negative for trouble hearing, trouble seeing, ringing in ears, mouth sores, hoarseness, change in voice, dysphagia. CV:  Negative for chest pain, dyspnea, edema, palpitations Resp: Negative for cough, dyspnea, hemoptysis GI: Negative for nausea, vomiting, diarrhea, constipation, abdominal pain, melena, hematochezia. GU: Positive for sexual difficulty, negative for dysuria, incontinence, urinary hesitance, hematuria, vaginal or penile discharge, polyuria, lumps in testicle or breasts MSK: Positive for joint pain and swelling of ankle, negative for muscle cramps or aches Neuro: Negative for headaches, weakness, numbness, dizziness, passing out/fainting Psych: Positive for depression, negative for anxiety, memory  problems  Objective  Physical Exam Vitals:   03/05/20 1342 03/05/20 1415  BP: 105/80 100/70  Pulse: 76   Temp: 98.3 F (36.8 C)   SpO2: 99%     BP Readings from Last 3 Encounters:  03/05/20 100/70  12/08/19 (!) 100/60  10/23/19 120/76   Wt Readings from Last 3 Encounters:  03/05/20 205 lb 6.4 oz (93.2 kg)  12/08/19 (!) 204 lb 3.2 oz (92.6 kg)  10/23/19 204 lb 8 oz (92.8 kg)    Physical Exam Constitutional:      General: She is not in acute distress.    Appearance: She is not diaphoretic.  HENT:     Head: Normocephalic and atraumatic.  Eyes:     Conjunctiva/sclera: Conjunctivae normal.     Pupils: Pupils are equal, round, and reactive to light.  Neck:   Cardiovascular:     Rate and Rhythm: Normal rate and regular rhythm.     Heart sounds: Normal heart  sounds.  Pulmonary:     Effort: Pulmonary effort is normal.     Breath sounds: Normal breath sounds.  Abdominal:     General: Bowel sounds are normal. There is no distension.     Palpations: Abdomen is soft.     Tenderness: There is no abdominal tenderness. There is no guarding or rebound.  Musculoskeletal:     Right lower leg: No edema.     Left lower leg: No edema.  Lymphadenopathy:     Cervical: No cervical adenopathy.  Skin:    General: Skin is warm and dry.  Neurological:     Mental Status: She is alert.      Assessment/Plan:   Problem List Items Addressed This Visit    Depression, major, single episode, moderate ([place])    Possibly related to life events from last year.  We will start on Zoloft 50 mg once daily.  Advised of risk of sexual dysfunction, weight gain, sleep difficulty.  If she has any issues she will let us know.  She can contact me in 4 weeks if she has not noticed a benefit and we can increase to 100 mg.  Follow-up in 6 weeks.      Relevant Medications   sertraline (ZOLOFT) 50 MG tablet   Encounter for general adult medical examination with abnormal findings    Physical exam  completed.  Encouraged dietary changes with inclusion of lean meats and fruits and vegetables.  I encouraged her to exercise as she is able to once she is released to do so by orthopedics.  She will follow with GYN for her pelvic exam and breast exam.  I did encourage her to get the COVID-19 vaccine and discussed the safety and efficacy of this.  Lab work as outlined below.      Nodule of neck    Possible nodule noted.  We will order an ultrasound to evaluate for an underlying cause.      Relevant Orders   US Soft Tissue Head/Neck (NON-THYROID)   Obesity (BMI 30.0-34.9)   Relevant Orders   HgB A1c    Other Visit Diagnoses    Lipid screening    -  Primary   Relevant Orders   Comp Met (CMET)   Lipid panel      This visit occurred during the SARS-CoV-2 public health emergency.  Safety protocols were in place, including screening questions prior to the visit, additional usage of staff PPE, and extensive cleaning of exam room while observing appropriate contact time as indicated for disinfecting solutions.    Tommi Rumps, MD [place]

## 2020-03-05 NOTE — Telephone Encounter (Signed)
lft vm for pt to call ofc to sch US head and neck. 

## 2020-03-05 NOTE — Patient Instructions (Signed)
Nice to see you. Please work on dietary changes.  I have included some diet information below. Please try to add in some exercise once orthopedics okays you to do so. We will start you on Zoloft for depression.  Please let me know in 4 weeks if the 50 mg dose is not beneficial. Somebody will call you to schedule the ultrasound of your neck.

## 2020-03-05 NOTE — Assessment & Plan Note (Signed)
Possibly related to life events from last year.  We will start on Zoloft 50 mg once daily.  Advised of risk of sexual dysfunction, weight gain, sleep difficulty.  If she has any issues she will let us know.  She can contact me in 4 weeks if she has not noticed a benefit and we can increase to 100 mg.  Follow-up in 6 weeks.

## 2020-03-05 NOTE — Assessment & Plan Note (Signed)
Possible nodule noted.  We will order an ultrasound to evaluate for an underlying cause.

## 2020-03-05 NOTE — Assessment & Plan Note (Signed)
Physical exam completed.  Encouraged dietary changes with inclusion of lean meats and fruits and vegetables.  I encouraged her to exercise as she is able to once she is released to do so by orthopedics.  She will follow with GYN for her pelvic exam and breast exam.  I did encourage her to get the COVID-19 vaccine and discussed the safety and efficacy of this.  Lab work as outlined below.

## 2020-03-15 ENCOUNTER — Other Ambulatory Visit: Payer: Self-pay

## 2020-03-15 ENCOUNTER — Ambulatory Visit
Admission: RE | Admit: 2020-03-15 | Discharge: 2020-03-15 | Disposition: A | Discharge status: Home / Self Care | Payer: BC Managed Care – PPO | Source: Ambulatory Visit | Attending: Family Medicine | Admitting: Family Medicine

## 2020-03-15 DIAGNOSIS — R221 Localized swelling, mass and lump, neck: Secondary | ICD-10-CM | POA: Diagnosis not present

## 2020-04-19 ENCOUNTER — Encounter: Payer: Self-pay | Admitting: Family Medicine

## 2020-04-19 ENCOUNTER — Other Ambulatory Visit: Payer: Self-pay

## 2020-04-19 ENCOUNTER — Ambulatory Visit (INDEPENDENT_AMBULATORY_CARE_PROVIDER_SITE_OTHER): Payer: BC Managed Care – PPO | Admitting: Family Medicine

## 2020-04-19 VITALS — BP 115/80 | HR 77 | Temp 97.3°F | Ht 63.0 in | Wt 204.2 lb

## 2020-04-19 DIAGNOSIS — R21 Rash and other nonspecific skin eruption: Secondary | ICD-10-CM

## 2020-04-19 DIAGNOSIS — F321 Major depressive disorder, single episode, moderate: Secondary | ICD-10-CM | POA: Diagnosis not present

## 2020-04-19 DIAGNOSIS — R0981 Nasal congestion: Secondary | ICD-10-CM

## 2020-04-19 MED ORDER — SERTRALINE HCL 100 MG PO TABS: 100.0000 mg | ORAL_TABLET | Freq: Every day | ORAL | 2 refills | Status: DC

## 2020-04-19 MED ORDER — AMOXICILLIN-POT CLAVULANATE 875-125 MG PO TABS: 1.0000 | ORAL_TABLET | Freq: Two times a day (BID) | ORAL | 0 refills | Status: DC

## 2020-04-19 MED ORDER — NYSTATIN 100000 UNIT/GM EX POWD: 1.0000 "application " | Freq: Three times a day (TID) | CUTANEOUS | 0 refills | Status: DC

## 2020-04-19 NOTE — Assessment & Plan Note (Signed)
Suspect candidal intertrigo given location. Will treat with nystatin powder. If not improving she will let us know.

## 2020-04-19 NOTE — Progress Notes (Signed)
Virtual Visit via telephone Note  This visit type was conducted due to national recommendations for restrictions regarding the COVID-19 pandemic (e.g. social distancing).  This format is felt to be most appropriate for this patient at this time.  All issues noted in this document were discussed and addressed.  No physical exam was performed (except for noted visual exam findings with Video Visits).   I connected with Angela Mccormick today at  2:45 PM EST by a video enabled telemedicine application or telephone and verified that I am speaking with the correct person using two identifiers. Location patient: exam room Location provider: work Persons participating in the virtual visit: patient, provider  I discussed the limitations, risks, security and privacy concerns of performing an evaluation and management service by telephone and the availability of in person appointments. I also discussed with the patient that there may be a patient responsible charge related to this service. The patient expressed understanding and agreed to proceed.  Interactive audio and video telecommunications were attempted between this provider and patient, however failed, due to patient having technical difficulties OR patient did not have access to video capability.  We continued and completed visit with audio only.   Reason for visit: f/u.  HPI: Depression: notes this is unchanged. Still has no sex drive. She has been on zoloft 50 mg daily. No SI.   Sinus congestion: this has been ongoing for 2 weeks. Has PND and ear discomfort. No cough, SOB, fever, smell loss, or COVID exposure. She does note she can only taste really sweet or salty thing. She is not vaccinated against COVID. No medications for this.   Rash: in her arm pits for several months. It comes and goes. It is red and can be painful. No itching. She tried baby powder on it.    ROS: See pertinent positives and negatives per HPI.  Past Medical  History:  Diagnosis Date  . Asthma   . Depression   . Gestational diabetes   . History of Chiari malformation   . History of syncope   . Migraine     Past Surgical History:  Procedure Laterality Date  . CESAREAN SECTION N/A 12/17/2017   Procedure: CESAREAN SECTION;  Surgeon: Vena Austria, MD;  Location: [facility];  Service: Obstetrics;  Laterality: N/A;  . CESAREAN SECTION WITH BILATERAL TUBAL LIGATION  04/06/2019   Procedure: CESAREAN SECTION WITH BILATERAL TUBAL LIGATION;  Surgeon: Vena Austria, MD;  Location: [facility];  Service: Obstetrics;;  . Wenda Low SIGMOIDOSCOPY N/A 03/12/2018   Procedure: FLEXIBLE SIGMOIDOSCOPY;  Surgeon: Toney Reil, MD;  Location: [facility];  Service: Gastroenterology;  Laterality: N/A;  . NO PAST SURGERIES    . WISDOM TOOTH EXTRACTION      Family History  Problem Relation Age of Onset  . Colon cancer Mother   . Diabetes Mother   . Stroke Mother   . Diabetes Father   . Stroke Maternal Grandfather   . Heart disease Maternal Grandfather        CABG  . Stroke Maternal Aunt     SOCIAL HX: nonsmoker   Current Outpatient Medications:  .  albuterol (VENTOLIN HFA) 108 (90 Base) MCG/ACT inhaler, Inhale 2 puffs into the lungs every 6 (six) hours as needed for wheezing or shortness of breath., Disp: 8 g, Rfl: 0 .  meloxicam (MOBIC) 15 MG tablet, Take 15 mg by mouth daily., Disp: , Rfl:  .  Multiple Vitamins-Minerals (EQ MULTIVITAMINS ADULT GUMMY PO), Take by mouth daily.,  Disp: , Rfl:  .  sertraline (ZOLOFT) 100 MG tablet, Take 1 tablet (100 mg total) by mouth daily., Disp: 30 tablet, Rfl: 2 .  amoxicillin-clavulanate (AUGMENTIN) 875-125 MG tablet, Take 1 tablet by mouth 2 (two) times daily., Disp: 14 tablet, Rfl: 0 .  nystatin (MYCOSTATIN/NYSTOP) powder, Apply 1 application topically 3 (three) times daily., Disp: 15 g, Rfl: 0  EXAM: This was a telephone visit and thus no exam was completed.   ASSESSMENT AND PLAN:  Discussed the  following assessment and plan:  Problem List Items Addressed This Visit    Congestion of nasal sinus - Primary    Suspect sinusitis given her duration of symptoms though advised that we would need to get a COVID test to rule that out. She will quarantine at home at least until we get a negative COVID test. She was advised to use the dirve through for her prescriptions. Discussed that it would be best if her household members quarantined until we get a test result and a work note was provided. Will treat with augmentin for a likely sinus infection.      Relevant Orders   Novel Coronavirus, NAA ([facility])   Depression, major, single episode, moderate ([facility])    No improvement. We will increase her zoloft to 100 mg daily. She will follow-up in 6 weeks.       Relevant Medications   sertraline (ZOLOFT) 100 MG tablet   Rash    Suspect candidal intertrigo given location. Will treat with nystatin powder. If not improving she will let us know.           I discussed the assessment and treatment plan with the patient. The patient was provided an opportunity to ask questions and all were answered. The patient agreed with the plan and demonstrated an understanding of the instructions.   The patient was advised to call back or seek an in-person evaluation if the symptoms worsen or if the condition fails to improve as anticipated.  I provided 12 minutes of non-face-to-face time during this encounter.   [name], MD

## 2020-04-19 NOTE — Assessment & Plan Note (Signed)
No improvement. We will increase her zoloft to 100 mg daily. She will follow-up in 6 weeks.

## 2020-04-19 NOTE — Assessment & Plan Note (Signed)
Suspect sinusitis given her duration of symptoms though advised that we would need to get a COVID test to rule that out. She will quarantine at home at least until we get a negative COVID test. She was advised to use the dirve through for her prescriptions. Discussed that it would be best if her household members quarantined until we get a test result and a work note was provided. Will treat with augmentin for a likely sinus infection.

## 2020-04-21 ENCOUNTER — Encounter: Payer: Self-pay | Admitting: Family Medicine

## 2020-04-21 LAB — NOVEL CORONAVIRUS, NAA: SARS-CoV-2, NAA: NOT DETECTED

## 2020-04-21 LAB — SARS-COV-2, NAA 2 DAY TAT

## 2020-05-16 ENCOUNTER — Ambulatory Visit
Admission: EM | Admit: 2020-05-16 | Discharge: 2020-05-16 | Disposition: A | Discharge status: Home / Self Care | Payer: BC Managed Care – PPO | Attending: Family Medicine | Admitting: Family Medicine

## 2020-05-16 ENCOUNTER — Encounter: Payer: Self-pay | Admitting: Emergency Medicine

## 2020-05-16 ENCOUNTER — Other Ambulatory Visit: Payer: Self-pay

## 2020-05-16 DIAGNOSIS — J069 Acute upper respiratory infection, unspecified: Secondary | ICD-10-CM

## 2020-05-16 MED ORDER — BENZONATATE 200 MG PO CAPS: ORAL_CAPSULE | ORAL | 0 refills | Status: DC

## 2020-05-16 NOTE — Discharge Instructions (Signed)
Take the [employer] with your Delsym cough medicine Drink lots of fluids Call here or to your primary care if you fail to improve by 10 days

## 2020-05-16 NOTE — ED Triage Notes (Signed)
chest congestion, ear pain x 2 days

## 2020-05-17 NOTE — ED Provider Notes (Signed)
[facility]    CSN: [id number] Arrival date & time: 05/16/20  1512      History   Chief Complaint Chief Complaint  Patient presents with  . Cough    HPI Angela Mccormick is a 35 y.o. female.   HPI  Patient had a video visit with her primary care doctor on 12/Solik/2021.  She had an upper respiratory infection and "sinus" infection.  She had symptoms for 2 weeks.  Her doctor demanded a Covid test prior to treatment.  When the Covid test was negative she was given a week of Augmentin.  She finished this just a couple of weeks ago.  Now she has "sinus" congestion, cough, and ear pain again.  Desires antibiotics. Refuses another Covid test.  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  States her children have already been tested.  States she does not see any reason to repeat Covid testing even though her last Covid test was a month, and she has new symptoms since then.  Past Medical History:  Diagnosis Date  . Asthma   . Depression   . Gestational diabetes   . History of Chiari malformation   . History of syncope   . Migraine     Patient Active Problem List   Diagnosis Date Noted  . Congestion of nasal sinus 04/19/2020  . Rash 04/19/2020  . Encounter for general adult medical examination with abnormal findings 03/05/2020  . Depression, major, single episode, moderate ([place]) 03/05/2020  . Obesity (BMI 30.0-34.9) 03/05/2020  . Asthma 12/08/2019  . Lightheadedness 09/08/2019  . Rectal bleeding 03/04/2018  . Nodule of neck 11/01/2016  . Chiari malformation ([place]) 09/26/2012  . Migraines 09/26/2012    Past Surgical History:  Procedure Laterality Date  . CESAREAN SECTION N/A 12/17/2017   Procedure: CESAREAN SECTION;  Surgeon: [name], MD;  Location: [facility];  Service: Obstetrics;  Laterality: N/A;  . CESAREAN SECTION WITH BILATERAL TUBAL LIGATION  04/06/2019   Procedure: CESAREAN SECTION WITH BILATERAL TUBAL LIGATION;  Surgeon: [name], MD;  Location:  [facility];  Service: Obstetrics;;  . Otho Darner SIGMOIDOSCOPY N/A 03/12/2018   Procedure: FLEXIBLE SIGMOIDOSCOPY;  Surgeon: Lin Landsman, MD;  Location: [facility];  Service: Gastroenterology;  Laterality: N/A;  . NO PAST SURGERIES    . WISDOM TOOTH EXTRACTION      OB History    Gravida  2   Para  2   Term  0   Preterm  2   AB      Living  2     SAB      IAB      Ectopic      Multiple  0   Live Births  2            Home Medications    Prior to Admission medications   Medication Sig Start Date End Date Taking? Authorizing Provider  benzonatate (TESSALON) 200 MG capsule Take 1 capsule 2-3 times a day for cough 05/16/20  Yes Raylene Everts, MD  albuterol (VENTOLIN HFA) 108 (90 Base) MCG/ACT inhaler Inhale 2 puffs into the lungs every 6 (six) hours as needed for wheezing or shortness of breath. 12/08/19   Leone Haven, MD  amoxicillin-clavulanate (AUGMENTIN) 875-125 MG tablet Take 1 tablet by mouth 2 (two) times daily. 04/19/20   Leone Haven, MD  meloxicam (MOBIC) 15 MG tablet Take 15 mg by mouth daily. 11/18/19   [provider]  Multiple Vitamins-Minerals (EQ MULTIVITAMINS ADULT  GUMMY PO) Take by mouth daily.    [provider]  nystatin (MYCOSTATIN/NYSTOP) powder Apply 1 application topically 3 (three) times daily. 04/19/20   Glori Luis, MD  sertraline (ZOLOFT) 100 MG tablet Take 1 tablet (100 mg total) by mouth daily. 04/19/20   Glori Luis, MD    Family History Family History  Problem Relation Age of Onset  . Colon cancer Mother   . Diabetes Mother   . Stroke Mother   . Diabetes Father   . Stroke Maternal Grandfather   . Heart disease Maternal Grandfather        CABG  . Stroke Maternal Aunt     Social History Social History   Tobacco Use  . Smoking status: Never Smoker  . Smokeless tobacco: Never Used  Vaping Use  . Vaping Use: Never used  Substance Use Topics  . Alcohol use: Not Currently     Comment: 1 drink/year  . Drug use: No     Allergies   Sumatriptan succinate and Other   Review of Systems Review of Systems See HPI  Physical Exam Triage Vital Signs ED Triage Vitals [05/16/20 1647]  Enc Vitals Group     BP 121/72     Pulse Rate 70     Resp 15     Temp 98.2 F (36.8 C)     Temp src      SpO2 98 %     Weight      Height      Head Circumference      Peak Flow      Pain Score      Pain Loc      Pain Edu?      Excl. in GC?    No data found.  Updated Vital Signs BP 121/72   Pulse 70   Temp 98.2 F (36.8 C)   Resp 15   SpO2 98%     Physical Exam Constitutional:      General: She is not in acute distress.    Appearance: She is well-developed and well-nourished. She is obese.     Comments: No acute distress  HENT:     Head: Normocephalic and atraumatic.     Right Ear: Tympanic membrane, ear canal and external ear normal.     Left Ear: Tympanic membrane, ear canal and external ear normal.     Nose: Congestion present.     Comments: Clear rhinorrhea    Mouth/Throat:     Mouth: Oropharynx is clear and moist. Mucous membranes are moist.     Pharynx: No posterior oropharyngeal erythema.  Eyes:     Conjunctiva/sclera: Conjunctivae normal.     Pupils: Pupils are equal, round, and reactive to light.  Cardiovascular:     Rate and Rhythm: Normal rate and regular rhythm.     Heart sounds: Normal heart sounds.  Pulmonary:     Effort: Pulmonary effort is normal. No respiratory distress.     Breath sounds: Normal breath sounds.  Abdominal:     General: There is no distension.     Palpations: Abdomen is soft.  Musculoskeletal:        General: No edema. Normal range of motion.     Cervical back: Normal range of motion and neck supple.  Skin:    General: Skin is warm and dry.  Neurological:     General: No focal deficit present.     Mental Status: She is alert.  Psychiatric:  Behavior: Behavior normal.      UC Treatments / Results   Labs (all labs ordered are listed, but only abnormal results are displayed) Labs Reviewed - No data to display  EKG   Radiology No results found.  Procedures Procedures (including critical care time)  Medications Ordered in UC Medications - No data to display  Initial Impression / Assessment and Plan / UC Course  I have reviewed the triage vital signs and the nursing notes.  Pertinent labs & imaging results that were available during my care of the patient were reviewed by me and considered in my medical decision making (see chart for details).     Explained to patient that with only a few days of symptoms antibiotics are not indicated.  I did recommend Covid testing.  She does refuse.  We will treat her symptomatically. Final Clinical Impressions(s) / UC Diagnoses   Final diagnoses:  Viral URI with cough     Discharge Instructions     Take the Tessalon Perles with your Delsym cough medicine Drink lots of fluids Call here or to your primary care if you fail to improve by 10 days   ED Prescriptions    Medication Sig Dispense Auth. Provider   benzonatate (TESSALON) 200 MG capsule Take 1 capsule 2-3 times a day for cough 20 capsule Eustace Moore, MD     PDMP not reviewed this encounter.   Eustace Moore, MD 05/17/20 1415

## 2020-06-25 DIAGNOSIS — R109 Unspecified abdominal pain: Secondary | ICD-10-CM | POA: Diagnosis not present

## 2020-06-25 DIAGNOSIS — R110 Nausea: Secondary | ICD-10-CM | POA: Diagnosis not present

## 2020-06-25 DIAGNOSIS — R1013 Epigastric pain: Secondary | ICD-10-CM | POA: Diagnosis not present

## 2020-06-25 DIAGNOSIS — N926 Irregular menstruation, unspecified: Secondary | ICD-10-CM | POA: Diagnosis not present

## 2020-06-25 DIAGNOSIS — R350 Frequency of micturition: Secondary | ICD-10-CM | POA: Diagnosis not present

## 2020-06-27 ENCOUNTER — Other Ambulatory Visit: Payer: Self-pay

## 2020-06-27 ENCOUNTER — Emergency Department
Admission: EM | Admit: 2020-06-27 | Discharge: 2020-06-28 | Disposition: A | Discharge status: Home / Self Care | Payer: BC Managed Care – PPO | Attending: Emergency Medicine | Admitting: Emergency Medicine

## 2020-06-27 DIAGNOSIS — I820 Budd-Chiari syndrome: Secondary | ICD-10-CM | POA: Diagnosis not present

## 2020-06-27 DIAGNOSIS — R42 Dizziness and giddiness: Secondary | ICD-10-CM | POA: Diagnosis not present

## 2020-06-27 DIAGNOSIS — R519 Headache, unspecified: Secondary | ICD-10-CM | POA: Insufficient documentation

## 2020-06-27 DIAGNOSIS — R0789 Other chest pain: Secondary | ICD-10-CM | POA: Insufficient documentation

## 2020-06-27 DIAGNOSIS — J45909 Unspecified asthma, uncomplicated: Secondary | ICD-10-CM | POA: Diagnosis not present

## 2020-06-27 LAB — COMPREHENSIVE METABOLIC PANEL
ALT: 21 U/L (ref 0–44)
AST: 14 U/L — ABNORMAL LOW (ref 15–41)
Albumin: 3.9 g/dL (ref 3.5–5.0)
Alkaline Phosphatase: 61 U/L (ref 38–126)
Anion gap: 7 (ref 5–15)
BUN: 10 mg/dL (ref 6–20)
CO2: 26 mmol/L (ref 22–32)
Calcium: 8.9 mg/dL (ref 8.9–10.3)
Chloride: 101 mmol/L (ref 98–111)
Creatinine, Ser: 0.69 mg/dL (ref 0.44–1.00)
GFR, Estimated: >60 mL/min (ref >60)
Glucose, Bld: 108 mg/dL — ABNORMAL HIGH (ref 70–99)
Potassium: 3.8 mmol/L (ref 3.5–5.1)
Sodium: 134 mmol/L — ABNORMAL LOW (ref 135–145)
Total Bilirubin: 0.6 mg/dL (ref 0.3–1.2)
Total Protein: 7.2 g/dL (ref 6.5–8.1)

## 2020-06-27 LAB — CBC
HCT: 36.7 % (ref 36.0–46.0)
Hemoglobin: 11.8 g/dL — ABNORMAL LOW (ref 12.0–15.0)
MCH: 28.4 pg (ref 26.0–34.0)
MCHC: 32.2 g/dL (ref 30.0–36.0)
MCV: 88.4 fL (ref 80.0–100.0)
Platelets: 321 10*3/uL (ref 150–400)
RBC: 4.15 MIL/uL (ref 3.87–5.11)
RDW: 13.7 % (ref 11.5–15.5)
WBC: 7.5 10*3/uL (ref 4.0–10.5)
nRBC: 0 % (ref 0.0–0.2)

## 2020-06-27 LAB — URINALYSIS, COMPLETE (UACMP) WITH MICROSCOPIC
Bacteria, UA: NONE SEEN
Bilirubin Urine: NEGATIVE
Glucose, UA: NEGATIVE mg/dL
Hgb urine dipstick: NEGATIVE
Ketones, ur: 5 mg/dL — AB
Leukocytes,Ua: NEGATIVE
Nitrite: NEGATIVE
Protein, ur: NEGATIVE mg/dL
Specific Gravity, Urine: 1.023 (ref 1.005–1.030)
pH: 6 (ref 5.0–8.0)

## 2020-06-27 LAB — POC URINE PREG, ED: Preg Test, Ur: NEGATIVE

## 2020-06-27 NOTE — ED Notes (Signed)
Pt sitting in lobby in no acute distress, texting on phone.

## 2020-06-27 NOTE — ED Triage Notes (Signed)
Pt states she has felt dizzy and lightheaded since noon today. Pt appears in no acute distress. Pt states "my heart rate has been all over the place". No hr variablility noted in triage. Pt states has had back pain, denies headache, chest pain.

## 2020-06-28 ENCOUNTER — Telehealth: Payer: Self-pay | Admitting: Family Medicine

## 2020-06-28 DIAGNOSIS — R42 Dizziness and giddiness: Secondary | ICD-10-CM | POA: Diagnosis not present

## 2020-06-28 LAB — TROPONIN I (HIGH SENSITIVITY): Troponin I (High Sensitivity): <2 ng/L (ref <18)

## 2020-06-28 MED ORDER — KETOROLAC TROMETHAMINE 30 MG/ML IJ SOLN
15.0000 mg | Freq: Once | INTRAMUSCULAR | Status: AC
  Administered 2020-06-28: 15 mg via INTRAVENOUS
  Filled 2020-06-28: qty 1

## 2020-06-28 MED ORDER — SODIUM CHLORIDE 0.9 % IV BOLUS
1000.0000 mL | Freq: Once | INTRAVENOUS | Status: AC
  Administered 2020-06-28: 1000 mL via INTRAVENOUS

## 2020-06-28 MED ORDER — MECLIZINE HCL 25 MG PO TABS: 25.0000 mg | ORAL_TABLET | Freq: Three times a day (TID) | ORAL | 0 refills | Status: DC | PRN

## 2020-06-28 MED ORDER — ACETAMINOPHEN 500 MG PO TABS
1000.0000 mg | ORAL_TABLET | Freq: Once | ORAL | Status: AC
  Administered 2020-06-28: 1000 mg via ORAL
  Filled 2020-06-28: qty 2

## 2020-06-28 MED ORDER — ONDANSETRON HCL 4 MG/2ML IJ SOLN
4.0000 mg | Freq: Once | INTRAMUSCULAR | Status: AC
  Administered 2020-06-28: 4 mg via INTRAVENOUS
  Filled 2020-06-28: qty 2

## 2020-06-28 NOTE — Telephone Encounter (Signed)
ED non observation stay does not qualify for TCM.  CMA to call patient and schedule at appropriate.

## 2020-06-28 NOTE — Telephone Encounter (Signed)
Patient was in the ED on 06/27/20 for dizzy spells. She is requesting a hospital follow, no available appointments at the time of call for this week. Patient stated she was not tested for covid.

## 2020-06-28 NOTE — ED Provider Notes (Signed)
[facility] Emergency Department Provider Note  ____________________________________________   Event Date/Time   First MD Initiated Contact with Patient 06/28/20 0003     (approximate)  I have reviewed the triage vital signs and the nursing notes.   HISTORY  Chief Complaint Dizziness    HPI Angela Mccormick is a 35 y.o. female with history of Chiari malformation who comes in with dizziness.  Patient reports dizziness only with movements that started yesterday, intermittent, worse with movement, better at rest.Patient does report a little bit of chest pain as well as a headache.  Denies any symptoms when sitting still.  Denies a history of vertigo.  No ear pain.  Patient does report a little bit of chest pain that just started as well as a headache.  She typically has headaches from her Chiari malformation that does better with ibuprofen.  She denies any abdominal pain, leg swelling, shortness of breath.  Patient is driving home today.  Denies any neck manipulation or recent car accidents.          Past Medical History:  Diagnosis Date  . Asthma   . Depression   . Gestational diabetes   . History of Chiari malformation   . History of syncope   . Migraine     Patient Active Problem List   Diagnosis Date Noted  . Congestion of nasal sinus 04/19/2020  . Rash 04/19/2020  . Encounter for general adult medical examination with abnormal findings 03/05/2020  . Depression, major, single episode, moderate (HCC) 03/05/2020  . Obesity (BMI 30.0-34.9) 03/05/2020  . Asthma 12/08/2019  . Lightheadedness 09/08/2019  . Rectal bleeding 03/04/2018  . Nodule of neck 11/01/2016  . Chiari malformation (HCC) 09/26/2012  . Migraines 09/26/2012    Past Surgical History:  Procedure Laterality Date  . CESAREAN SECTION N/A 12/17/2017   Procedure: CESAREAN SECTION;  Surgeon: Vena Austria, MD;  Location: [facility] ORS;  Service: Obstetrics;  Laterality: N/A;  .  CESAREAN SECTION WITH BILATERAL TUBAL LIGATION  04/06/2019   Procedure: CESAREAN SECTION WITH BILATERAL TUBAL LIGATION;  Surgeon: Vena Austria, MD;  Location: [facility] ORS;  Service: Obstetrics;;  . Wenda Low SIGMOIDOSCOPY N/A 03/12/2018   Procedure: FLEXIBLE SIGMOIDOSCOPY;  Surgeon: Toney Reil, MD;  Location: [facility] ENDOSCOPY;  Service: Gastroenterology;  Laterality: N/A;  . NO PAST SURGERIES    . WISDOM TOOTH EXTRACTION      Prior to Admission medications   Medication Sig Start Date End Date Taking? Authorizing Provider  albuterol (VENTOLIN HFA) 108 (90 Base) MCG/ACT inhaler Inhale 2 puffs into the lungs every 6 (six) hours as needed for wheezing or shortness of breath. 12/08/19   Glori Luis, MD  amoxicillin-clavulanate (AUGMENTIN) 875-125 MG tablet Take 1 tablet by mouth 2 (two) times daily. 04/19/20   Glori Luis, MD  benzonatate (TESSALON) 200 MG capsule Take 1 capsule 2-3 times a day for cough 05/16/20   Eustace Moore, MD  meloxicam (MOBIC) 15 MG tablet Take 15 mg by mouth daily. 11/18/19   [provider]  Multiple Vitamins-Minerals (EQ MULTIVITAMINS ADULT GUMMY PO) Take by mouth daily.    [provider]  nystatin (MYCOSTATIN/NYSTOP) powder Apply 1 application topically 3 (three) times daily. 04/19/20   Glori Luis, MD  sertraline (ZOLOFT) 100 MG tablet Take 1 tablet (100 mg total) by mouth daily. 04/19/20   Glori Luis, MD    Allergies Sumatriptan succinate and Other  Family History  Problem Relation Age of Onset  .  Colon cancer Mother   . Diabetes Mother   . Stroke Mother   . Diabetes Father   . Stroke Maternal Grandfather   . Heart disease Maternal Grandfather        CABG  . Stroke Maternal Aunt     Social History Social History   Tobacco Use  . Smoking status: Never Smoker  . Smokeless tobacco: Never Used  Vaping Use  . Vaping Use: Never used  Substance Use Topics  . Alcohol use: Not Currently    Comment: 1  drink/year  . Drug use: No      Review of Systems Constitutional: No fever/chills Eyes: No visual changes. ENT: No sore throat. Cardiovascular: Positive chest pain Respiratory: Denies shortness of breath. Gastrointestinal: No abdominal pain.  No nausea, no vomiting.  No diarrhea.  No constipation. Genitourinary: Negative for dysuria. Musculoskeletal: Negative for back pain. Skin: Negative for rash. Neurological: Negative for headaches, focal weakness or numbness.  Positive dizziness, mild headache All other ROS negative ____________________________________________   PHYSICAL EXAM:  VITAL SIGNS: ED Triage Vitals  Enc Vitals Group     BP 06/27/20 2218 119/79     Pulse Rate 06/27/20 2218 72     Resp 06/27/20 2218 16     Temp 06/27/20 2218 97.7 F (36.5 C)     Temp Source 06/27/20 2218 Oral     SpO2 06/27/20 2218 100 %     Weight 06/27/20 2219 203 lb (92.1 kg)     Height 06/27/20 2219 5\' 3"  (1.6 m)     Head Circumference --      Peak Flow --      Pain Score 06/27/20 2219 4     Pain Loc --      Pain Edu? --      Excl. in GC? --     Constitutional: Alert and oriented. Well appearing and in no acute distress. Eyes: Conjunctivae are normal. EOMI. Head: Atraumatic. Nose: No congestion/rhinnorhea. Mouth/Throat: Mucous membranes are moist.   Neck: No stridor. Trachea Midline. FROM Cardiovascular: Normal rate, regular rhythm. Grossly normal heart sounds.  Good peripheral circulation. Respiratory: Normal respiratory effort.  No retractions. Lungs CTAB. Gastrointestinal: Soft and nontender. No distention. No abdominal bruits.  Musculoskeletal: No lower extremity tenderness nor edema.  No joint effusions. Neurologic:  Normal speech and language. No gross focal neurologic deficits are appreciated.  Cranial nerves II to XII are intact.  Equal strength in arms and legs.  Finger-to-nose intact.  Able to ambulate without any ataxia.  When she bends over she feels a little  dizzy Skin:  Skin is warm, dry and intact. No rash noted. Psychiatric: Mood and affect are normal. Speech and behavior are normal. GU: Deferred   ____________________________________________   LABS (all labs ordered are listed, but only abnormal results are displayed)  Labs Reviewed  CBC - Abnormal; Notable for the following components:      Result Value   Hemoglobin 11.8 (*)    All other components within normal limits  URINALYSIS, COMPLETE (UACMP) WITH MICROSCOPIC - Abnormal; Notable for the following components:   Color, Urine YELLOW (*)    APPearance CLEAR (*)    Ketones, ur 5 (*)    All other components within normal limits  COMPREHENSIVE METABOLIC PANEL - Abnormal; Notable for the following components:   Sodium 134 (*)    Glucose, Bld 108 (*)    AST 14 (*)    All other components within normal limits  POC URINE PREG, ED  ____________________________________________   ED ECG REPORT I, Concha Se, the attending physician, personally viewed and interpreted this ECG.  Normal sinus rate of 72, no ST elevation, T wave inversion in lead III, normal intervals with sinus arrhythmia.  Looks similar to prior EKG ____________________________________________   PROCEDURES  Procedure(s) performed (including Critical Care):  Procedures   ____________________________________________   INITIAL IMPRESSION / ASSESSMENT AND PLAN / ED COURSE  WILLIAM LASKE was evaluated in Emergency Department on 06/28/2020 for the symptoms described in the history of present illness. She was evaluated in the context of the global COVID-19 pandemic, which necessitated consideration that the patient might be at risk for infection with the SARS-CoV-2 virus that causes COVID-19. Institutional protocols and algorithms that pertain to the evaluation of patients at risk for COVID-19 are in a state of rapid change based on information released by regulatory bodies including the CDC and federal and  state organizations. These policies and algorithms were followed during the patient's care in the ED.    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  No dizziness at rest mostly when she bends over.  Most likely secondary to some dehydration.  Patient also reports a mild headache and will treat this.  Patient states that her this is her baseline headaches have low suspicion for other acute neurological process.  Her neuro exam is intact.  No signs of posterior stroke.  No risk factors for dissection.  Will do cardiac markers to rule out ACS with a low suspicion.  No shortness of breath and low suspicion for PE.  Could be secondary to vertigo given the dizziness occurs with head position changes labs ordered to evaluate for Electra abnormalities, AKI.  Patient given fluids, Toradol, Tylenol, Zofran.  Pregnancy test was negative No signs of significant anemia UA without evidence of UTI No signs of electrolyte abnormalities Cardiac marker rules out ACS  On reevaluation patient is feeling much better.  She states that her symptoms are minimal at this time.  Discussed with patient that she can trial some meclizine and increasing her fluid intake but she is needs to follow-up with her neurology doctor for Budd-Chiari to make sure that this is not related to her history of Budd-Chiari.  At this time I have low suspicion for acute neurological process given her reassuring neurological exam.  Patient expressed understanding.  She understands that if her symptoms are to worsen at all that she should return to the ER for repeat evaluation.  I discussed the provisional nature of ED diagnosis, the treatment so far, the ongoing plan of care, follow up appointments and return precautions with the patient and any family or support people present. They expressed understanding and agreed with the plan, discharged home.           ____________________________________________   FINAL CLINICAL IMPRESSION(S) / ED DIAGNOSES   Final diagnoses:  Dizziness      MEDICATIONS GIVEN DURING THIS VISIT:  Medications  sodium chloride 0.9 % bolus 1,000 mL (1,000 mLs Intravenous New Bag/Given 06/28/20 0133)  ondansetron (ZOFRAN) injection 4 mg (4 mg Intravenous Given 06/28/20 0133)  acetaminophen (TYLENOL) tablet 1,000 mg (1,000 mg Oral Given 06/28/20 0109)  ketorolac (TORADOL) 30 MG/ML injection 15 mg (15 mg Intravenous Given 06/28/20 0134)     ED Discharge Orders         Ordered    meclizine (ANTIVERT) 25 MG tablet  3 times daily PRN  06/28/20 0301           Note:  This document was prepared using Dragon voice recognition software and may include unintentional dictation errors.   Concha Se, MD 06/28/20 [phone]

## 2020-06-28 NOTE — ED Notes (Signed)
Pt states she has been dizzy since lunch time, yesterday. Pt denies having anything thing to drink or eat since dizziness started.

## 2020-06-28 NOTE — Discharge Instructions (Addendum)
Your work-up was reassuring.  Drink plenty of fluids and take the meclizine to help with dizziness.  You should call your doctor who helps take care of your chiari malformation to see if they need to do any further work-up to make sure that is not related to this.  If her symptoms worsen at all you start having difficulty walking or any other concerns please return to the ER for repeat evaluation.

## 2020-06-30 NOTE — Telephone Encounter (Signed)
Called and spoke with patient and scheduled her a visit on Friday.  Shamecka Hocutt,cma

## 2020-06-30 NOTE — Telephone Encounter (Signed)
Patient called and is upset that no one called about her hospital follow up. There are still no appointments at the time of call with Dr. Birdie Sons this week.

## 2020-07-01 ENCOUNTER — Ambulatory Visit (INDEPENDENT_AMBULATORY_CARE_PROVIDER_SITE_OTHER): Payer: BC Managed Care – PPO | Admitting: Family Medicine

## 2020-07-01 ENCOUNTER — Encounter: Payer: Self-pay | Admitting: Family Medicine

## 2020-07-01 ENCOUNTER — Other Ambulatory Visit: Payer: Self-pay

## 2020-07-01 VITALS — BP 90/60 | HR 89 | Temp 98.6°F | Ht 63.0 in | Wt 203.8 lb

## 2020-07-01 DIAGNOSIS — R42 Dizziness and giddiness: Secondary | ICD-10-CM

## 2020-07-01 DIAGNOSIS — H9311 Tinnitus, right ear: Secondary | ICD-10-CM

## 2020-07-01 DIAGNOSIS — N926 Irregular menstruation, unspecified: Secondary | ICD-10-CM | POA: Diagnosis not present

## 2020-07-01 LAB — TSH: TSH: 2.93 u[IU]/mL (ref 0.35–4.50)

## 2020-07-01 NOTE — Progress Notes (Signed)
[name], MD Phone: [phone]  Angela Mccormick is a 35 y.o. female who presents today for f/u.  Lightheadedness: This has been going on intermittently for a month. Got worse this past Sunday. Symptoms occur with head movement and with bending over and standing up. No hearing issues. Has an odd sound in her right ear. No ear fullness. No vertigo. No cough or congestion. She is drinking plenty of fluids. Her urine is light yellow. She reports checking her heart rate on a pulse ox and it was 30 at 1 point yesterday. No syncopal episodes. She had similar symptoms last year and saw cardiology and she had a negative work-up. Symptoms possibly orthostatic. She also saw neurology and had negative tilt table test. She was seen on 06/27/2020 for this dizziness. She had an EKG with no acute changes. Labs were unremarkable for cause.  Late menstrual cycle: Patient notes typically having menstrual cycles once monthly lasting for 2 days with heavy bleeding. She was due to have her cycle at the end of January and now she spotted for about a week though did not have her typical cycle. She has had multiple negative urine pregnancy tests and does report a negative blood pregnancy test.  She is status post tubal ligation.  Social History   Tobacco Use  Smoking Status Never Smoker  Smokeless Tobacco Never Used    Current Outpatient Medications on File Prior to Visit  Medication Sig Dispense Refill  . albuterol (VENTOLIN HFA) 108 (90 Base) MCG/ACT inhaler Inhale 2 puffs into the lungs every 6 (six) hours as needed for wheezing or shortness of breath. 8 g 0  . meclizine (ANTIVERT) 25 MG tablet Take 1 tablet (25 mg total) by mouth 3 (three) times daily as needed for dizziness. 30 tablet 0  . Multiple Vitamins-Minerals (EQ MULTIVITAMINS ADULT GUMMY PO) Take by mouth daily.    Marland Kitchen nystatin (MYCOSTATIN/NYSTOP) powder Apply 1 application topically 3 (three) times daily. 15 g 0  . sertraline (ZOLOFT) 100 MG  tablet Take 1 tablet (100 mg total) by mouth daily. 30 tablet 2   No current facility-administered medications on file prior to visit.     ROS see history of present illness  Objective  Physical Exam Vitals:   07/01/20 1020  BP: 90/60  Pulse: 89  Temp: 98.6 F (37 C)  SpO2: 99%   Laying blood pressure 121/75 pulse 80 Sitting blood pressure 112/70 pulse 89 Standing blood pressure 119/69 pulse 90  BP Readings from Last 3 Encounters:  07/01/20 90/60  06/28/20 (!) 116/53  05/16/20 121/72   Wt Readings from Last 3 Encounters:  07/01/20 203 lb 12.8 oz (92.4 kg)  06/27/20 203 lb (92.1 kg)  04/19/20 204 lb 3.2 oz (92.6 kg)    Physical Exam Constitutional:      General: She is not in acute distress.    Appearance: She is not diaphoretic.  Cardiovascular:     Rate and Rhythm: Normal rate and regular rhythm.     Heart sounds: Normal heart sounds.  Pulmonary:     Effort: Pulmonary effort is normal.     Breath sounds: Normal breath sounds.  Musculoskeletal:        General: No edema.     Right lower leg: No edema.     Left lower leg: No edema.  Skin:    General: Skin is warm and dry.  Neurological:     Mental Status: She is alert.      Assessment/Plan: Please see  individual problem list.  Problem List Items Addressed This Visit    Abnormal menses - Primary    I suspect the spotting she had represented her menstrual cycle.  Negative urine pregnancy test today.  We will check TSH and prolactin levels.  She'll monitor.      Relevant Orders   TSH   Prolactin   Lightheadedness    Patient with recurrent issues of lightheadedness.  Potentially related to orthostasis though not orthostatic today.  Given reported home heart rates we will have her see cardiology for follow-up.  Given reported unilateral tinnitus we'll have her see ENT.  Advised to seek medical attention for syncope or worsening symptoms.      Relevant Orders   Ambulatory referral to Cardiology     Other Visit Diagnoses    Tinnitus of right ear       Relevant Orders   Ambulatory referral to ENT       This visit occurred during the SARS-CoV-2 public health emergency.  Safety protocols were in place, including screening questions prior to the visit, additional usage of staff PPE, and extensive cleaning of exam room while observing appropriate contact time as indicated for disinfecting solutions.    [name], MD [facility]

## 2020-07-01 NOTE — Patient Instructions (Addendum)
Nice to see you. We will get you to see cardiology. We will get some additional lab work. Please drink plenty of fluids. We can have you see ENT as well for your sound in your ear. If you pass out please seek medical attention in the emergency department.

## 2020-07-01 NOTE — Assessment & Plan Note (Signed)
I suspect the spotting she had represented her menstrual cycle.  Negative urine pregnancy test today.  We will check TSH and prolactin levels.  She'll monitor.

## 2020-07-01 NOTE — Assessment & Plan Note (Signed)
Patient with recurrent issues of lightheadedness.  Potentially related to orthostasis though not orthostatic today.  Given reported home heart rates we will have her see cardiology for follow-up.  Given reported unilateral tinnitus we'll have her see ENT.  Advised to seek medical attention for syncope or worsening symptoms.

## 2020-07-02 LAB — PROLACTIN: Prolactin: 6.9 ng/mL

## 2020-07-07 ENCOUNTER — Other Ambulatory Visit: Payer: Self-pay

## 2020-07-07 ENCOUNTER — Ambulatory Visit (INDEPENDENT_AMBULATORY_CARE_PROVIDER_SITE_OTHER): Payer: BC Managed Care – PPO | Admitting: Internal Medicine

## 2020-07-07 ENCOUNTER — Encounter: Payer: Self-pay | Admitting: Internal Medicine

## 2020-07-07 ENCOUNTER — Encounter: Payer: Self-pay | Admitting: *Deleted

## 2020-07-07 VITALS — BP 105/68 | HR 73 | Ht 63.0 in | Wt 203.0 lb

## 2020-07-07 DIAGNOSIS — R42 Dizziness and giddiness: Secondary | ICD-10-CM

## 2020-07-07 NOTE — Patient Instructions (Signed)
Medication Instructions:  Your physician recommends that you continue on your current medications as directed. Please refer to the Current Medication list given to you today.  *If you need a refill on your cardiac medications before your next appointment, please call your pharmacy*  Follow-Up: At [facility], you and your health needs are our priority.  As part of our continuing mission to provide you with exceptional heart care, we have created designated Provider Care Teams.  These Care Teams include your primary Cardiologist (physician) and Advanced Practice Providers (APPs -  Physician Assistants and Nurse Practitioners) who all work together to provide you with the care you need, when you need it.  We recommend signing up for the patient portal called "MyChart".  Sign up information is provided on this After Visit Summary.  MyChart is used to connect with patients for Virtual Visits (Telemedicine).  Patients are able to view lab/test results, encounter notes, upcoming appointments, etc.  Non-urgent messages can be sent to your provider as well.   To learn more about what you can do with MyChart, go to [email or web address].    Your next appointment:   As needed.  The format for your next appointment:   In Person  Provider:   You may see Yvonne Kendall, MD or one of the following Advanced Practice Providers on your designated Care Team:    Nicolasa Ducking, NP  Eula Listen, PA-C  Marisue Ivan, PA-C  [place], [place]  Gillian Shields, NP

## 2020-07-07 NOTE — Progress Notes (Signed)
Follow-up Outpatient Visit Date: 07/07/2020  Primary Care Provider: Glori Luis, MD [address] [place] [address]  Chief Complaint: Dizziness and headaches  HPI:  Ms. Mcquade is a 35 y.o. female with history of Chiari malformation with chronic headaches syncope (2014), asthma, and depression, who presents for follow-up of dizziness.  She was last seen in our office in 10/2019.  Prior event monitor last year showed predominantly sinus rhythm with rare PACs and PVCs as well as a brief episode of Mobitz type I second-degree AV block similar to prior monitor that the patient had worn at [facility].  Echo was normal.  No further work-up was recommended.  The patient was encouraged to stay well-hydrated.  She presented to the [facility] emergency department 10 days ago complaining of dizziness, thought to be due to dehydration.  Today, Ms. Azzaro reports that her dizziness has been an ongoing problem.  She is experiencing pain along the top of her head associated with an off-balance feeling.  She also has unusual sensations about her ear at times, which is prompted referral to ENT by her PCP.  She has not followed up with her neurologist and is considering switching providers.  She is trying to stay well-hydrated.  She drinks at least 2 caffeinated sodas per day.  She has not had any chest pain, shortness of breath, palpitations, or edema.  Her dizziness has not improved with meclizine; in fact she feels like this may have made it a little worse.  --------------------------------------------------------------------------------------------------  Past Medical History:  Diagnosis Date  . Asthma   . Depression   . Gestational diabetes   . History of Chiari malformation   . History of syncope   . Migraine    Past Surgical History:  Procedure Laterality Date  . CESAREAN SECTION N/A 12/17/2017   Procedure: CESAREAN SECTION;  Surgeon: Vena Austria, MD;  Location: [facility] ORS;  Service:  Obstetrics;  Laterality: N/A;  . CESAREAN SECTION WITH BILATERAL TUBAL LIGATION  04/06/2019   Procedure: CESAREAN SECTION WITH BILATERAL TUBAL LIGATION;  Surgeon: Vena Austria, MD;  Location: [facility] ORS;  Service: Obstetrics;;  . Wenda Low SIGMOIDOSCOPY N/A 03/12/2018   Procedure: FLEXIBLE SIGMOIDOSCOPY;  Surgeon: Toney Reil, MD;  Location: [facility] ENDOSCOPY;  Service: Gastroenterology;  Laterality: N/A;  . NO PAST SURGERIES    . WISDOM TOOTH EXTRACTION      Current Meds  Medication Sig  . albuterol (VENTOLIN HFA) 108 (90 Base) MCG/ACT inhaler Inhale 2 puffs into the lungs every 6 (six) hours as needed for wheezing or shortness of breath.  . meclizine (ANTIVERT) 25 MG tablet Take 1 tablet (25 mg total) by mouth 3 (three) times daily as needed for dizziness.  . Multiple Vitamins-Minerals (EQ MULTIVITAMINS ADULT GUMMY PO) Take one gummy by mouth daily.  Marland Kitchen nystatin (MYCOSTATIN/NYSTOP) powder Apply 1 application topically 3 (three) times daily.  . sertraline (ZOLOFT) 100 MG tablet Take 1 tablet (100 mg total) by mouth daily.    Allergies: Sumatriptan succinate and Other  Social History   Tobacco Use  . Smoking status: Never Smoker  . Smokeless tobacco: Never Used  Vaping Use  . Vaping Use: Never used  Substance Use Topics  . Alcohol use: Not Currently    Comment: 1 drink/year  . Drug use: No    Family History  Problem Relation Age of Onset  . Colon cancer Mother   . Diabetes Mother   . Stroke Mother   . Diabetes Father   .  Stroke Maternal Grandfather   . Heart disease Maternal Grandfather        CABG  . Stroke Maternal Aunt     Review of Systems: A 12-system review of systems was performed and was negative except as noted in the HPI.  --------------------------------------------------------------------------------------------------  Physical Exam: BP 105/68 (BP Location: Left Arm, Patient Position: Sitting, Cuff Size: Normal)   Pulse 73   Ht 5\' 3"  (1.6 m)    Wt 203 lb (92.1 kg)   SpO2 98%   BMI 35.96 kg/m   Position Blood pressure (mmHg) Heart rate (bpm)  Lying  113/75  76  Sitting  106/72  86  Standing  118/87  88  Standing (3 minutes)  112/75  87    General:  NAD. Neck: No JVD or HJR. Lungs: Clear to auscultation bilaterally without wheezes or crackles. Heart: Regular rate and rhythm without murmurs, rubs, or gallops. Abdomen: Soft, nontender, nondistended. Extremities: No lower extremity edema.  EKG: Normal sinus rhythm without abnormality.  Lab Results  Component Value Date   WBC 7.5 06/27/2020   HGB 11.8 (L) 06/27/2020   HCT 36.7 06/27/2020   MCV 88.4 06/27/2020   PLT 321 06/27/2020    Lab Results  Component Value Date   NA 134 (L) 06/27/2020   K 3.8 06/27/2020   CL 101 06/27/2020   CO2 26 06/27/2020   BUN 10 06/27/2020   CREATININE 0.69 06/27/2020   GLUCOSE 108 (H) 06/27/2020   ALT 21 06/27/2020    Lab Results  Component Value Date   CHOL 141 03/05/2020   HDL 43.20 03/05/2020   LDLCALC 77 03/05/2020   TRIG 107.0 03/05/2020   CHOLHDL 3 03/05/2020    --------------------------------------------------------------------------------------------------  ASSESSMENT AND PLAN: Dizziness: Symptoms have been longstanding and are not consistent with cardiac source.  Orthostatic vital signs today are normal.  EKG and physical exam are unrevealing.  Prior work-up last year was notable for normal echo.  Monitor showed rare PACs and PVCs as well as brief episode of Mobitz type I first-degree AV block.  I have encouraged Ms. Platten to stay well-hydrated and to minimize her caffeine consumption.  No further cardiac work-up is recommended at this time.  Sleep study could be considered given the patient's obesity and transient Mobitz type I first-degree AV block.  I will defer ordering this to Dr. 03/07/2020 if he feels it is appropriate.  I have advised Ms. Lynch to follow-up with a neurologist as well as to move forward with  the ENT consultation placed by Dr. Birdie Sons.  Follow-up: Return to clinic as needed.  Birdie Sons, MD 07/07/2020 8:27 AM

## 2020-07-23 ENCOUNTER — Other Ambulatory Visit: Payer: Self-pay | Admitting: Otolaryngology

## 2020-07-23 DIAGNOSIS — H9313 Tinnitus, bilateral: Secondary | ICD-10-CM | POA: Diagnosis not present

## 2020-07-23 DIAGNOSIS — Q0700 Arnold-Chiari syndrome without spina bifida or hydrocephalus: Secondary | ICD-10-CM

## 2020-07-23 DIAGNOSIS — R42 Dizziness and giddiness: Secondary | ICD-10-CM | POA: Diagnosis not present

## 2020-07-23 DIAGNOSIS — H9311 Tinnitus, right ear: Secondary | ICD-10-CM

## 2020-08-05 ENCOUNTER — Other Ambulatory Visit: Payer: Self-pay

## 2020-08-05 ENCOUNTER — Ambulatory Visit
Admission: RE | Admit: 2020-08-05 | Discharge: 2020-08-05 | Disposition: A | Discharge status: Home / Self Care | Payer: BC Managed Care – PPO | Source: Ambulatory Visit | Attending: Otolaryngology | Admitting: Otolaryngology

## 2020-08-05 DIAGNOSIS — R519 Headache, unspecified: Secondary | ICD-10-CM | POA: Diagnosis not present

## 2020-08-05 DIAGNOSIS — Q0700 Arnold-Chiari syndrome without spina bifida or hydrocephalus: Secondary | ICD-10-CM | POA: Diagnosis not present

## 2020-08-05 DIAGNOSIS — R42 Dizziness and giddiness: Secondary | ICD-10-CM

## 2020-08-05 DIAGNOSIS — H9311 Tinnitus, right ear: Secondary | ICD-10-CM

## 2020-08-05 MED ORDER — GADOBUTROL 1 MMOL/ML IV SOLN
7.5000 mL | Freq: Once | INTRAVENOUS | Status: AC | PRN
  Administered 2020-08-05: 7.5 mL via INTRAVENOUS

## 2020-08-06 DIAGNOSIS — M7542 Impingement syndrome of left shoulder: Secondary | ICD-10-CM | POA: Diagnosis not present

## 2020-08-06 DIAGNOSIS — M25512 Pain in left shoulder: Secondary | ICD-10-CM | POA: Diagnosis not present

## 2020-08-06 DIAGNOSIS — M778 Other enthesopathies, not elsewhere classified: Secondary | ICD-10-CM | POA: Diagnosis not present

## 2020-09-13 ENCOUNTER — Ambulatory Visit
Admission: EM | Admit: 2020-09-13 | Discharge: 2020-09-13 | Disposition: A | Discharge status: Home / Self Care | Payer: BC Managed Care – PPO | Attending: Emergency Medicine | Admitting: Emergency Medicine

## 2020-09-13 DIAGNOSIS — J302 Other seasonal allergic rhinitis: Secondary | ICD-10-CM | POA: Diagnosis not present

## 2020-09-13 DIAGNOSIS — J029 Acute pharyngitis, unspecified: Secondary | ICD-10-CM | POA: Insufficient documentation

## 2020-09-13 NOTE — Discharge Instructions (Signed)
Your rapid strep test is negative.  A throat culture is pending; we will call you if it is positive requiring treatment.    Take Tylenol or ibuprofen as needed for fever or discomfort.  Take over-the-counter Zyrtec or Allegra.  Also use over-the-counter Flonase nasal spray.    Follow up with your primary care provider if your symptoms are not improving.

## 2020-09-13 NOTE — ED Triage Notes (Signed)
Pt reports sore throat x 8 days, ear fullness (R>L) x 2-3 days.  + intermittent HA and postnasal drip.  Denies fever, cough.    Mucinex and Tylenol at home.

## 2020-09-13 NOTE — ED Provider Notes (Signed)
Angela Mccormick    CSN: [id number] Arrival date & time: 09/13/20  0854      History   Chief Complaint Chief Complaint  Patient presents with  . Sore Throat    HPI Angela Mccormick is a 35 y.o. female.   Patient presents with sore throat, ear fullness, postnasal drip x1 week.  She denies fever, rash, cough, shortness of breath, vomiting, diarrhea, or other symptoms.  Treatment attempted at home with Tylenol and Mucinex.  Her medical history includes Chiari malformation, dizziness, migraine headaches, syncope, asthma, obesity, depression, gestational diabetes.  The history is provided by the patient and medical records.    Past Medical History:  Diagnosis Date  . Asthma   . Depression   . Gestational diabetes   . History of Chiari malformation   . History of syncope   . Migraine     Patient Active Problem List   Diagnosis Date Noted  . Abnormal menses 07/01/2020  . Congestion of nasal sinus 04/19/2020  . Rash 04/19/2020  . Encounter for general adult medical examination with abnormal findings 03/05/2020  . Depression, major, single episode, moderate (HCC) 03/05/2020  . Obesity (BMI 30.0-34.9) 03/05/2020  . Asthma 12/08/2019  . Dizziness 09/08/2019  . Rectal bleeding 03/04/2018  . Nodule of neck 11/01/2016  . Chiari malformation (HCC) 09/26/2012  . Migraines 09/26/2012    Past Surgical History:  Procedure Laterality Date  . CESAREAN SECTION N/A 12/17/2017   Procedure: CESAREAN SECTION;  Surgeon: Vena Austria, MD;  Location: [facility] ORS;  Service: Obstetrics;  Laterality: N/A;  . CESAREAN SECTION WITH BILATERAL TUBAL LIGATION  04/06/2019   Procedure: CESAREAN SECTION WITH BILATERAL TUBAL LIGATION;  Surgeon: Vena Austria, MD;  Location: [facility] ORS;  Service: Obstetrics;;  . Wenda Low SIGMOIDOSCOPY N/A 03/12/2018   Procedure: FLEXIBLE SIGMOIDOSCOPY;  Surgeon: Toney Reil, MD;  Location: [facility] ENDOSCOPY;  Service: Gastroenterology;  Laterality: N/A;  .  NO PAST SURGERIES    . WISDOM TOOTH EXTRACTION      OB History    Gravida  2   Para  2   Term  0   Preterm  2   AB      Living  2     SAB      IAB      Ectopic      Multiple  0   Live Births  2            Home Medications    Prior to Admission medications   Medication Sig Start Date End Date Taking? Authorizing Provider  albuterol (VENTOLIN HFA) 108 (90 Base) MCG/ACT inhaler Inhale 2 puffs into the lungs every 6 (six) hours as needed for wheezing or shortness of breath. 12/08/19  Yes Glori Luis, MD  meclizine (ANTIVERT) 25 MG tablet Take 1 tablet (25 mg total) by mouth 3 (three) times daily as needed for dizziness. 06/28/20  Yes Concha Se, MD  Multiple Vitamins-Minerals (EQ MULTIVITAMINS ADULT GUMMY PO) Take one gummy by mouth daily.   Yes [provider]  nystatin (MYCOSTATIN/NYSTOP) powder Apply 1 application topically 3 (three) times daily. 04/19/20  Yes Glori Luis, MD  sertraline (ZOLOFT) 100 MG tablet Take 1 tablet (100 mg total) by mouth daily. 04/19/20  Yes Glori Luis, MD    Family History Family History  Problem Relation Age of Onset  . Colon cancer Mother   . Diabetes Mother   . Stroke Mother   . Diabetes Father   .  Stroke Maternal Grandfather   . Heart disease Maternal Grandfather        CABG  . Stroke Maternal Aunt     Social History Social History   Tobacco Use  . Smoking status: Never Smoker  . Smokeless tobacco: Never Used  Vaping Use  . Vaping Use: Never used  Substance Use Topics  . Alcohol use: Not Currently    Comment: 1 drink/year  . Drug use: No     Allergies   Sumatriptan succinate and Other   Review of Systems Review of Systems  Constitutional: Negative for chills and fever.  HENT: Positive for ear pain, postnasal drip and sore throat. Negative for congestion and rhinorrhea.   Eyes: Negative for pain and visual disturbance.  Respiratory: Negative for cough and shortness of breath.    Cardiovascular: Negative for chest pain and palpitations.  Gastrointestinal: Negative for abdominal pain, diarrhea and vomiting.  Genitourinary: Negative for dysuria and hematuria.  Musculoskeletal: Negative for arthralgias and back pain.  Skin: Negative for color change and rash.  Neurological: Negative for seizures and syncope.  All other systems reviewed and are negative.    Physical Exam Triage Vital Signs ED Triage Vitals  Enc Vitals Group     BP      Pulse      Resp      Temp      Temp src      SpO2      Weight      Height      Head Circumference      Peak Flow      Pain Score      Pain Loc      Pain Edu?      Excl. in GC?    No data found.  Updated Vital Signs BP 102/72 (BP Location: Left Arm)   Pulse 76   Temp 98.1 F (36.7 C) (Oral)   Resp 18   LMP 09/03/2020   SpO2 99%   Breastfeeding No   Visual Acuity Right Eye Distance:   Left Eye Distance:   Bilateral Distance:    Right Eye Near:   Left Eye Near:    Bilateral Near:     Physical Exam Vitals and nursing note reviewed.  Constitutional:      General: She is not in acute distress.    Appearance: She is well-developed. She is not ill-appearing.  HENT:     Head: Normocephalic and atraumatic.     Right Ear: Tympanic membrane normal.     Left Ear: Tympanic membrane normal.     Nose: Nose normal.     Mouth/Throat:     Mouth: Mucous membranes are moist.     Pharynx: Posterior oropharyngeal erythema present.  Eyes:     Conjunctiva/sclera: Conjunctivae normal.  Cardiovascular:     Rate and Rhythm: Normal rate and regular rhythm.     Heart sounds: Normal heart sounds.  Pulmonary:     Effort: Pulmonary effort is normal. No respiratory distress.     Breath sounds: Normal breath sounds.  Abdominal:     Palpations: Abdomen is soft.     Tenderness: There is no abdominal tenderness.  Musculoskeletal:     Cervical back: Neck supple.  Skin:    General: Skin is warm and dry.     Findings: No  rash.  Neurological:     General: No focal deficit present.     Mental Status: She is alert and oriented to person, place, and  time.  Psychiatric:        Mood and Affect: Mood normal.        Behavior: Behavior normal.      UC Treatments / Results  Labs (all labs ordered are listed, but only abnormal results are displayed) Labs Reviewed  CULTURE, GROUP A STREP [facility])  POCT RAPID STREP A (OFFICE)    EKG   Radiology No results found.  Procedures Procedures (including critical care time)  Medications Ordered in UC Medications - No data to display  Initial Impression / Assessment and Plan / UC Course  I have reviewed the triage vital signs and the nursing notes.  Pertinent labs & imaging results that were available during my care of the patient were reviewed by me and considered in my medical decision making (see chart for details).   Sore throat, seasonal allergies.  Rapid strep negative; culture pending.  Discussed symptomatic treatment including Tylenol or ibuprofen, Zyrtec or Allegra, Flonase nasal spray.  Instructed patient to follow-up with her PCP if her symptoms are not improving.  She agrees to plan of care.   Final Clinical Impressions(s) / UC Diagnoses   Final diagnoses:  Sore throat  Seasonal allergies     Discharge Instructions     Your rapid strep test is negative.  A throat culture is pending; we will call you if it is positive requiring treatment.    Take Tylenol or ibuprofen as needed for fever or discomfort.  Take over-the-counter Zyrtec or Allegra.  Also use over-the-counter Flonase nasal spray.    Follow up with your primary care provider if your symptoms are not improving.        ED Prescriptions    None     PDMP not reviewed this encounter.   Mickie Bail, NP 09/13/20 [phone]

## 2020-09-15 LAB — CULTURE, GROUP A STREP (THRC)

## 2020-10-01 ENCOUNTER — Ambulatory Visit: Payer: BC Managed Care – PPO | Admitting: Family Medicine

## 2020-10-08 ENCOUNTER — Ambulatory Visit (INDEPENDENT_AMBULATORY_CARE_PROVIDER_SITE_OTHER): Payer: BC Managed Care – PPO | Admitting: Family Medicine

## 2020-10-08 ENCOUNTER — Other Ambulatory Visit: Payer: Self-pay

## 2020-10-08 ENCOUNTER — Encounter: Payer: Self-pay | Admitting: Family Medicine

## 2020-10-08 DIAGNOSIS — R21 Rash and other nonspecific skin eruption: Secondary | ICD-10-CM

## 2020-10-08 DIAGNOSIS — G4710 Hypersomnia, unspecified: Secondary | ICD-10-CM | POA: Diagnosis not present

## 2020-10-08 DIAGNOSIS — F321 Major depressive disorder, single episode, moderate: Secondary | ICD-10-CM

## 2020-10-08 DIAGNOSIS — R42 Dizziness and giddiness: Secondary | ICD-10-CM

## 2020-10-08 MED ORDER — NYSTATIN 100000 UNIT/GM EX POWD: 1.0000 "application " | Freq: Three times a day (TID) | CUTANEOUS | 0 refills | Status: DC

## 2020-10-08 NOTE — Assessment & Plan Note (Signed)
Chronic ongoing issue.  No specific cause has been found.  She is waiting on an appointment from neurology.  I encouraged her to contact them to get this scheduled.

## 2020-10-08 NOTE — Progress Notes (Signed)
[name], MD Phone: [phone]  Angela Mccormick is a 35 y.o. female who presents today for f/u.  Lightheadedness: Patient continues to have intermittent issues with this.  It can happen at any time.  She saw cardiology and they noted it was not cardiac related.  They did recommend a sleep study given her intermittent Mobitz type I heart block in addition to her weight.  She also saw ENT and she had an MRI that did not reveal a cause for her symptoms.  She notes the ENT physician advised her they did not have know why she was feeling lightheaded.  She notes no syncope.  She has been drinking plenty of fluids.  Hypersomnia: Patient does note hypersomnia.  She does not wake well rested.  She notes no apnea though does snore.  Depression: Patient notes her father was recently hospitalized with difficulty swallowing and they found a brain lesion.  He had a biopsy last week.  She has had difficulty dealing with this.  She is taking Zoloft.  No SI.  Social History   Tobacco Use  Smoking Status Never Smoker  Smokeless Tobacco Never Used    Current Outpatient Medications on File Prior to Visit  Medication Sig Dispense Refill  . albuterol (VENTOLIN HFA) 108 (90 Base) MCG/ACT inhaler Inhale 2 puffs into the lungs every 6 (six) hours as needed for wheezing or shortness of breath. 8 g 0  . etodolac (LODINE) 400 MG tablet Take 400 mg by mouth 2 (two) times daily.    . meloxicam (MOBIC) 15 MG tablet Take 1 tablet by mouth daily.    . Multiple Vitamins-Minerals (EQ MULTIVITAMINS ADULT GUMMY PO) Take one gummy by mouth daily.    . sertraline (ZOLOFT) 100 MG tablet Take 1 tablet (100 mg total) by mouth daily. 30 tablet 2  . tiZANidine (ZANAFLEX) 4 MG tablet Take 4 mg by mouth 2 (two) times daily as needed.    . meclizine (ANTIVERT) 25 MG tablet Take 1 tablet (25 mg total) by mouth 3 (three) times daily as needed for dizziness. (Patient not taking: Reported on 10/08/2020) 30 tablet 0   No  current facility-administered medications on file prior to visit.     ROS see history of present illness  Objective  Physical Exam Vitals:   10/08/20 1024  BP: 118/60  Pulse: 72  Temp: 97.6 F (36.4 C)  SpO2: 98%    BP Readings from Last 3 Encounters:  10/08/20 118/60  09/13/20 102/72  07/07/20 105/68   Wt Readings from Last 3 Encounters:  10/08/20 204 lb (92.5 kg)  07/07/20 203 lb (92.1 kg)  07/01/20 203 lb 12.8 oz (92.4 kg)    Physical Exam Constitutional:      General: She is not in acute distress.    Appearance: She is not diaphoretic.  Cardiovascular:     Rate and Rhythm: Normal rate and regular rhythm.     Heart sounds: Normal heart sounds.  Pulmonary:     Effort: Pulmonary effort is normal.     Breath sounds: Normal breath sounds.  Skin:    General: Skin is warm and dry.  Neurological:     Mental Status: She is alert.    Epworth sleepiness scale score of 13 with corresponding scores of 2, 2, 3, 3, 2, 0, 1, 0  Assessment/Plan: Please see individual problem list.  Problem List Items Addressed This Visit    Dizziness    Chronic ongoing issue.  No specific cause has been  found.  She is waiting on an appointment from neurology.  I encouraged her to contact them to get this scheduled.      Depression, major, single episode, moderate (HCC)    She is dealing with increased depression currently as her father is in the hospital with a brain lesion.  She does not want to make any changes at this time.  She will continue Zoloft 100 mg once daily.  She will let us know if anything changes.      Rash    Rash responded to nystatin powder previously.  She requests a refill to use as needed.      Relevant Medications   nystatin (MYCOSTATIN/NYSTOP) powder   Hypersomnia    This is concerning for sleep apnea.  We will proceed with a home sleep study.      Relevant Orders   Home sleep test      Return in about 3 months (around 01/08/2021) for  hypersomnia.  This visit occurred during the SARS-CoV-2 public health emergency.  Safety protocols were in place, including screening questions prior to the visit, additional usage of staff PPE, and extensive cleaning of exam room while observing appropriate contact time as indicated for disinfecting solutions.    [name], MD [facility]

## 2020-10-08 NOTE — Assessment & Plan Note (Signed)
This is concerning for sleep apnea.  We will proceed with a home sleep study.

## 2020-10-08 NOTE — Patient Instructions (Signed)
Nice to see you. Please call neurology to arrange an appointment. We will get a home sleep study scheduled for you.

## 2020-10-08 NOTE — Assessment & Plan Note (Signed)
She is dealing with increased depression currently as her father is in the hospital with a brain lesion.  She does not want to make any changes at this time.  She will continue Zoloft 100 mg once daily.  She will let us know if anything changes.

## 2020-10-08 NOTE — Assessment & Plan Note (Signed)
Rash responded to nystatin powder previously.  She requests a refill to use as needed.

## 2020-10-18 ENCOUNTER — Encounter: Payer: Self-pay | Admitting: Family Medicine

## 2020-11-11 ENCOUNTER — Telehealth: Payer: Self-pay | Admitting: Family Medicine

## 2020-11-11 NOTE — Telephone Encounter (Unsigned)
I spoke with sleep med Angela Mccormick stated that ins is in process of approval once she gets the response from ins pt will be called. I also called pt to inform. Pt understood.

## 2020-11-11 NOTE — Telephone Encounter (Signed)
spoke with sleep med Ladona Ridgel stated that ins is in process of approval once she gets the response from ins pt will be called. I also called pt to inform. Pt understood.

## 2020-11-22 DIAGNOSIS — Z1329 Encounter for screening for other suspected endocrine disorder: Secondary | ICD-10-CM | POA: Diagnosis not present

## 2020-11-22 DIAGNOSIS — Z124 Encounter for screening for malignant neoplasm of cervix: Secondary | ICD-10-CM | POA: Diagnosis not present

## 2020-11-22 DIAGNOSIS — Z01419 Encounter for gynecological examination (general) (routine) without abnormal findings: Secondary | ICD-10-CM | POA: Diagnosis not present

## 2020-11-22 DIAGNOSIS — E669 Obesity, unspecified: Secondary | ICD-10-CM | POA: Diagnosis not present

## 2020-11-22 DIAGNOSIS — F5222 Female sexual arousal disorder: Secondary | ICD-10-CM | POA: Diagnosis not present

## 2020-11-22 DIAGNOSIS — E349 Endocrine disorder, unspecified: Secondary | ICD-10-CM | POA: Diagnosis not present

## 2020-12-11 ENCOUNTER — Ambulatory Visit: Payer: BC Managed Care – PPO | Attending: Otolaryngology

## 2020-12-11 DIAGNOSIS — G4710 Hypersomnia, unspecified: Secondary | ICD-10-CM | POA: Diagnosis not present

## 2020-12-11 DIAGNOSIS — R0683 Snoring: Secondary | ICD-10-CM | POA: Diagnosis not present

## 2020-12-15 ENCOUNTER — Telehealth: Payer: Self-pay | Admitting: Family Medicine

## 2020-12-15 NOTE — Telephone Encounter (Signed)
I spoke with pt she received her cpap. I spoke back with Stacy at sleep med to disregard.

## 2020-12-15 NOTE — Telephone Encounter (Signed)
Lft msg at sleep med to follow up on pt sleep study. thanks

## 2021-01-07 DIAGNOSIS — F5222 Female sexual arousal disorder: Secondary | ICD-10-CM | POA: Diagnosis not present

## 2021-01-10 ENCOUNTER — Other Ambulatory Visit: Payer: Self-pay

## 2021-01-10 ENCOUNTER — Ambulatory Visit (INDEPENDENT_AMBULATORY_CARE_PROVIDER_SITE_OTHER): Payer: BC Managed Care – PPO

## 2021-01-10 ENCOUNTER — Ambulatory Visit (INDEPENDENT_AMBULATORY_CARE_PROVIDER_SITE_OTHER): Payer: BC Managed Care – PPO | Admitting: Family Medicine

## 2021-01-10 ENCOUNTER — Encounter: Payer: Self-pay | Admitting: Family Medicine

## 2021-01-10 VITALS — BP 120/70 | HR 72 | Temp 98.3°F | Ht 63.0 in | Wt 204.6 lb

## 2021-01-10 DIAGNOSIS — F419 Anxiety disorder, unspecified: Secondary | ICD-10-CM | POA: Diagnosis not present

## 2021-01-10 DIAGNOSIS — M25561 Pain in right knee: Secondary | ICD-10-CM

## 2021-01-10 DIAGNOSIS — F32A Depression, unspecified: Secondary | ICD-10-CM

## 2021-01-10 DIAGNOSIS — G4710 Hypersomnia, unspecified: Secondary | ICD-10-CM | POA: Diagnosis not present

## 2021-01-10 DIAGNOSIS — Z23 Encounter for immunization: Secondary | ICD-10-CM

## 2021-01-10 DIAGNOSIS — F321 Major depressive disorder, single episode, moderate: Secondary | ICD-10-CM

## 2021-01-10 DIAGNOSIS — R6882 Decreased libido: Secondary | ICD-10-CM | POA: Insufficient documentation

## 2021-01-10 MED ORDER — SERTRALINE HCL 100 MG PO TABS: 150.0000 mg | ORAL_TABLET | Freq: Every day | ORAL | 2 refills | Status: DC

## 2021-01-10 NOTE — Assessment & Plan Note (Signed)
Status post fall.  There is diffuse tenderness over her anterior knee.  There is an abrasive wound with good granulation tissue.  Discussed stopping hydrogen peroxide use as this would inhibit growth of good tissue that will help the area heal.  Discussed cleansing with soap and water.  Advised to monitor for signs of infection.  Given diffuse discomfort we will get an x-ray.

## 2021-01-10 NOTE — Patient Instructions (Signed)
Nice to see you. We will get an x-ray today. Please clean the wound with soap and water. Your sleep study did not reveal sleep apnea.  We will see how you do with the increased dose of Zoloft. If you develop spreading redness, increasing pain, or fevers please let us know right away.

## 2021-01-10 NOTE — Assessment & Plan Note (Signed)
Her sleep study was negative.  This could be related to her anxiety and depression.  We will treat that as outlined.

## 2021-01-10 NOTE — Progress Notes (Signed)
[name], MD Phone: [phone]  Angela Mccormick is a 35 y.o. female who presents today for f/u.  Right knee pain: Patient fell a week or so ago.  She scraped her right anterior knee.  She fell directly onto the knee and her knee has been hurting since then.  She has been able to ambulate.  She notes there is some drainage from the anterior wound.  She has been using hydrogen peroxide to clean it.  She used soap and water initially with alcohol and hydrogen peroxide.  Hypersomnia: She had a sleep study done.  This was negative for OSA.  Anxiety/depression: Patient notes she continues to deal with these things.  The severity varies.  No SI.  She is on Zoloft 100 mg once daily.  She notes this is mostly related to her father's medical issues.  Low sex drive: She is seeing GYN.  Testosterone injections.  Social History   Tobacco Use  Smoking Status Never  Smokeless Tobacco Never    Current Outpatient Medications on File Prior to Visit  Medication Sig Dispense Refill   albuterol (VENTOLIN HFA) 108 (90 Base) MCG/ACT inhaler Inhale 2 puffs into the lungs every 6 (six) hours as needed for wheezing or shortness of breath. 8 g 0   etodolac (LODINE) 400 MG tablet Take 400 mg by mouth 2 (two) times daily.     meclizine (ANTIVERT) 25 MG tablet Take 1 tablet (25 mg total) by mouth 3 (three) times daily as needed for dizziness. 30 tablet 0   meloxicam (MOBIC) 15 MG tablet Take 1 tablet by mouth daily.     Multiple Vitamins-Minerals (EQ MULTIVITAMINS ADULT GUMMY PO) Take one gummy by mouth daily.     nystatin (MYCOSTATIN/NYSTOP) powder Apply 1 application topically 3 (three) times daily. 15 g 0   testosterone cypionate (DEPOTESTOSTERONE CYPIONATE) 200 MG/ML injection Inject into the muscle.     tiZANidine (ZANAFLEX) 4 MG tablet Take 4 mg by mouth 2 (two) times daily as needed.     No current facility-administered medications on file prior to visit.     ROS see history of present  illness  Objective  Physical Exam Vitals:   01/10/21 1444  BP: 120/70  Pulse: 72  Temp: 98.3 F (36.8 C)  SpO2: 99%    BP Readings from Last 3 Encounters:  01/10/21 120/70  10/08/20 118/60  09/13/20 102/72   Wt Readings from Last 3 Encounters:  01/10/21 204 lb 9.6 oz (92.8 kg)  10/08/20 204 lb (92.5 kg)  07/07/20 203 lb (92.1 kg)    Physical Exam Constitutional:      General: She is not in acute distress.    Appearance: She is not diaphoretic.  Cardiovascular:     Rate and Rhythm: Normal rate and regular rhythm.     Heart sounds: Normal heart sounds.  Pulmonary:     Effort: Pulmonary effort is normal.     Breath sounds: Normal breath sounds.  Skin:    General: Skin is warm and dry.  Neurological:     Mental Status: She is alert.     Assessment/Plan: Please see individual problem list.  Problem List Items Addressed This Visit     Anxiety and depression    Stable though uncontrolled.  We will increase her Zoloft to 150 mg daily.  She will monitor symptoms.  If she has any worsening symptoms she will let us know.      Relevant Medications   sertraline (ZOLOFT) 100 MG tablet  Hypersomnia    Her sleep study was negative.  This could be related to her anxiety and depression.  We will treat that as outlined.      Low libido    I advised this could be related to her anxiety and depression.  We will see if this improves with increasing her Zoloft.  She will continue to follow with GYN.      Right knee pain - Primary    Status post fall.  There is diffuse tenderness over her anterior knee.  There is an abrasive wound with good granulation tissue.  Discussed stopping hydrogen peroxide use as this would inhibit growth of good tissue that will help the area heal.  Discussed cleansing with soap and water.  Advised to monitor for signs of infection.  Given diffuse discomfort we will get an x-ray.      Relevant Orders   DG Knee Complete 4 Views Right   Other Visit  Diagnoses     Need for immunization against influenza       Relevant Orders   Flu Vaccine QUAD 42mo+IM (Fluarix, Fluzone & Alfiuria Quad PF) (Completed)   Depression, major, single episode, moderate (HCC)       Relevant Medications   sertraline (ZOLOFT) 100 MG tablet       Return in about 6 weeks (around 02/21/2021) for anxiety/depression.  This visit occurred during the SARS-CoV-2 public health emergency.  Safety protocols were in place, including screening questions prior to the visit, additional usage of staff PPE, and extensive cleaning of exam room while observing appropriate contact time as indicated for disinfecting solutions.    [name], MD [facility]

## 2021-01-10 NOTE — Assessment & Plan Note (Signed)
Stable though uncontrolled.  We will increase her Zoloft to 150 mg daily.  She will monitor symptoms.  If she has any worsening symptoms she will let us know.

## 2021-01-10 NOTE — Assessment & Plan Note (Signed)
I advised this could be related to her anxiety and depression.  We will see if this improves with increasing her Zoloft.  She will continue to follow with GYN.

## 2021-01-11 ENCOUNTER — Encounter: Payer: Self-pay | Admitting: Family Medicine

## 2021-01-12 MED ORDER — BUSPIRONE HCL 5 MG PO TABS: 5.0000 mg | ORAL_TABLET | Freq: Two times a day (BID) | ORAL | 3 refills | Status: DC

## 2021-01-14 DIAGNOSIS — F5222 Female sexual arousal disorder: Secondary | ICD-10-CM | POA: Diagnosis not present

## 2021-01-20 ENCOUNTER — Other Ambulatory Visit: Payer: Self-pay

## 2021-02-04 DIAGNOSIS — F5222 Female sexual arousal disorder: Secondary | ICD-10-CM | POA: Diagnosis not present

## 2021-02-08 ENCOUNTER — Other Ambulatory Visit: Payer: Self-pay | Admitting: Family Medicine

## 2021-02-08 DIAGNOSIS — R21 Rash and other nonspecific skin eruption: Secondary | ICD-10-CM

## 2021-02-09 NOTE — Telephone Encounter (Signed)
Can you find out what she needs the nystatin for currently?

## 2021-02-10 NOTE — Telephone Encounter (Signed)
Called to speak with Angela Mccormick. She states that the powder is for her underarms. Pt states that it is for the same issue as last time and that she has had a flare up.

## 2021-02-11 MED ORDER — NYSTATIN 100000 UNIT/GM EX POWD: 1.0000 "application " | Freq: Three times a day (TID) | CUTANEOUS | 0 refills | Status: DC

## 2021-02-11 MED ORDER — ALBUTEROL SULFATE HFA 108 (90 BASE) MCG/ACT IN AERS: 2.0000 | INHALATION_SPRAY | Freq: Four times a day (QID) | RESPIRATORY_TRACT | 0 refills | Status: DC | PRN

## 2021-02-16 DIAGNOSIS — F5222 Female sexual arousal disorder: Secondary | ICD-10-CM | POA: Diagnosis not present

## 2021-02-23 DIAGNOSIS — G43801 Other migraine, not intractable, with status migrainosus: Secondary | ICD-10-CM | POA: Diagnosis not present

## 2021-02-23 DIAGNOSIS — Z6836 Body mass index (BMI) 36.0-36.9, adult: Secondary | ICD-10-CM | POA: Diagnosis not present

## 2021-02-23 DIAGNOSIS — R42 Dizziness and giddiness: Secondary | ICD-10-CM | POA: Diagnosis not present

## 2021-02-28 ENCOUNTER — Ambulatory Visit: Payer: BC Managed Care – PPO | Admitting: Family Medicine

## 2021-03-01 ENCOUNTER — Encounter: Payer: Self-pay | Admitting: Family Medicine

## 2021-03-09 ENCOUNTER — Encounter: Payer: Self-pay | Admitting: Family Medicine

## 2021-03-16 DIAGNOSIS — F5222 Female sexual arousal disorder: Secondary | ICD-10-CM | POA: Diagnosis not present

## 2021-04-05 ENCOUNTER — Encounter: Payer: Self-pay | Admitting: Family Medicine

## 2021-04-05 ENCOUNTER — Ambulatory Visit (INDEPENDENT_AMBULATORY_CARE_PROVIDER_SITE_OTHER): Payer: BC Managed Care – PPO | Admitting: Family Medicine

## 2021-04-05 ENCOUNTER — Other Ambulatory Visit: Payer: Self-pay

## 2021-04-05 VITALS — BP 116/78 | HR 62 | Temp 97.8°F | Ht 63.0 in | Wt 209.0 lb

## 2021-04-05 DIAGNOSIS — Z0000 Encounter for general adult medical examination without abnormal findings: Secondary | ICD-10-CM

## 2021-04-05 DIAGNOSIS — R350 Frequency of micturition: Secondary | ICD-10-CM | POA: Diagnosis not present

## 2021-04-05 DIAGNOSIS — E669 Obesity, unspecified: Secondary | ICD-10-CM

## 2021-04-05 DIAGNOSIS — Z13220 Encounter for screening for lipoid disorders: Secondary | ICD-10-CM

## 2021-04-05 LAB — POCT URINALYSIS DIPSTICK
Bilirubin, UA: NEGATIVE
Glucose, UA: NEGATIVE
Ketones, UA: NEGATIVE
Leukocytes, UA: NEGATIVE
Nitrite, UA: NEGATIVE
Protein, UA: NEGATIVE
Spec Grav, UA: 1.025 (ref 1.010–1.025)
Urobilinogen, UA: 0.2 E.U./dL
pH, UA: 5.5 (ref 5.0–8.0)

## 2021-04-05 LAB — COMPREHENSIVE METABOLIC PANEL
ALT: 23 U/L (ref 0–35)
AST: 16 U/L (ref 0–37)
Albumin: 4.2 g/dL (ref 3.5–5.2)
Alkaline Phosphatase: 63 U/L (ref 39–117)
BUN: 9 mg/dL (ref 6–23)
CO2: 27 mEq/L (ref 19–32)
Calcium: 9.1 mg/dL (ref 8.4–10.5)
Chloride: 103 mEq/L (ref 96–112)
Creatinine, Ser: 0.65 mg/dL (ref 0.40–1.20)
GFR: 115.35 mL/min (ref >60.00)
Glucose, Bld: 87 mg/dL (ref 70–99)
Potassium: 3.8 mEq/L (ref 3.5–5.1)
Sodium: 137 mEq/L (ref 135–145)
Total Bilirubin: 0.4 mg/dL (ref 0.2–1.2)
Total Protein: 6.8 g/dL (ref 6.0–8.3)

## 2021-04-05 LAB — LIPID PANEL
Cholesterol: 139 mg/dL (ref 0–200)
HDL: 45.6 mg/dL (ref >39.00)
LDL Cholesterol: 82 mg/dL (ref 0–99)
NonHDL: 93.57
Total CHOL/HDL Ratio: 3
Triglycerides: 57 mg/dL (ref 0.0–149.0)
VLDL: 11.4 mg/dL (ref 0.0–40.0)

## 2021-04-05 LAB — HEMOGLOBIN A1C: Hgb A1c MFr Bld: 5.5 % (ref 4.6–6.5)

## 2021-04-05 NOTE — Assessment & Plan Note (Signed)
Physical exam completed. Encouraged increasing her activity level. Advised to continue healthy diet. Discussed eliminating sodas. Advised to confirm what kind of cancer her mother has as if it is colon cancer it would help dictate when her screening would start. Encouraged to get the bivalent COVID vaccine. Lab work today.

## 2021-04-05 NOTE — Patient Instructions (Signed)
Nice to see you. We will check labs today.  Please increase your physical activity to 3-4 days per week.  Please continue with healthy diet.

## 2021-04-05 NOTE — Assessment & Plan Note (Signed)
This could be related to a UTI or a diabetic issue. Check urinalysis and glucose today.

## 2021-04-05 NOTE — Progress Notes (Signed)
Tommi Rumps, MD Phone: [phone]  Angela Mccormick is a 35 y.o. female who presents today for CPE.  Diet: drinking more water, more fruits and vegetables, no fried foods, cut down on sweets, 1 soda per day Exercise: walks 1 hour 1-2x/week[place] Pap smear: 11/22/20 with GYN Family history-  Colon cancer: unsure if her mom has this or some other cancer  Breast cancer: paternal great grandmother  Ovarian cancer: no Menses: 1x/month lasting 1-2 days Vaccines-   Flu: UTD  Tetanus: UTD  COVID19: x2 HIV screening: UTD Hep C Screening: UTD - reports having this in the past Tobacco use: no Alcohol use: no Illicit Drug use: no Dentist: yes Ophthalmology: yes  Notes increased urine frequency over the past couple of weeks. Notes urgency as well. No dysuria.    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   Migraines 09/26/2012   Nodule of neck 11/01/2016   Rectal bleeding 03/04/2018   Dizziness 09/08/2019   Asthma 12/08/2019   Routine general medical examination at a health care facility 03/05/2020   Anxiety and depression 03/05/2020   Obesity (BMI 30.0-34.9) 03/05/2020   Congestion of nasal sinus 04/19/2020   Rash 04/19/2020   Abnormal menses 07/01/2020   Hypersomnia 10/08/2020   Right knee pain 01/10/2021   Low libido 01/10/2021   Urine frequency 04/05/2021   Resolved Ambulatory Problems    Diagnosis Date Noted   Syncope 09/26/2012   Rash 11/01/2016   Elevated glucose 04/13/2017   Trying to get pregnant 04/13/2017   BMI 37.0-37.9, adult 04/13/2017   Supervision of high risk pregnancy, antepartum 06/19/2017   Obesity affecting pregnancy, antepartum 06/19/2017   Influenza 07/27/2017   Nausea and vomiting during pregnancy 08/13/2017   Preterm premature rupture of membranes 12/15/2017   Postpartum care following cesarean delivery 12/20/2017   Routine general medical examination at a health care facility  03/04/2018   Nonhealing surgical wound 03/04/2018   Otitis media 04/27/2018   Short interval between pregnancies affecting pregnancy, antepartum 09/11/2018   History of preterm delivery, currently pregnant 11/06/2018   History of cesarean section 11/28/2018   History of gestational diabetes 03/14/2019   Preterm premature rupture of membranes 04/06/2019   Encounter for care and examination of lactating mother 04/10/2019   Shortness of breath 09/13/2019   Past Medical History:  Diagnosis Date   Depression    Gestational diabetes    History of Chiari malformation    History of syncope    Migraine     Family History  Problem Relation Age of Onset   Colon cancer Mother    Diabetes Mother    Stroke Mother    Diabetes Father    Stroke Maternal Grandfather    Heart disease Maternal Grandfather        CABG   Stroke Maternal Aunt     Social History   Socioeconomic History   Marital status: Married    Spouse name: Not on file   Number of children: Not on file   Years of education: Not on file   Highest education level: Not on file  Occupational History   Not on file  Tobacco Use   Smoking status: Never   Smokeless tobacco: Never  Vaping Use   Vaping Use: Never used  Substance and Sexual Activity   Alcohol use: Not Currently    Comment: 1 drink/year   Drug use: No   Sexual activity: Yes  Partners: Male    Birth control/protection: None  Other Topics Concern   Not on file  Social History Narrative   Not on file   Social Determinants of Health   Financial Resource Strain: Not on file  Food Insecurity: Not on file  Transportation Needs: Not on file  Physical Activity: Not on file  Stress: Not on file  Social Connections: Not on file  Intimate Partner Violence: Not on file    ROS  General:  Negative for nexplained weight loss, fever Skin: Negative for new or changing mole, sore that won't heal HEENT: Negative for trouble hearing, trouble seeing, ringing in  ears, mouth sores, hoarseness, change in voice, dysphagia. CV:  Negative for chest pain, dyspnea, edema, palpitations Resp: Negative for cough, dyspnea, hemoptysis GI: Negative for nausea, vomiting, diarrhea, constipation, abdominal pain, melena, hematochezia. GU: Positive urine frequency and urgency, Negative for dysuria, incontinence, urinary hesitance, hematuria, vaginal or penile discharge, sexual difficulty, lumps in testicle or breasts MSK: Negative for muscle cramps or aches, joint pain or swelling Neuro: Negative for headaches, weakness, numbness, dizziness, passing out/fainting Psych: Negative for depression, anxiety, memory problems  Objective  Physical Exam Vitals:   04/05/21 1240  BP: 116/78  Pulse: 62  Temp: 97.8 F (36.6 C)  SpO2: 99%    BP Readings from Last 3 Encounters:  04/05/21 116/78  01/10/21 120/70  10/08/20 118/60   Wt Readings from Last 3 Encounters:  04/05/21 209 lb (94.8 kg)  01/10/21 204 lb 9.6 oz (92.8 kg)  10/08/20 204 lb (92.5 kg)    Physical Exam Constitutional:      General: She is not in acute distress.    Appearance: She is not diaphoretic.  HENT:     Head: Normocephalic and atraumatic.  Cardiovascular:     Rate and Rhythm: Normal rate and regular rhythm.     Heart sounds: Normal heart sounds.  Pulmonary:     Effort: Pulmonary effort is normal.     Breath sounds: Normal breath sounds.  Abdominal:     General: Bowel sounds are normal. There is no distension.     Palpations: Abdomen is soft.     Tenderness: There is no abdominal tenderness. There is no guarding or rebound.  Musculoskeletal:     Right lower leg: No edema.     Left lower leg: No edema.  Lymphadenopathy:     Cervical: No cervical adenopathy.  Skin:    General: Skin is warm and dry.  Neurological:     Mental Status: She is alert.  Psychiatric:        Mood and Affect: Mood normal.     Assessment/Plan:   Problem List Items Addressed This Visit     Obesity  (BMI 30.0-34.9)   Relevant Orders   HgB A1c   Routine general medical examination at a health care facility - Primary    Physical exam completed. Encouraged increasing her activity level. Advised to continue healthy diet. Discussed eliminating sodas. Advised to confirm what kind of cancer her mother has as if it is colon cancer it would help dictate when her screening would start. Encouraged to get the bivalent COVID vaccine. Lab work today.       Urine frequency    This could be related to a UTI or a diabetic issue. Check urinalysis and glucose today.       Relevant Orders   POCT Urinalysis Dipstick   Other Visit Diagnoses     Lipid screening  Relevant Orders   Comp Met (CMET)   Lipid panel       Return in about 6 months (around 10/03/2021).  This visit occurred during the SARS-CoV-2 public health emergency.  Safety protocols were in place, including screening questions prior to the visit, additional usage of staff PPE, and extensive cleaning of exam room while observing appropriate contact time as indicated for disinfecting solutions.    Tommi Rumps, MD [place]

## 2021-04-10 ENCOUNTER — Telehealth: Payer: BC Managed Care – PPO | Admitting: Emergency Medicine

## 2021-04-10 DIAGNOSIS — J208 Acute bronchitis due to other specified organisms: Secondary | ICD-10-CM | POA: Diagnosis not present

## 2021-04-10 MED ORDER — PREDNISONE 10 MG (21) PO TBPK: ORAL_TABLET | Freq: Every day | ORAL | 0 refills | Status: DC

## 2021-04-10 MED ORDER — BENZONATATE 100 MG PO CAPS: 100.0000 mg | ORAL_CAPSULE | Freq: Two times a day (BID) | ORAL | 0 refills | Status: DC | PRN

## 2021-04-10 NOTE — Patient Instructions (Signed)
Angela Mccormick, thank you for joining [name], PA-C for today's virtual visit.  While this provider is not your primary care provider (PCP), if your PCP is located in our provider database this encounter information will be shared with them immediately following your visit.  Consent: (Patient) Angela Mccormick provided verbal consent for this virtual visit at the beginning of the encounter.  Current Medications:  Current Outpatient Medications:    benzonatate (TESSALON) 100 MG capsule, Take 1 capsule (100 mg total) by mouth 2 (two) times daily as needed for cough., Disp: 20 capsule, Rfl: 0   predniSONE (STERAPRED UNI-PAK 21 TAB) 10 MG (21) TBPK tablet, Take by mouth daily. Take 6 tabs by mouth daily  for 2 days, then 5 tabs for 2 days, then 4 tabs for 2 days, then 3 tabs for 2 days, 2 tabs for 2 days, then 1 tab by mouth daily for 2 days, Disp: 42 tablet, Rfl: 0   albuterol (VENTOLIN HFA) 108 (90 Base) MCG/ACT inhaler, Inhale 2 puffs into the lungs every 6 (six) hours as needed for wheezing or shortness of breath., Disp: 8 g, Rfl: 0   meclizine (ANTIVERT) 25 MG tablet, Take 1 tablet (25 mg total) by mouth 3 (three) times daily as needed for dizziness., Disp: 30 tablet, Rfl: 0   meloxicam (MOBIC) 15 MG tablet, Take 1 tablet by mouth daily., Disp: , Rfl:    Multiple Vitamins-Minerals (EQ MULTIVITAMINS ADULT GUMMY PO), Take one gummy by mouth daily., Disp: , Rfl:    nystatin (MYCOSTATIN/NYSTOP) powder, Apply 1 application topically 3 (three) times daily., Disp: 15 g, Rfl: 0   sertraline (ZOLOFT) 100 MG tablet, Take 1.5 tablets (150 mg total) by mouth daily., Disp: 135 tablet, Rfl: 2   testosterone cypionate (DEPOTESTOSTERONE CYPIONATE) 200 MG/ML injection, Inject into the muscle., Disp: , Rfl:    tiZANidine (ZANAFLEX) 4 MG tablet, Take 4 mg by mouth 2 (two) times daily as needed., Disp: , Rfl:    Medications ordered in this encounter:  Meds ordered this encounter  Medications    predniSONE (STERAPRED UNI-PAK 21 TAB) 10 MG (21) TBPK tablet    Sig: Take by mouth daily. Take 6 tabs by mouth daily  for 2 days, then 5 tabs for 2 days, then 4 tabs for 2 days, then 3 tabs for 2 days, 2 tabs for 2 days, then 1 tab by mouth daily for 2 days    Dispense:  42 tablet    Refill:  0    Order Specific Question:   Supervising Provider    Answer:   Hyacinth Meeker, BRIAN [[id number]]   benzonatate (TESSALON) 100 MG capsule    Sig: Take 1 capsule (100 mg total) by mouth 2 (two) times daily as needed for cough.    Dispense:  20 capsule    Refill:  0    Order Specific Question:   Supervising Provider    Answer:   Hyacinth Meeker, BRIAN [[id number]]     *If you need refills on other medications prior to your next appointment, please contact your pharmacy*  Follow-Up: Call back or seek an in-person evaluation if the symptoms worsen or if the condition fails to improve as anticipated.  Other Instructions Get plenty of rest and push fluids Prednisone prescribed for wheezing Tesslon perles for cough Use zyrtec for nasal congestion, runny nose, and/or sore throat Use flonase for nasal congestion and runny nose Use medications daily for symptom relief Use OTC medications like ibuprofen or tylenol as needed  fever or pain Follow up with PCP as needed Seek in person evaluation with PCP, urgent care, or go to the ED if you have any new or worsening symptoms such as fever, worsening cough, shortness of breath, chest tightness, chest pain, turning blue, changes in mental status, etc...     If you have been instructed to have an in-person evaluation today at a local Urgent Care facility, please use the link below. It will take you to a list of all of our available [facility], including address, phone number and hours of operation. Please do not delay care.  [facility]  If you or a family member do not have a primary care provider, use the link below to schedule a visit and establish care.  When you choose a [facility] primary care physician or advanced practice provider, you gain a long-term partner in health. Find a Primary Care Provider  Learn more about [facility]'s in-office and virtual care options: [facility] - Get Care Now

## 2021-04-10 NOTE — Progress Notes (Signed)
Virtual Visit Consent   Angela Mccormick, you are scheduled for a virtual visit with a [facility] provider today.     Just as with appointments in the office, your consent must be obtained to participate.  Your consent will be active for this visit and any virtual visit you may have with one of our providers in the next 365 days.     If you have a MyChart account, a copy of this consent can be sent to you electronically.  All virtual visits are billed to your insurance company just like a traditional visit in the office.    As this is a virtual visit, video technology does not allow for your provider to perform a traditional examination.  This may limit your provider's ability to fully assess your condition.  If your provider identifies any concerns that need to be evaluated in person or the need to arrange testing (such as labs, EKG, etc.), we will make arrangements to do so.     Although advances in technology are sophisticated, we cannot ensure that it will always work on either your end or our end.  If the connection with a video visit is poor, the visit may have to be switched to a telephone visit.  With either a video or telephone visit, we are not always able to ensure that we have a secure connection.     I need to obtain your verbal consent now.   Are you willing to proceed with your visit today? yes   Angela Mccormick has provided verbal consent on 04/10/2021 for a virtual visit (video or telephone).   Angela Mccormick, [place]   Date: 04/10/2021 9:00 AM   Virtual Visit via Video Note   I, Angela Mccormick, connected with  WAUNITA SANDSTROM  ([id number], Jun 10, 1985) on 04/10/21 at  9:00 AM EST by a video-enabled telemedicine application and verified that I am speaking with the correct person using two identifiers.  Location: Patient: Virtual Visit Location Patient: Home Provider: Virtual Visit Location Provider: Home Office   I discussed the limitations of evaluation and management  by telemedicine and the availability of in person appointments. The patient expressed understanding and agreed to proceed.    History of Present Illness:Angela Mccormick is a 35 y.o. who identifies as a female who was assigned female at birth, and is being seen today for PND, wheezing, and dry cough x 3 days.  Denies sick exposure to COVID, flu or strep.  Has tried OTC medications without relief.  Symptoms are made worse with activity.  Reports previous symptoms in the past.   Denies fever, chills, sinus pain, rhinorrhea, SOB, chest pain, nausea, vomiting, changes in bowel or bladder habits.    ROS: As per HPI.  All other pertinent ROS negative.     HPI: HPI  Problems:  Patient Active Problem List   Diagnosis Date Noted   Urine frequency 04/05/2021   Right knee pain 01/10/2021   Low libido 01/10/2021   Hypersomnia 10/08/2020   Abnormal menses 07/01/2020   Congestion of nasal sinus 04/19/2020   Rash 04/19/2020   Routine general medical examination at a health care facility 03/05/2020   Anxiety and depression 03/05/2020   Obesity (BMI 30.0-34.9) 03/05/2020   Asthma 12/08/2019   Dizziness 09/08/2019   Rectal bleeding 03/04/2018   Nodule of neck 11/01/2016   Chiari malformation ([facility]) 09/26/2012   Migraines 09/26/2012    Allergies:  Allergies  Allergen Reactions   Sumatriptan Succinate Anxiety  Other     dissolvable staples - body rejects   Medications:  Current Outpatient Medications:    benzonatate (TESSALON) 100 MG capsule, Take 1 capsule (100 mg total) by mouth 2 (two) times daily as needed for cough., Disp: 20 capsule, Rfl: 0   predniSONE (STERAPRED UNI-PAK 21 TAB) 10 MG (21) TBPK tablet, Take by mouth daily. Take 6 tabs by mouth daily  for 2 days, then 5 tabs for 2 days, then 4 tabs for 2 days, then 3 tabs for 2 days, 2 tabs for 2 days, then 1 tab by mouth daily for 2 days, Disp: 42 tablet, Rfl: 0   albuterol (VENTOLIN HFA) 108 (90 Base) MCG/ACT inhaler, Inhale 2 puffs into  the lungs every 6 (six) hours as needed for wheezing or shortness of breath., Disp: 8 g, Rfl: 0   meclizine (ANTIVERT) 25 MG tablet, Take 1 tablet (25 mg total) by mouth 3 (three) times daily as needed for dizziness., Disp: 30 tablet, Rfl: 0   meloxicam (MOBIC) 15 MG tablet, Take 1 tablet by mouth daily., Disp: , Rfl:    Multiple Vitamins-Minerals (EQ MULTIVITAMINS ADULT GUMMY PO), Take one gummy by mouth daily., Disp: , Rfl:    nystatin (MYCOSTATIN/NYSTOP) powder, Apply 1 application topically 3 (three) times daily., Disp: 15 g, Rfl: 0   sertraline (ZOLOFT) 100 MG tablet, Take 1.5 tablets (150 mg total) by mouth daily., Disp: 135 tablet, Rfl: 2   testosterone cypionate (DEPOTESTOSTERONE CYPIONATE) 200 MG/ML injection, Inject into the muscle., Disp: , Rfl:    tiZANidine (ZANAFLEX) 4 MG tablet, Take 4 mg by mouth 2 (two) times daily as needed., Disp: , Rfl:   Observations/Objective: Patient is well-developed, well-nourished in no acute distress.  Resting comfortably at home. Fatigued appearing, but nontoxic Head is normocephalic, atraumatic.  No labored breathing. Speaking in full sentences, tolerating own secretions, cough present Speech is clear and coherent with logical content.  Patient is alert and oriented at baseline.    Assessment and Plan: 1. Viral bronchitis Get plenty of rest and push fluids Prednisone prescribed for wheezing Tesslon perles for cough Use zyrtec for nasal congestion, runny nose, and/or sore throat Use flonase for nasal congestion and runny nose Use medications daily for symptom relief Use OTC medications like ibuprofen or tylenol as needed fever or pain Follow up with PCP as needed Seek in person evaluation with PCP, urgent care, or go to the ED if you have any new or worsening symptoms such as fever, worsening cough, shortness of breath, chest tightness, chest pain, turning blue, changes in mental status, etc...    Follow Up Instructions: I discussed the  assessment and treatment plan with the patient. The patient was provided an opportunity to ask questions and all were answered. The patient agreed with the plan and demonstrated an understanding of the instructions.  A copy of instructions were sent to the patient via MyChart unless otherwise noted below.   The patient was advised to call back or seek an in-person evaluation if the symptoms worsen or if the condition fails to improve as anticipated.  Time:  I spent 10 minutes with the patient via telehealth technology discussing the above problems/concerns.    Angela Harding, PA-C

## 2021-04-12 ENCOUNTER — Telehealth: Payer: Self-pay

## 2021-04-12 NOTE — Telephone Encounter (Signed)
A Qualification Form was completed by the Provider and I called and LVM informing the patient that it was ready to be picked up, but that I also faxed the form to the fax number on the form [phone] and I informed her to pick up the original at the front desk, a mychart message was sent to the patient as well. Fax confirmation given.   Corin Tilly,cma

## 2021-04-15 DIAGNOSIS — F5222 Female sexual arousal disorder: Secondary | ICD-10-CM | POA: Diagnosis not present

## 2021-04-20 ENCOUNTER — Ambulatory Visit: Payer: BC Managed Care – PPO | Admitting: Family Medicine

## 2021-05-13 DIAGNOSIS — F5222 Female sexual arousal disorder: Secondary | ICD-10-CM | POA: Diagnosis not present

## 2021-06-10 DIAGNOSIS — F5222 Female sexual arousal disorder: Secondary | ICD-10-CM | POA: Diagnosis not present

## 2021-07-04 ENCOUNTER — Ambulatory Visit
Admission: EM | Admit: 2021-07-04 | Discharge: 2021-07-04 | Disposition: A | Discharge status: Home / Self Care | Payer: BC Managed Care – PPO

## 2021-07-04 ENCOUNTER — Other Ambulatory Visit: Payer: Self-pay

## 2021-07-04 ENCOUNTER — Encounter: Payer: Self-pay | Admitting: Emergency Medicine

## 2021-07-04 DIAGNOSIS — H9201 Otalgia, right ear: Secondary | ICD-10-CM

## 2021-07-04 NOTE — ED Triage Notes (Signed)
Pt c/o of right ear pain and drainage x 1 week.

## 2021-07-04 NOTE — Discharge Instructions (Addendum)
Follow up with your primary care provider if your symptoms are not improving.     

## 2021-07-04 NOTE — ED Provider Notes (Signed)
[facility]    CSN: [id number] Arrival date & time: 07/04/21  1154      History   Chief Complaint Chief Complaint  Patient presents with   Otalgia    HPI Angela Mccormick is a 36 y.o. female.  Patient presents with right ear pain and drainage x 1 week. No fever, sore throat, cough, shortness of breath, or other symptoms.  No treatment at home.  Her medical history includes asthma, migraine headaches, anxiety, depression.   The history is provided by the patient and medical records.   Past Medical History:  Diagnosis Date   Asthma    Depression    Gestational diabetes    History of Chiari malformation    History of syncope    Migraine     Patient Active Problem List   Diagnosis Date Noted   Urine frequency 04/05/2021   Right knee pain 01/10/2021   Low libido 01/10/2021   Hypersomnia 10/08/2020   Abnormal menses 07/01/2020   Congestion of nasal sinus 04/19/2020   Rash 04/19/2020   Routine general medical examination at a health care facility 03/05/2020   Anxiety and depression 03/05/2020   Obesity (BMI 30.0-34.9) 03/05/2020   Asthma 12/08/2019   Dizziness 09/08/2019   Rectal bleeding 03/04/2018   Nodule of neck 11/01/2016   Chiari malformation ([place]) 09/26/2012   Migraines 09/26/2012    Past Surgical History:  Procedure Laterality Date   CESAREAN SECTION N/A 12/17/2017   Procedure: CESAREAN SECTION;  Surgeon: [name], MD;  Location: [facility];  Service: Obstetrics;  Laterality: N/A;   CESAREAN SECTION WITH BILATERAL TUBAL LIGATION  04/06/2019   Procedure: CESAREAN SECTION WITH BILATERAL TUBAL LIGATION;  Surgeon: [name], MD;  Location: [facility];  Service: Obstetrics;;   FLEXIBLE SIGMOIDOSCOPY N/A 03/12/2018   Procedure: FLEXIBLE SIGMOIDOSCOPY;  Surgeon: Lin Landsman, MD;  Location: [facility];  Service: Gastroenterology;  Laterality: N/A;   NO PAST SURGERIES     WISDOM TOOTH EXTRACTION      OB History     Gravida  2    Para  2   Term  0   Preterm  2   AB      Living  2      SAB      IAB      Ectopic      Multiple  0   Live Births  2            Home Medications    Prior to Admission medications   Medication Sig Start Date End Date Taking? Authorizing Provider  albuterol (VENTOLIN HFA) 108 (90 Base) MCG/ACT inhaler Inhale 2 puffs into the lungs every 6 (six) hours as needed for wheezing or shortness of breath. 02/11/21   Leone Haven, MD  benzonatate (TESSALON) 100 MG capsule Take 1 capsule (100 mg total) by mouth 2 (two) times daily as needed for cough. 04/10/21   Wurst, [place], PA-C  meclizine (ANTIVERT) 25 MG tablet Take 1 tablet (25 mg total) by mouth 3 (three) times daily as needed for dizziness. 06/28/20   Vanessa Edgewater, MD  meloxicam (MOBIC) 15 MG tablet Take 1 tablet by mouth daily. 08/06/20   [provider]  Multiple Vitamins-Minerals (EQ MULTIVITAMINS ADULT GUMMY PO) Take one gummy by mouth daily.    [provider]  nystatin (MYCOSTATIN/NYSTOP) powder Apply 1 application topically 3 (three) times daily. 02/11/21   Leone Haven, MD  predniSONE (STERAPRED UNI-PAK 21 TAB) 10  MG (21) TBPK tablet Take by mouth daily. Take 6 tabs by mouth daily  for 2 days, then 5 tabs for 2 days, then 4 tabs for 2 days, then 3 tabs for 2 days, 2 tabs for 2 days, then 1 tab by mouth daily for 2 days 04/10/21   Stacey Drain, [place], PA-C  sertraline (ZOLOFT) 100 MG tablet Take 1.5 tablets (150 mg total) by mouth daily. 01/10/21   Leone Haven, MD  testosterone cypionate (DEPOTESTOSTERONE CYPIONATE) 200 MG/ML injection Inject into the muscle. 12/02/20   [provider]  tiZANidine (ZANAFLEX) 4 MG tablet Take 4 mg by mouth 2 (two) times daily as needed. 08/17/20   [provider]    Family History Family History  Problem Relation Age of Onset   Colon cancer Mother    Diabetes Mother    Stroke Mother    Diabetes Father    Stroke Maternal Grandfather     Heart disease Maternal Grandfather        CABG   Stroke Maternal Aunt     Social History Social History   Tobacco Use   Smoking status: Never   Smokeless tobacco: Never  Vaping Use   Vaping Use: Never used  Substance Use Topics   Alcohol use: Not Currently    Comment: 1 drink/year   Drug use: No     Allergies   Sumatriptan succinate and Other   Review of Systems Review of Systems  Constitutional:  Negative for chills and fever.  HENT:  Positive for ear discharge and ear pain. Negative for sore throat.   Respiratory:  Negative for cough and shortness of breath.   Cardiovascular:  Negative for chest pain and palpitations.  Gastrointestinal:  Negative for diarrhea and vomiting.  Skin:  Negative for color change and rash.  All other systems reviewed and are negative.   Physical Exam Triage Vital Signs ED Triage Vitals [07/04/21 1334]  Enc Vitals Group     BP      Pulse      Resp      Temp      Temp src      SpO2      Weight      Height      Head Circumference      Peak Flow      Pain Score 0     Pain Loc      Pain Edu?      Excl. in [place]?    No data found.  Updated Vital Signs BP 128/81 (BP Location: Right Arm)    Pulse 68    Temp 97.8 F (36.6 C) (Oral)    Resp 16    LMP 06/20/2021 (Approximate)    SpO2 100%   Visual Acuity Right Eye Distance:   Left Eye Distance:   Bilateral Distance:    Right Eye Near:   Left Eye Near:    Bilateral Near:     Physical Exam Vitals and nursing note reviewed.  Constitutional:      General: She is not in acute distress.    Appearance: Normal appearance. She is well-developed. She is not ill-appearing.  HENT:     Right Ear: Tympanic membrane and ear canal normal.     Left Ear: Tympanic membrane and ear canal normal.     Ears:     Comments: Scant cerumen in right ear canal but not occluded.    Nose: Nose normal.     Mouth/Throat:  Mouth: Mucous membranes are moist.     Pharynx: Oropharynx is clear.   Cardiovascular:     Rate and Rhythm: Normal rate and regular rhythm.     Heart sounds: Normal heart sounds.  Pulmonary:     Effort: Pulmonary effort is normal. No respiratory distress.     Breath sounds: Normal breath sounds.  Musculoskeletal:     Cervical back: Neck supple.  Skin:    General: Skin is warm and dry.  Neurological:     Mental Status: She is alert.  Psychiatric:        Mood and Affect: Mood normal.        Behavior: Behavior normal.     UC Treatments / Results  Labs (all labs ordered are listed, but only abnormal results are displayed) Labs Reviewed - No data to display  EKG   Radiology No results found.  Procedures Procedures (including critical care time)  Medications Ordered in UC Medications - No data to display  Initial Impression / Assessment and Plan / UC Course  I have reviewed the triage vital signs and the nursing notes.  Pertinent labs & imaging results that were available during my care of the patient were reviewed by me and considered in my medical decision making (see chart for details).   Right otalgia.  No indication of infection.  Discussed symptomatic care.  Instructed patient to follow up with her PCP if her symptoms are not improving.  She agrees to plan of care.     Final Clinical Impressions(s) / UC Diagnoses   Final diagnoses:  Right ear pain     Discharge Instructions      Follow up with your primary care provider if your symptoms are not improving.        ED Prescriptions   None    PDMP not reviewed this encounter.   Sharion Balloon, NP 07/04/21 1414

## 2021-07-11 DIAGNOSIS — F5222 Female sexual arousal disorder: Secondary | ICD-10-CM | POA: Diagnosis not present

## 2021-07-12 ENCOUNTER — Ambulatory Visit (INDEPENDENT_AMBULATORY_CARE_PROVIDER_SITE_OTHER): Payer: BC Managed Care – PPO | Admitting: Internal Medicine

## 2021-07-12 ENCOUNTER — Other Ambulatory Visit: Payer: Self-pay

## 2021-07-12 ENCOUNTER — Encounter: Payer: Self-pay | Admitting: Internal Medicine

## 2021-07-12 VITALS — BP 110/70 | HR 73 | Temp 98.2°F | Ht 63.0 in | Wt 212.6 lb

## 2021-07-12 DIAGNOSIS — H6690 Otitis media, unspecified, unspecified ear: Secondary | ICD-10-CM

## 2021-07-12 MED ORDER — CIPROFLOXACIN-DEXAMETHASONE 0.3-0.1 % OT SUSP: 4.0000 [drp] | Freq: Two times a day (BID) | OTIC | 0 refills | Status: DC

## 2021-07-12 MED ORDER — AMOXICILLIN-POT CLAVULANATE 875-125 MG PO TABS: 1.0000 | ORAL_TABLET | Freq: Two times a day (BID) | ORAL | 0 refills | Status: DC

## 2021-07-12 NOTE — Progress Notes (Signed)
Chief Complaint  Patient presents with   Ear Problem   F/u  1. Ear itching and pain b/l ears right ear draining yellow and left dry but itching 5/10 pain saw UC 07/04/21 onset x 1 week  Husband and kids sick recently  No water in ears   Review of Systems  HENT:  Positive for ear discharge and ear pain.   Respiratory:  Negative for shortness of breath.   Cardiovascular:  Negative for chest pain.  Past Medical History:  Diagnosis Date   Asthma    Depression    Gestational diabetes    History of Chiari malformation    History of syncope    Migraine    Past Surgical History:  Procedure Laterality Date   CESAREAN SECTION N/A 12/17/2017   Procedure: CESAREAN SECTION;  Surgeon: [name], MD;  Location: [facility];  Service: Obstetrics;  Laterality: N/A;   CESAREAN SECTION WITH BILATERAL TUBAL LIGATION  04/06/2019   Procedure: CESAREAN SECTION WITH BILATERAL TUBAL LIGATION;  Surgeon: [name], MD;  Location: [facility];  Service: Obstetrics;;   FLEXIBLE SIGMOIDOSCOPY N/A 03/12/2018   Procedure: FLEXIBLE SIGMOIDOSCOPY;  Surgeon: Lin Landsman, MD;  Location: [facility];  Service: Gastroenterology;  Laterality: N/A;   NO PAST SURGERIES     WISDOM TOOTH EXTRACTION     Family History  Problem Relation Age of Onset   Colon cancer Mother    Diabetes Mother    Stroke Mother    Diabetes Father    Stroke Maternal Grandfather    Heart disease Maternal Grandfather        CABG   Stroke Maternal Aunt    Social History   Socioeconomic History   Marital status: Married    Spouse name: Not on file   Number of children: Not on file   Years of education: Not on file   Highest education level: Not on file  Occupational History   Not on file  Tobacco Use   Smoking status: Never   Smokeless tobacco: Never  Vaping Use   Vaping Use: Never used  Substance and Sexual Activity   Alcohol use: Not Currently    Comment: 1 drink/year   Drug use: No   Sexual activity: Yes     Partners: Male    Birth control/protection: None  Other Topics Concern   Not on file  Social History Narrative   Not on file   Social Determinants of Health   Financial Resource Strain: Not on file  Food Insecurity: Not on file  Transportation Needs: Not on file  Physical Activity: Not on file  Stress: Not on file  Social Connections: Not on file  Intimate Partner Violence: Not on file   Current Meds  Medication Sig   albuterol (VENTOLIN HFA) 108 (90 Base) MCG/ACT inhaler Inhale 2 puffs into the lungs every 6 (six) hours as needed for wheezing or shortness of breath.   Multiple Vitamins-Minerals (EQ MULTIVITAMINS ADULT GUMMY PO) Take one gummy by mouth daily.   nystatin (MYCOSTATIN/NYSTOP) powder Apply 1 application topically 3 (three) times daily.   sertraline (ZOLOFT) 100 MG tablet Take 1.5 tablets (150 mg total) by mouth daily.   testosterone cypionate (DEPOTESTOSTERONE CYPIONATE) 200 MG/ML injection Inject into the muscle.   Allergies  Allergen Reactions   Sumatriptan Succinate Anxiety   Other     dissolvable staples - body rejects   No results found for this or any previous visit (from the past 2160 hour(s)). Objective  Body mass  index is 37.66 kg/m. Wt Readings from Last 3 Encounters:  07/12/21 212 lb 9.6 oz (96.4 kg)  04/05/21 209 lb (94.8 kg)  01/10/21 204 lb 9.6 oz (92.8 kg)   Temp Readings from Last 3 Encounters:  07/12/21 98.2 F (36.8 C) (Oral)  07/04/21 97.8 F (36.6 C) (Oral)  04/05/21 97.8 F (36.6 C) (Temporal)   BP Readings from Last 3 Encounters:  07/12/21 110/70  07/04/21 128/81  04/05/21 116/78   Pulse Readings from Last 3 Encounters:  07/12/21 73  07/04/21 68  04/05/21 62    Physical Exam Vitals and nursing note reviewed.  Constitutional:      Appearance: Normal appearance. She is well-developed and well-groomed.  HENT:     Head: Normocephalic and atraumatic.     Right Ear: Drainage present.     Ears:     Comments: Irritation  b/l ears right wet and left dry appearance Eyes:     Conjunctiva/sclera: Conjunctivae normal.     Pupils: Pupils are equal, round, and reactive to light.  Cardiovascular:     Rate and Rhythm: Normal rate and regular rhythm.     Heart sounds: Normal heart sounds. No murmur heard. Pulmonary:     Effort: Pulmonary effort is normal.     Breath sounds: Normal breath sounds.  Abdominal:     Tenderness: There is no abdominal tenderness.  Musculoskeletal:        General: No tenderness.  Skin:    General: Skin is warm and dry.  Neurological:     General: No focal deficit present.     Mental Status: She is alert and oriented to person, place, and time. Mental status is at baseline.     Cranial Nerves: Cranial nerves 2-12 are intact.     Motor: Motor function is intact.     Coordination: Coordination is intact.     Gait: Gait is intact.  Psychiatric:        Attention and Perception: Attention and perception normal.        Mood and Affect: Mood and affect normal.        Speech: Speech normal.        Behavior: Behavior normal. Behavior is cooperative.        Thought Content: Thought content normal.        Cognition and Memory: Cognition and memory normal.        Judgment: Judgment normal.    Assessment  Plan  No diagnosis found.  Provider: Dr. Olivia Mackie McLean-Scoc[facility]

## 2021-08-03 DIAGNOSIS — R300 Dysuria: Secondary | ICD-10-CM | POA: Diagnosis not present

## 2021-08-03 DIAGNOSIS — N390 Urinary tract infection, site not specified: Secondary | ICD-10-CM | POA: Diagnosis not present

## 2021-08-03 DIAGNOSIS — A499 Bacterial infection, unspecified: Secondary | ICD-10-CM | POA: Diagnosis not present

## 2021-08-08 DIAGNOSIS — F5222 Female sexual arousal disorder: Secondary | ICD-10-CM | POA: Diagnosis not present

## 2021-08-15 ENCOUNTER — Telehealth: Payer: BC Managed Care – PPO | Admitting: Family Medicine

## 2021-08-15 DIAGNOSIS — J019 Acute sinusitis, unspecified: Secondary | ICD-10-CM

## 2021-08-15 DIAGNOSIS — B9689 Other specified bacterial agents as the cause of diseases classified elsewhere: Secondary | ICD-10-CM | POA: Diagnosis not present

## 2021-08-15 MED ORDER — PROMETHAZINE-DM 6.25-15 MG/5ML PO SYRP: 2.5000 mL | ORAL_SOLUTION | Freq: Three times a day (TID) | ORAL | 0 refills | Status: DC | PRN

## 2021-08-15 MED ORDER — BENZONATATE 100 MG PO CAPS: 100.0000 mg | ORAL_CAPSULE | Freq: Two times a day (BID) | ORAL | 0 refills | Status: DC | PRN

## 2021-08-15 MED ORDER — DOXYCYCLINE HYCLATE 100 MG PO TABS: 100.0000 mg | ORAL_TABLET | Freq: Two times a day (BID) | ORAL | 0 refills | Status: AC

## 2021-08-15 NOTE — Progress Notes (Signed)
?Virtual Visit Consent  ? ?Cannon Kettle, you are scheduled for a virtual visit with a [facility] provider today.   ?  ?Just as with appointments in the office, your consent must be obtained to participate.  Your consent will be active for this visit and any virtual visit you may have with one of our providers in the next 365 days.   ?  ?If you have a MyChart account, a copy of this consent can be sent to you electronically.  All virtual visits are billed to your insurance company just like a traditional visit in the office.   ? ?As this is a virtual visit, video technology does not allow for your provider to perform a traditional examination.  This may limit your provider's ability to fully assess your condition.  If your provider identifies any concerns that need to be evaluated in person or the need to arrange testing (such as labs, EKG, etc.), we will make arrangements to do so.   ?  ?Although advances in technology are sophisticated, we cannot ensure that it will always work on either your end or our end.  If the connection with a video visit is poor, the visit may have to be switched to a telephone visit.  With either a video or telephone visit, we are not always able to ensure that we have a secure connection.    ? ?I need to obtain your verbal consent now.   Are you willing to proceed with your visit today?  ?  ?Angela Mccormick has provided verbal consent on 08/15/2021 for a virtual visit (video or telephone). ?  ?Freddy Finner, NP  ? ?Date: 08/15/2021 9:38 AM ? ? ?Virtual Visit via Video Note  ? ?IFreddy Finner, connected with  Angela Mccormick  ([id number], [birth year]) on 08/15/21 at 10:00 AM EDT by a video-enabled telemedicine application and verified that I am speaking with the correct person using two identifiers. ? ?Location: ?Patient: Virtual Visit Location Patient: Home ?Provider: Virtual Visit Location Provider: Home Office ?  ?I discussed the limitations of evaluation and management by  telemedicine and the availability of in person appointments. The patient expressed understanding and agreed to proceed.   ? ?History of Present Illness: ?Angela Mccormick is a 36 y.o. who identifies as a female who was assigned female at birth, and is being seen today for sinus symptoms. onset was 7 days ago- with post nasal drip.  ?Associated symptoms sore throat, cough, congestion and stuffy nose, voice change, headaches, chills, facial pressure and pain. Denies ear pain, chest pain, shortness of breath, fever. ? ?Problems:  ?Patient Active Problem List  ? Diagnosis Date Noted  ? Urine frequency 04/05/2021  ? Right knee pain 01/10/2021  ? Low libido 01/10/2021  ? Hypersomnia 10/08/2020  ? Abnormal menses 07/01/2020  ? Congestion of nasal sinus 04/19/2020  ? Rash 04/19/2020  ? Routine general medical examination at a health care facility 03/05/2020  ? Anxiety and depression 03/05/2020  ? Obesity (BMI 30.0-34.9) 03/05/2020  ? Asthma 12/08/2019  ? Dizziness 09/08/2019  ? Rectal bleeding 03/04/2018  ? Nodule of neck 11/01/2016  ? Chiari malformation ([facility]) 09/26/2012  ? Migraines 09/26/2012  ?  ?Allergies:  ?Allergies  ?Allergen Reactions  ? Sumatriptan Succinate Anxiety  ? Other   ?  dissolvable staples - body rejects  ? ?Medications:  ?Current Outpatient Medications:  ?  albuterol (VENTOLIN HFA) 108 (90 Base) MCG/ACT inhaler, Inhale 2 puffs into the lungs  every 6 (six) hours as needed for wheezing or shortness of breath., Disp: 8 g, Rfl: 0 ?  amoxicillin-clavulanate (AUGMENTIN) 875-125 MG tablet, Take 1 tablet by mouth 2 (two) times daily. With food, Disp: 14 tablet, Rfl: 0 ?  ciprofloxacin-dexamethasone (CIPRODEX) OTIC suspension, Place 4 drops into both ears 2 (two) times daily. X 4-7 days, Disp: 7.5 mL, Rfl: 0 ?  meclizine (ANTIVERT) 25 MG tablet, Take 1 tablet (25 mg total) by mouth 3 (three) times daily as needed for dizziness. (Patient not taking: Reported on 07/12/2021), Disp: 30 tablet, Rfl: 0 ?  Multiple  Vitamins-Minerals (EQ MULTIVITAMINS ADULT GUMMY PO), Take one gummy by mouth daily., Disp: , Rfl:  ?  nystatin (MYCOSTATIN/NYSTOP) powder, Apply 1 application topically 3 (three) times daily., Disp: 15 g, Rfl: 0 ?  sertraline (ZOLOFT) 100 MG tablet, Take 1.5 tablets (150 mg total) by mouth daily., Disp: 135 tablet, Rfl: 2 ?  testosterone cypionate (DEPOTESTOSTERONE CYPIONATE) 200 MG/ML injection, Inject into the muscle., Disp: , Rfl:  ? ?Observations/Objective: ?Patient is well-developed, well-nourished in no acute distress.  ?Resting comfortably  at home.  ?Head is normocephalic, atraumatic.  ?No labored breathing.  ?Speech is clear and coherent with logical content.  ?Patient is alert and oriented at baseline.  ?Congestion- nasal tone noted ? ?Assessment and Plan: ?1. Acute bacterial sinusitis ? ?- doxycycline (VIBRA-TABS) 100 MG tablet; Take 1 tablet (100 mg total) by mouth 2 (two) times daily for 10 days.  Dispense: 20 tablet; Refill: 0 ?- benzonatate (TESSALON) 100 MG capsule; Take 1 capsule (100 mg total) by mouth 2 (two) times daily as needed for cough.  Dispense: 20 capsule; Refill: 0 ?- promethazine-dextromethorphan (PROMETHAZINE-DM) 6.25-15 MG/5ML syrup; Take 2.5 mLs by mouth 3 (three) times daily as needed for cough.  Dispense: 118 mL; Refill: 0 ? ?S&S are consistent with bacterial sinus infection ?Treatment as above ?OTC and meds discussed in detailed ?No work or school not needed ?Info on AVS  ? ? Reviewed side effects, risks and benefits of medication.   ? ?Patient acknowledged agreement and understanding of the plan.  ? ? ? ?Follow Up Instructions: ?I discussed the assessment and treatment plan with the patient. The patient was provided an opportunity to ask questions and all were answered. The patient agreed with the plan and demonstrated an understanding of the instructions.  A copy of instructions were sent to the patient via MyChart unless otherwise noted below.  ? ?The patient was advised to call  back or seek an in-person evaluation if the symptoms worsen or if the condition fails to improve as anticipated. ? ?Time:  ?I spent 10 minutes with the patient via telehealth technology discussing the above problems/concerns.   ? ?Freddy Finner, NP ? ?

## 2021-08-15 NOTE — Patient Instructions (Addendum)
Doxycycline Biphasic Release Capsules (Rosacea) ?What is this medication? ?DOXYCYCLINE (dox i SYE kleen) treats rosacea. It belongs to a group of medications called tetracycline antibiotics. ?This medicine may be used for other purposes; ask your health care provider or pharmacist if you have questions. ?COMMON BRAND NAME(S): Oracea ?What should I tell my care team before I take this medication? ?They need to know if you have any of these conditions: ?Kidney disease ?Liver disease ?Long exposure to sunlight like working outdoors ?Recent stomach surgery ?Stomach or intestine problems such as colitis ?Vision Problems ?Yeast or fungal infection of the mouth or vagina ?An unusual or allergic reaction to doxycycline, tetracycline antibiotics, other medications, foods, dyes, or preservatives ?Pregnant or trying to get pregnant ?Breast-feeding ?How should I use this medication? ?Take this medication by mouth with a full glass of water. Take it on an empty stomach, at least 1 hour before and 2 hours after food. Take it as directed on the prescription label at the same time every day. Do not cut, crush or chew this medication. Swallow the capsules whole. Take all of this medication unless your care team tells you to stop it early. Keep taking it even if you think you are better. ?Take antacids and products with aluminum, calcium, magnesium, iron, and zinc in them at a different time of day than this medication. Talk to your care team if you have questions. ?Talk to your care team about the use of this medication in children. Special care may be needed. ?Overdosage: If you think you have taken too much of this medicine contact a poison control center or emergency room at once. ?NOTE: This medicine is only for you. Do not share this medicine with others. ?What if I miss a dose? ?If you miss a dose, take it as soon as you can. If it is almost time for your next dose, take only that dose. Do not take double or extra doses. ?What  may interact with this medication? ?Antacids, vitamins, or other products that contain aluminum, calcium, iron, magnesium, or zinc ?Barbiturates ?Birth control pills ?Bismuth subsalicylate ?Carbamazepine ?Methoxyflurane ?Oral retinoids such as acitretin, isotretinoin ?Other antibiotics ?Phenytoin ?Warfarin ?This list may not describe all possible interactions. Give your health care provider a list of all the medicines, herbs, non-prescription drugs, or dietary supplements you use. Also tell them if you smoke, drink alcohol, or use illegal drugs. Some items may interact with your medicine. ?What should I watch for while using this medication? ?Tell your care team if your symptoms do not improve. ?Do not treat diarrhea with over the counter products. Contact your care team if you have diarrhea that lasts more than 2 days or if it is severe and watery. ?Do not take this medication just before going to bed. It may not dissolve properly when you lay down and can cause pain in your throat. Drink plenty of fluids while taking this medication to also help reduce irritation in your throat. ?This medication can make you more sensitive to the sun. Keep out of the sun. If you cannot avoid being in the sun, wear protective clothing and use sunscreen. Do not use sun lamps or tanning beds/booths. ?Birth control pills may not work properly while you are taking this medication. Talk to your care team about using an extra method of birth control. ?What side effects may I notice from receiving this medication? ?Side effects that you should report to your care team as soon as possible: ?Allergic reactions--skin rash, itching, hives,  swelling of the face, lips, tongue, or throat ?Increased pressure around the brain--severe headache, change in vision, blurry vision, nausea, vomiting ?Joint pain ?Pain or trouble swallowing ?Redness, blistering, peeling, or loosening of the skin, including inside the mouth ?Severe diarrhea, fever ?Unusual  vaginal discharge, itching, or odor ?Side effects that usually do not require medical attention (report these to your care team if they continue or are bothersome): ?Change in tooth color ?Diarrhea ?Headache ?Heartburn ?Nausea ?This list may not describe all possible side effects. Call your doctor for medical advice about side effects. You may report side effects to FDA at 1-800-FDA-1088. ?Where should I keep my medication? ?Keep out of the reach of children and pets. ?Store at room temperature between 15 and 30 degrees C (59 and 86 degrees F). Protect from light. Keep the container tightly closed. Get rid of any unused medication after the expiration date. ?To get rid of medications that are no longer needed or have expired: ?Take the medication to a medication take-back program. Check with your pharmacy or law enforcement to find a location. ?If you cannot return the medication, check the label or package insert to see if the medication should be thrown out in the garbage or flushed down the toilet. If you are not sure, ask your care team. If it is safe to put it in the trash, pour the medication out of the container. Mix the medication with cat litter, dirt, coffee grounds, or other unwanted substance. Seal the mixture in a bag or container. Put it in the trash. ?NOTE: This sheet is a summary. It may not cover all possible information. If you have questions about this medicine, talk to your doctor, pharmacist, or health care provider. ?? 2022 Elsevier/Gold Standard (2020-07-17 00:00:00) ?Benzonatate Capsules ?What is this medication? ?BENZONATATE (ben ZOE na tate) is used to relieve cough. It works by calming your cough reflex. It belongs to a group of medications called cough suppressants. ?This medicine may be used for other purposes; ask your health care provider or pharmacist if you have questions. ?COMMON BRAND NAME(S): Tessalon Perles, Zonatuss ?What should I tell my care team before I take this  medication? ?They need to know if you have any of these conditions: ?Kidney or liver disease ?An unusual or allergic reaction to benzonatate, anesthetics, other medications, foods, dyes, or preservatives ?Pregnant or trying to get pregnant ?Breast-feeding ?How should I use this medication? ?Take this medication by mouth with a glass of water. Follow the directions on the prescription label. Avoid breaking, chewing, or sucking the capsule, as this can cause serious side effects. Take your medication at regular intervals. Do not take your medication more often than directed. ?Talk to your care team about the use of this medication in children. While this medication may be prescribed for children as young as 51 years old for selected conditions, precautions do apply. ?Overdosage: If you think you have taken too much of this medicine contact a poison control center or emergency room at once. ?NOTE: This medicine is only for you. Do not share this medicine with others. ?What if I miss a dose? ?If you miss a dose, take it as soon as you can. If it is almost time for your next dose, take only that dose. Do not take double or extra doses. ?What may interact with this medication? ?Do not take this medication with any of the following: ?MAOIs like Carbex, Eldepryl, Marplan, Nardil, and Parnate ?This list may not describe all possible interactions. Give  your health care provider a list of all the medicines, herbs, non-prescription drugs, or dietary supplements you use. Also tell them if you smoke, drink alcohol, or use illegal drugs. Some items may interact with your medicine. ?What should I watch for while using this medication? ?Tell your care team if your symptoms do not improve or if they get worse. If you have a high fever, skin rash, or headache, see your care team. ?You may get drowsy or dizzy. Do not drive, use machinery, or do anything that needs mental alertness until you know how this medication affects you. Do not  sit or stand up quickly, especially if you are an older patient. This reduces the risk of dizzy or fainting spells. ?What side effects may I notice from receiving this medication? ?Side effects that you should

## 2021-08-18 ENCOUNTER — Telehealth (INDEPENDENT_AMBULATORY_CARE_PROVIDER_SITE_OTHER): Payer: BC Managed Care – PPO | Admitting: Internal Medicine

## 2021-08-18 ENCOUNTER — Encounter: Payer: Self-pay | Admitting: Internal Medicine

## 2021-08-18 ENCOUNTER — Other Ambulatory Visit: Payer: Self-pay

## 2021-08-18 VITALS — Ht 63.0 in | Wt 215.0 lb

## 2021-08-18 DIAGNOSIS — J0110 Acute frontal sinusitis, unspecified: Secondary | ICD-10-CM

## 2021-08-18 DIAGNOSIS — J029 Acute pharyngitis, unspecified: Secondary | ICD-10-CM

## 2021-08-18 DIAGNOSIS — B3731 Acute candidiasis of vulva and vagina: Secondary | ICD-10-CM

## 2021-08-18 DIAGNOSIS — R051 Acute cough: Secondary | ICD-10-CM

## 2021-08-18 LAB — POCT RAPID STREP A (OFFICE): Rapid Strep A Screen: NEGATIVE

## 2021-08-18 MED ORDER — LEVOFLOXACIN 750 MG PO TABS: 750.0000 mg | ORAL_TABLET | Freq: Every day | ORAL | 0 refills | Status: DC

## 2021-08-18 MED ORDER — HYDROCODONE BIT-HOMATROP MBR 5-1.5 MG/5ML PO SOLN: 5.0000 mL | Freq: Every evening | ORAL | 0 refills | Status: DC | PRN

## 2021-08-18 MED ORDER — FLUCONAZOLE 150 MG PO TABS: 150.0000 mg | ORAL_TABLET | Freq: Once | ORAL | 0 refills | Status: AC

## 2021-08-18 MED ORDER — PREDNISONE 20 MG PO TABS: 40.0000 mg | ORAL_TABLET | Freq: Every day | ORAL | 0 refills | Status: DC

## 2021-08-18 NOTE — Progress Notes (Signed)
Virtual Visit via Video Note ? ?I connected with Angela Mccormick ? on 08/18/21 at  9:40 AM EDT by a video enabled telemedicine application and verified that I am speaking with the correct person using two identifiers. ? Location patient: [place] ?Location provider:work or home office ?Persons participating in the virtual visit: patient, provider ? ?I discussed the limitations and requested verbal permission for telemedicine visit. The patient expressed understanding and agreed to proceed. ? ? ?HPI: ?Acute telemedicine visit for : ?Sick visit onset Monday with cough, congestion, sore throat fatigue loss of taste no prior testing at home fever today 100.9 has 2 small kids in daycare seen UC given tessalon, doxycycline and not better son is coughing. She denies wheezing/chest tightness  ? ?-Pertinent past medical history: see below ?-Pertinent medication allergies: ?Allergies  ?Allergen Reactions  ? Sumatriptan Succinate Anxiety  ? Other   ?  dissolvable staples - body rejects  ? ?-COVID-19 vaccine status:  ?Immunization History  ?Administered Date(s) Administered  ? Influenza,inj,Quad PF,6+ Mos 03/05/2017, 01/28/2018, 01/17/2019, 03/05/2020, 01/10/2021  ? Influenza-Unspecified 03/05/2017, 01/28/2018, 01/17/2019  ? PFIZER(Purple Top)SARS-COV-2 Vaccination 07/03/2020, 07/24/2020  ? Tdap 07/06/2014, 03/14/2019  ? ? ? ?ROS: See pertinent positives and negatives per HPI. ? ?Past Medical History:  ?Diagnosis Date  ? Asthma   ? Depression   ? Gestational diabetes   ? History of Chiari malformation   ? History of syncope   ? Migraine   ? ? ?Past Surgical History:  ?Procedure Laterality Date  ? CESAREAN SECTION N/A 12/17/2017  ? Procedure: CESAREAN SECTION;  Surgeon: Vena Austria, MD;  Location: [facility] ORS;  Service: Obstetrics;  Laterality: N/A;  ? CESAREAN SECTION WITH BILATERAL TUBAL LIGATION  04/06/2019  ? Procedure: CESAREAN SECTION WITH BILATERAL TUBAL LIGATION;  Surgeon: Vena Austria, MD;  Location: [facility] OR[facility];  Service:  Obstetrics;;  ? FLEXIBLE SIGMOIDOSCOPY N/A 03/12/2018  ? Procedure: FLEXIBLE SIGMOIDOSCOPY;  Surgeon: Toney Reil, MD;  Location: [facility];  Service: Gastroenterology;  Laterality: N/A;  ? NO PAST SURGERIES    ? WISDOM TOOTH EXTRACTION    ? ? ? ?Current Outpatient Medications:  ?  albuterol (VENTOLIN HFA) 108 (90 Base) MCG/ACT inhaler, Inhale 2 puffs into the lungs every 6 (six) hours as needed for wheezing or shortness of breath., Disp: 8 g, Rfl: 0 ?  benzonatate (TESSALON) 100 MG capsule, Take 1 capsule (100 mg total) by mouth 2 (two) times daily as needed for cough., Disp: 20 capsule, Rfl: 0 ?  doxycycline (VIBRA-TABS) 100 MG tablet, Take 1 tablet (100 mg total) by mouth 2 (two) times daily for 10 days., Disp: 20 tablet, Rfl: 0 ?  HYDROcodone bit-homatropine (HYCODAN) 5-1.5 MG/5ML syrup, Take 5 mLs by mouth at bedtime as needed for cough., Disp: 120 mL, Rfl: 0 ?  levofloxacin (LEVAQUIN) 750 MG tablet, Take 1 tablet (750 mg total) by mouth daily. X 5-7 days with food, Disp: 7 tablet, Rfl: 0 ?  Multiple Vitamins-Minerals (EQ MULTIVITAMINS ADULT GUMMY PO), Take one gummy by mouth daily., Disp: , Rfl:  ?  nystatin (MYCOSTATIN/NYSTOP) powder, Apply 1 application topically 3 (three) times daily., Disp: 15 g, Rfl: 0 ?  predniSONE (DELTASONE) 20 MG tablet, Take 2 tablets (40 mg total) by mouth daily with breakfast. X 5-7 days with food, Disp: 14 tablet, Rfl: 0 ?  sertraline (ZOLOFT) 100 MG tablet, Take 1.5 tablets (150 mg total) by mouth daily., Disp: 135 tablet, Rfl: 2 ?  testosterone cypionate (DEPOTESTOSTERONE CYPIONATE) 200 MG/ML injection, Inject into the muscle.,  Disp: , Rfl:  ?  ciprofloxacin-dexamethasone (CIPRODEX) OTIC suspension, Place 4 drops into both ears 2 (two) times daily. X 4-7 days (Patient not taking: Reported on 08/18/2021), Disp: 7.5 mL, Rfl: 0 ?  meclizine (ANTIVERT) 25 MG tablet, Take 1 tablet (25 mg total) by mouth 3 (three) times daily as needed for dizziness. (Patient not taking:  Reported on 08/18/2021), Disp: 30 tablet, Rfl: 0 ? ?EXAM: ? ?VITALS per patient if applicable: ? ?GENERAL: alert, oriented, appears well and in no acute distress ? ?HEENT: atraumatic, conjunttiva clear, no obvious abnormalities on inspection of external nose and ears ? ?NECK: normal movements of the head and neck ? ?LUNGS: on inspection no signs of respiratory distress, breathing rate appears normal, no obvious gross SOB, gasping or wheezing ? ?CV: no obvious cyanosis ? ?MS: moves all visible extremities without noticeable abnormality ? ?PSYCH/NEURO: pleasant and cooperative, no obvious depression or anxiety, speech and thought processing grossly intact ? ?ASSESSMENT AND PLAN: ? ?Discussed the following assessment and plan: ? ?Sore throat - Plan: POCT rapid strep A, COVID-19, Flu A+B and RSV all negative, levofloxacin (LEVAQUIN) 750 MG tablet failed doxycycline  ? ?Acute cough - Plan: HYDROcodone bit-homatropine (HYCODAN) 5-1.5 MG/5ML syrup, predniSONE (DELTASONE) 20 MG tablet ? ?Acute frontal sinusitis, recurrence not specified - Plan: levofloxacin (LEVAQUIN) 750 MG tablet, predniSONE (DELTASONE) 20 MG tablet ? ? ?These are over the counter medication options:  ?Mucinex dm green label for cough or robitussin DM  ?Multivitamin or below vitamins  ?Vitamin C 1000 mg daily.  ?Vitamin D3 4000 Iu (units) daily.  ?Zinc 100 mg daily.  ?Quercetin 250-500 mg 2 times per day   ?Elderberry  ?Oil of oregano  ?cepacol or chloroseptic spray ?Warm salt water gargles +hydrogen peroxide ?Sugar free cough drops  ?Warm tea with honey and lemon  ?Hydration  ?Try to eat though you dont feel like it   ?Tylenol or Advil  ?Nasal saline and Flonase 2 sprays nasal congestion  ?If sneezing/runny nose over the counter allergy pill claritin,allegra, zyrtec, xyzal ?Quarantine x 10-14 days 14 days preferred  ? ?Monitor pulse oximeter, buy from [facility] if oxygen is less than 90 please go to the hospital.  ?   ?   ?Are you feeling really sick?  Shortness of breath, cough, chest pain?, dizziness? Confusion  ? If so let me know  ?If worsening, go to hospital or [facility] clinic Urgent care for further treatment. ? ?    ?Yeast vaginitis - Plan: fluconazole (DIFLUCAN) 150 MG tablet ? ?-we discussed possible serious and likely etiologies, options for evaluation and workup, limitations of telemedicine visit vs in person visit, treatment, treatment risks and precautions. Pt is agreeable to treatment via telemedicine at this moment.  ? ? ?  ?I discussed the assessment and treatment plan with the patient. The patient was provided an opportunity to ask questions and all were answered. The patient agreed with the plan and demonstrated an understanding of the instructions. ?  ? ?Time spent 20 minutes ?Pasty Spillers McLean-Scocuzza, MD   ?

## 2021-08-19 LAB — COVID-19, FLU A+B AND RSV
Influenza A, NAA: NOT DETECTED
Influenza B, NAA: NOT DETECTED
RSV, NAA: NOT DETECTED
SARS-CoV-2, NAA: NOT DETECTED

## 2021-09-07 DIAGNOSIS — F5222 Female sexual arousal disorder: Secondary | ICD-10-CM | POA: Diagnosis not present

## 2021-09-27 ENCOUNTER — Encounter: Payer: Self-pay | Admitting: Family Medicine

## 2021-09-27 ENCOUNTER — Ambulatory Visit (INDEPENDENT_AMBULATORY_CARE_PROVIDER_SITE_OTHER): Payer: BC Managed Care – PPO | Admitting: Family Medicine

## 2021-09-27 DIAGNOSIS — E669 Obesity, unspecified: Secondary | ICD-10-CM | POA: Diagnosis not present

## 2021-09-27 DIAGNOSIS — F419 Anxiety disorder, unspecified: Secondary | ICD-10-CM | POA: Diagnosis not present

## 2021-09-27 DIAGNOSIS — F32A Depression, unspecified: Secondary | ICD-10-CM | POA: Diagnosis not present

## 2021-09-27 DIAGNOSIS — L299 Pruritus, unspecified: Secondary | ICD-10-CM | POA: Diagnosis not present

## 2021-09-27 NOTE — Assessment & Plan Note (Signed)
Asymptomatic.  The patient is ready to come off of medication.  She will decrease her Zoloft to 50 mg once daily for 14 days followed by 50 mg every other day for 14 days and then discontinue.  If she has any worsening symptoms of anxiety or depression or if she notices any withdrawal symptoms she will let us know. ?

## 2021-09-27 NOTE — Progress Notes (Signed)
?[name], MD ?Phone: [phone] ? ?Angela Mccormick is a 36 y.o. female who presents today for follow-up. ? ?Anxiety/depression: Patient notes no anxiety or depression.  No SI.  She is ready to come off of Zoloft. ? ?Obesity: Patient notes she goes to the gym once a week.  She chases her kids around.  She has thought about going to the gym more frequently though her life gets in the way.  She oftentimes will only eat 1 meal a day.  She does generally get fruits and vegetables.  She has cut her soda intake down to 1/day. ? ?Ear itching: Patient notes the right ear has been itching her.  The symptoms are external.  She was using Ciprodex previously for an external ear infection though she used those drops recently and they did not help with her current itching. ? ?Social History  ? ?Tobacco Use  ?Smoking Status Never  ?Smokeless Tobacco Never  ? ? ?Current Outpatient Medications on File Prior to Visit  ?Medication Sig Dispense Refill  ? albuterol (VENTOLIN HFA) 108 (90 Base) MCG/ACT inhaler Inhale 2 puffs into the lungs every 6 (six) hours as needed for wheezing or shortness of breath. 8 g 0  ? Multiple Vitamins-Minerals (EQ MULTIVITAMINS ADULT GUMMY PO) Take one gummy by mouth daily.    ? nystatin (MYCOSTATIN/NYSTOP) powder Apply 1 application topically 3 (three) times daily. 15 g 0  ? testosterone cypionate (DEPOTESTOSTERONE CYPIONATE) 200 MG/ML injection Inject into the muscle.    ? ?No current facility-administered medications on file prior to visit.  ? ? ? ?ROS see history of present illness ? ?Objective ? ?Physical Exam ?Vitals:  ? 09/27/21 1223  ?BP: 110/70  ?Pulse: 67  ?Temp: 98 ?F (36.7 ?C)  ?SpO2: 99%  ? ? ?BP Readings from Last 3 Encounters:  ?09/27/21 110/70  ?07/12/21 110/70  ?07/04/21 128/81  ? ?Wt Readings from Last 3 Encounters:  ?09/27/21 213 lb 3.2 oz (96.7 kg)  ?08/18/21 215 lb (97.5 kg)  ?07/12/21 212 lb 9.6 oz (96.4 kg)  ? ? ?Physical Exam ?Constitutional:   ?   General: She is not in  acute distress. ?   Appearance: She is not diaphoretic.  ?HENT:  ?   Right Ear: Tympanic membrane and ear canal normal.  ?   Left Ear: Tympanic membrane and ear canal normal.  ?   Ears:  ?   Comments: Right ear just external to the canal with flaking of the external ear ?Cardiovascular:  ?   Rate and Rhythm: Normal rate and regular rhythm.  ?   Heart sounds: Normal heart sounds.  ?Pulmonary:  ?   Effort: Pulmonary effort is normal.  ?   Breath sounds: Normal breath sounds.  ?Skin: ?   General: Skin is warm and dry.  ?Neurological:  ?   Mental Status: She is alert.  ? ? ? ?Assessment/Plan: Please see individual problem list. ? ?Problem List Items Addressed This Visit   ? ? Anxiety and depression (Chronic)  ?  Asymptomatic.  The patient is ready to come off of medication.  She will decrease her Zoloft to 50 mg once daily for 14 days followed by 50 mg every other day for 14 days and then discontinue.  If she has any worsening symptoms of anxiety or depression or if she notices any withdrawal symptoms she will let us know. ? ?  ?  ? Obesity (BMI 30.0-34.9) (Chronic)  ?  I encouraged increased exercise.  Discussed trying to  get more than 1 meal per day with some protein earlier in the day. ? ?  ?  ? Ear itching  ?  Possibly related to an eczematous type issue particularly given that the itching and flaking of skin is external.  She will trial topical hydrocortisone over-the-counter and see if that is beneficial.  If that does not help within the next few days she will let us know. ? ?  ?  ? ? ? ?Return in about 1 year (around 09/28/2022). ? ? ?[name], MD ?[facility] [place] ? ?

## 2021-09-27 NOTE — Assessment & Plan Note (Signed)
I encouraged increased exercise.  Discussed trying to get more than 1 meal per day with some protein earlier in the day. ?

## 2021-09-27 NOTE — Patient Instructions (Addendum)
Nice to see you. ?Please reduce your Zoloft dose to 50 mg (half a tablet) by mouth daily for 14 days.  Then you will take 50 mg (half a tablet) by mouth every other day for 14 days.  At that point you can discontinue the Zoloft.  If you notice any worsening anxiety or depression or if you notice any symptoms that may be related to withdrawal from the Zoloft please let us know. ?Please try over-the-counter hydrocortisone on your external right ear to see if that helps with the rash and itching.  If it does not please let me know. ?

## 2021-09-27 NOTE — Assessment & Plan Note (Signed)
Possibly related to an eczematous type issue particularly given that the itching and flaking of skin is external.  She will trial topical hydrocortisone over-the-counter and see if that is beneficial.  If that does not help within the next few days she will let us know. ?

## 2021-10-03 ENCOUNTER — Ambulatory Visit: Payer: BC Managed Care – PPO | Admitting: Family Medicine

## 2021-10-07 DIAGNOSIS — F5222 Female sexual arousal disorder: Secondary | ICD-10-CM | POA: Diagnosis not present

## 2021-11-11 DIAGNOSIS — F5222 Female sexual arousal disorder: Secondary | ICD-10-CM | POA: Diagnosis not present

## 2021-11-16 DIAGNOSIS — H6063 Unspecified chronic otitis externa, bilateral: Secondary | ICD-10-CM | POA: Diagnosis not present

## 2021-11-28 ENCOUNTER — Ambulatory Visit
Admission: EM | Admit: 2021-11-28 | Discharge: 2021-11-28 | Disposition: A | Discharge status: Home / Self Care | Payer: BC Managed Care – PPO | Attending: Emergency Medicine | Admitting: Emergency Medicine

## 2021-11-28 ENCOUNTER — Encounter: Payer: Self-pay | Admitting: Emergency Medicine

## 2021-11-28 DIAGNOSIS — T63441A Toxic effect of venom of bees, accidental (unintentional), initial encounter: Secondary | ICD-10-CM

## 2021-11-28 MED ORDER — PREDNISONE 10 MG (21) PO TBPK: ORAL_TABLET | Freq: Every day | ORAL | 0 refills | Status: DC

## 2021-11-28 NOTE — ED Provider Notes (Signed)
Renaldo Fiddler    CSN: [id number] Arrival date & time: 11/28/21  1405      History   Chief Complaint Chief Complaint  Patient presents with   Insect Bite    HPI Angela Mccormick is a 36 y.o. female.   Patient presents with erythema, swelling and pruritus to the site of a yellowjacket sting that occurred 7 days ago.  Pruritus has been persistent.  Endorses that she woke up this morning with new swelling to the site which is since resolved.  Has attempted use of cortisone 10 which has been minimally effective.  Denies respiratory symptoms, drainage, fever or chills.  Past Medical History:  Diagnosis Date   Asthma    Depression    Gestational diabetes    History of Chiari malformation    History of syncope    Migraine     Patient Active Problem List   Diagnosis Date Noted   Ear itching 09/27/2021   Urine frequency 04/05/2021   Right knee pain 01/10/2021   Low libido 01/10/2021   Hypersomnia 10/08/2020   Abnormal menses 07/01/2020   Congestion of nasal sinus 04/19/2020   Rash 04/19/2020   Routine general medical examination at a health care facility 03/05/2020   Anxiety and depression 03/05/2020   Obesity (BMI 30.0-34.9) 03/05/2020   Asthma 12/08/2019   Dizziness 09/08/2019   Rectal bleeding 03/04/2018   Nodule of neck 11/01/2016   Chiari malformation (HCC) 09/26/2012   Migraines 09/26/2012    Past Surgical History:  Procedure Laterality Date   CESAREAN SECTION N/A 12/17/2017   Procedure: CESAREAN SECTION;  Surgeon: Vena Austria, MD;  Location: [facility] ORS;  Service: Obstetrics;  Laterality: N/A;   CESAREAN SECTION WITH BILATERAL TUBAL LIGATION  04/06/2019   Procedure: CESAREAN SECTION WITH BILATERAL TUBAL LIGATION;  Surgeon: Vena Austria, MD;  Location: [facility] ORS;  Service: Obstetrics;;   FLEXIBLE SIGMOIDOSCOPY N/A 03/12/2018   Procedure: FLEXIBLE SIGMOIDOSCOPY;  Surgeon: Toney Reil, MD;  Location: [facility] ENDOSCOPY;  Service:  Gastroenterology;  Laterality: N/A;   NO PAST SURGERIES     WISDOM TOOTH EXTRACTION      OB History     Gravida  2   Para  2   Term  0   Preterm  2   AB      Living  2      SAB      IAB      Ectopic      Multiple  0   Live Births  2            Home Medications    Prior to Admission medications   Medication Sig Start Date End Date Taking? Authorizing Provider  predniSONE (STERAPRED UNI-PAK 21 TAB) 10 MG (21) TBPK tablet Take by mouth daily. Take 6 tabs by mouth daily  for 2 days, then 5 tabs for 2 days, then 4 tabs for 2 days, then 3 tabs for 2 days, 2 tabs for 2 days, then 1 tab by mouth daily for 2 days 11/28/21  Yes Kaleth Koy R, NP  albuterol (VENTOLIN HFA) 108 (90 Base) MCG/ACT inhaler Inhale 2 puffs into the lungs every 6 (six) hours as needed for wheezing or shortness of breath. 02/11/21   Glori Luis, MD  Multiple Vitamins-Minerals (EQ MULTIVITAMINS ADULT GUMMY PO) Take one gummy by mouth daily.    [provider]  nystatin (MYCOSTATIN/NYSTOP) powder Apply 1 application topically 3 (three) times daily. 02/11/21   Glori Luis,  MD  testosterone cypionate (DEPOTESTOSTERONE CYPIONATE) 200 MG/ML injection Inject into the muscle. 12/02/20   [provider]    Family History Family History  Problem Relation Age of Onset   Colon cancer Mother    Diabetes Mother    Stroke Mother    Diabetes Father    Stroke Maternal Grandfather    Heart disease Maternal Grandfather        CABG   Stroke Maternal Aunt     Social History Social History   Tobacco Use   Smoking status: Never   Smokeless tobacco: Never  Vaping Use   Vaping Use: Never used  Substance Use Topics   Alcohol use: Not Currently    Comment: 1 drink/year   Drug use: No     Allergies   Sumatriptan succinate and Other   Review of Systems Review of Systems  Constitutional: Negative.   Respiratory: Negative.    Cardiovascular: Negative.   Skin:   Positive for wound. Negative for color change, pallor and rash.     Physical Exam Triage Vital Signs ED Triage Vitals  Enc Vitals Group     BP 11/28/21 1430 125/78     Pulse Rate 11/28/21 1430 82     Resp 11/28/21 1430 16     Temp 11/28/21 1430 98.6 F (37 C)     Temp Source 11/28/21 1430 Oral     SpO2 11/28/21 1430 98 %     Weight --      Height --      Head Circumference --      Peak Flow --      Pain Score 11/28/21 1431 0     Pain Loc --      Pain Edu? --      Excl. in [place]? --    No data found.  Updated Vital Signs BP 125/78 (BP Location: Left Arm)   Pulse 82   Temp 98.6 F (37 C) (Oral)   Resp 16   LMP 11/03/2021   SpO2 98%   Visual Acuity Right Eye Distance:   Left Eye Distance:   Bilateral Distance:    Right Eye Near:   Left Eye Near:    Bilateral Near:     Physical Exam Constitutional:      Appearance: Normal appearance.  HENT:     Head: Normocephalic.  Eyes:     Extraocular Movements: Extraocular movements intact.  Pulmonary:     Effort: Pulmonary effort is normal.  Skin:    Comments: Less than 0.5 cm puncture wound to the left breast with surrounding erythema, no swelling noted, nondraining, nontender  Neurological:     Mental Status: She is alert and oriented to person, place, and time. Mental status is at baseline.  Psychiatric:        Mood and Affect: Mood normal.        Behavior: Behavior normal.      UC Treatments / Results  Labs (all labs ordered are listed, but only abnormal results are displayed) Labs Reviewed - No data to display  EKG   Radiology No results found.  Procedures Procedures (including critical care time)  Medications Ordered in UC Medications - No data to display  Initial Impression / Assessment and Plan / UC Course  I have reviewed the triage vital signs and the nursing notes.  Pertinent labs & imaging results that were available during my care of the patient were reviewed by me and considered in my  medical decision  making (see chart for details).  Allergic reaction to bee sting  Patient shows patient from this morning where hives are present surrounding the bee sting, with associated pruritus and erythema most likely a late reaction, no respiratory involvement at this time, discussed with patient, prednisone 60 mg taper prescribed, may use oral or topical antihistamines and calamine lotion for additional pruritus, may continue use of hydrocortisone cream if helpful, given strict precautions to return to urgent care for persisting or worsening symptoms Final Clinical Impressions(s) / UC Diagnoses   Final diagnoses:  Allergic reaction to bee sting     Discharge Instructions      Today you are being treated for allergic reaction to the yellowjacket sting  Begin use of prednisone every morning with food for the next 12 days, this will help to reduce the inflammatory process that is occurring which in turn should help clear the reddened area and minimize your itching  Is the itching you may take a daily Claritin or Zyrtec, you may also try topical Benadryl cream or calamine lotion  Avoid long exposure to heat in the sun or in the shower as this may cause irritation to the site  Please follow-up with urgent care with your primary doctor if symptoms continue to persist or worsen   ED Prescriptions     Medication Sig Dispense Auth. Provider   predniSONE (STERAPRED UNI-PAK 21 TAB) 10 MG (21) TBPK tablet Take by mouth daily. Take 6 tabs by mouth daily  for 2 days, then 5 tabs for 2 days, then 4 tabs for 2 days, then 3 tabs for 2 days, 2 tabs for 2 days, then 1 tab by mouth daily for 2 days 42 tablet Harlowe Dowler, Elita Boone, NP      PDMP not reviewed this encounter.   Valinda Hoar, NP 11/28/21 1449

## 2021-11-28 NOTE — Discharge Instructions (Signed)
Today you are being treated for allergic reaction to the yellowjacket sting  Begin use of prednisone every morning with food for the next 12 days, this will help to reduce the inflammatory process that is occurring which in turn should help clear the reddened area and minimize your itching  Is the itching you may take a daily Claritin or Zyrtec, you may also try topical Benadryl cream or calamine lotion  Avoid long exposure to heat in the sun or in the shower as this may cause irritation to the site  Please follow-up with urgent care with your primary doctor if symptoms continue to persist or worsen

## 2021-11-28 NOTE — ED Triage Notes (Signed)
Pt was stung by a yellow jacket on her left breast 1 week ago. She has some burning and itching at the site.

## 2021-11-29 DIAGNOSIS — Z01419 Encounter for gynecological examination (general) (routine) without abnormal findings: Secondary | ICD-10-CM | POA: Diagnosis not present

## 2021-11-29 DIAGNOSIS — F5222 Female sexual arousal disorder: Secondary | ICD-10-CM | POA: Diagnosis not present

## 2021-12-06 DIAGNOSIS — F5222 Female sexual arousal disorder: Secondary | ICD-10-CM | POA: Diagnosis not present

## 2021-12-09 DIAGNOSIS — F5222 Female sexual arousal disorder: Secondary | ICD-10-CM | POA: Diagnosis not present

## 2022-01-02 ENCOUNTER — Encounter: Payer: Self-pay | Admitting: Internal Medicine

## 2022-01-02 ENCOUNTER — Ambulatory Visit (INDEPENDENT_AMBULATORY_CARE_PROVIDER_SITE_OTHER): Payer: BC Managed Care – PPO | Admitting: Internal Medicine

## 2022-01-02 VITALS — BP 108/64 | HR 78 | Temp 97.3°F | Ht 63.0 in | Wt 207.4 lb

## 2022-01-02 DIAGNOSIS — F32A Depression, unspecified: Secondary | ICD-10-CM

## 2022-01-02 DIAGNOSIS — F419 Anxiety disorder, unspecified: Secondary | ICD-10-CM | POA: Diagnosis not present

## 2022-01-02 DIAGNOSIS — Z23 Encounter for immunization: Secondary | ICD-10-CM

## 2022-01-02 MED ORDER — BUPROPION HCL ER (XL) 150 MG PO TB24: 150.0000 mg | ORAL_TABLET | Freq: Every day | ORAL | 2 refills | Status: DC

## 2022-01-02 NOTE — Assessment & Plan Note (Addendum)
Diagnosed 4 years ago after finding mother/father in law murdered/suicide,  Followed by [place] of oldest daughter.  Now Complicated by loss of father 4 weeks ago who died in Hospice of a brain tumor.  She  Has not taken sertraline since May .  Low energy,  Anhedonia,  Lack of concentration .  Trial of wellbutrin starting at 150 mg once daily.  Will notify me /ES in 2 weeks if dose needs adjusting  .  Encouraged her to use the grief counselling available through [facility]

## 2022-01-02 NOTE — Progress Notes (Signed)
Subjective:  Patient ID: Angela Mccormick, female    DOB: Dec 06, 1985  Age: 36 y.o. MRN: [id number]  CC: The encounter diagnosis was Anxiety and depression.   HPI Angela Mccormick presents for management of grief  Chief Complaint  Patient presents with   Depression   grief    Loss dad 4 weeks ago.     Father , 77,  died 4 weeks ago, brain tumor.  Children (2) in daycare  History of depression for 4 years  triggered by complicated grief following the violent death of mother /suicide of father in law,  complicated by pregnancy . Has had distractions until now. Father died 12-30-2022 spent the following 2 weeks at the beach ,  then came home to emptiness and grief .  Has had diminished appettie,  low energy,  anhedonia. Has been off of zoloft since May .  No prior trial of wellbutrin.  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   No destructive habits .      Outpatient Medications Prior to Visit  Medication Sig Dispense Refill   albuterol (VENTOLIN HFA) 108 (90 Base) MCG/ACT inhaler Inhale 2 puffs into the lungs every 6 (six) hours as needed for wheezing or shortness of breath. 8 g 0   Multiple Vitamins-Minerals (EQ MULTIVITAMINS ADULT GUMMY PO) Take one gummy by mouth daily.     nystatin (MYCOSTATIN/NYSTOP) powder Apply 1 application topically 3 (three) times daily. 15 g 0   testosterone cypionate (DEPOTESTOSTERONE CYPIONATE) 200 MG/ML injection Inject into the muscle.     predniSONE (STERAPRED UNI-PAK 21 TAB) 10 MG (21) TBPK tablet Take by mouth daily. Take 6 tabs by mouth daily  for 2 days, then 5 tabs for 2 days, then 4 tabs for 2 days, then 3 tabs for 2 days, 2 tabs for 2 days, then 1 tab by mouth daily for 2 days 42 tablet 0   No facility-administered medications prior to visit.    Review of Systems;  Patient denies headache, fevers, malaise, unintentional weight loss, skin rash, eye pain, sinus congestion and sinus pain, sore  throat, dysphagia,  hemoptysis , cough, dyspnea, wheezing, chest pain, palpitations, orthopnea, edema, abdominal pain, nausea, melena, diarrhea, constipation, flank pain, dysuria, hematuria, urinary  Frequency, nocturia, numbness, tingling, seizures,  Focal weakness, Loss of consciousness,  Tremor, insomnia,  anxiety, and suicidal ideation.      Objective:  BP 108/64 (BP Location: Left Arm, Patient Position: Sitting, Cuff Size: Large)   Pulse 78   Temp (!) 97.3 F (36.3 C) (Oral)   Ht 5\' 3"  (1.6 m)   Wt 207 lb 6.4 oz (94.1 kg)   SpO2 97%   BMI 36.74 kg/m   BP Readings from Last 3 Encounters:  01/02/22 108/64  11/28/21 125/78  09/27/21 110/70    Wt Readings from Last 3 Encounters:  01/02/22 207 lb 6.4 oz (94.1 kg)  09/27/21 213 lb 3.2 oz (96.7 kg)  08/18/21 215 lb (97.5 kg)    General appearance: alert, cooperative and appears sad   Back: symmetric, no curvature. ROM normal. No CVA tenderness. Lungs: clear to auscultation bilaterally Heart: regular rate and rhythm, S1, S2 normal, no murmur, click, rub or gallop Abdomen: soft, obese, -tender; bowel sounds normal; no masses,  no organomegaly Pulses: 2+ and symmetric Skin: Skin color, texture, turgor normal. No rashes or lesions Lymph nodes: Cervical, supraclavicular, and axillary nodes normal. Psych: affect sad, m but and  makes good eye contact. No fidgeting,  Smiles easily.  Some nervous/forced laughter at her own jokes. Denies suicidal thoughts    Lab Results  Component Value Date   HGBA1C 5.5 04/05/2021   HGBA1C 5.4 03/05/2020   HGBA1C 5.1 09/08/2019    Lab Results  Component Value Date   CREATININE 0.65 04/05/2021   CREATININE 0.69 06/27/2020   CREATININE 0.59 03/05/2020    Lab Results  Component Value Date   WBC 7.5 06/27/2020   HGB 11.8 (L) 06/27/2020   HCT 36.7 06/27/2020   PLT 321 06/27/2020   GLUCOSE 87 04/05/2021   CHOL 139 04/05/2021   TRIG 57.0 04/05/2021   HDL 45.60 04/05/2021   LDLCALC 82  04/05/2021   ALT 23 04/05/2021   AST 16 04/05/2021   NA 137 04/05/2021   K 3.8 04/05/2021   CL 103 04/05/2021   CREATININE 0.65 04/05/2021   BUN 9 04/05/2021   CO2 27 04/05/2021   TSH 2.93 07/01/2020   GLUF 94 11/19/2017   HGBA1C 5.5 04/05/2021    No results found.  Assessment & Plan:   Problem List Items Addressed This Visit     Anxiety and depression (Chronic)    Diagnosed 4 years ago after finding mother/father in law murdered/suicide,  Followed by Angela Mccormick of oldest daughter.  Now Complicated by loss of father 4 weeks ago who died in Hospice of a brain tumor.  She  Has not taken sertraline since May .  Low energy,  Anhedonia,  Lack of concentration .  Trial of wellbutrin starting at 150 mg once daily.  Will notify me /ES in 2 weeks if dose needs adjusting  .  Encouraged her to use the grief counselling available through Hospice       Relevant Medications   buPROPion (WELLBUTRIN XL) 150 MG 24 hr tablet    I spent a total of  21 minutes with this patient in a face to face visit on the date of this encounter reviewing the last office visit with ES, patient's home life, diet and eating habits, grief counselling,   and post visit ordering of testing and therapeutics.    Follow-up: No follow-ups on file.   Sherlene Shams, MD

## 2022-01-13 DIAGNOSIS — F5222 Female sexual arousal disorder: Secondary | ICD-10-CM | POA: Diagnosis not present

## 2022-01-17 DIAGNOSIS — F5222 Female sexual arousal disorder: Secondary | ICD-10-CM | POA: Diagnosis not present

## 2022-01-20 ENCOUNTER — Telehealth: Payer: BC Managed Care – PPO | Admitting: Family Medicine

## 2022-01-20 DIAGNOSIS — J019 Acute sinusitis, unspecified: Secondary | ICD-10-CM | POA: Diagnosis not present

## 2022-01-20 DIAGNOSIS — B9689 Other specified bacterial agents as the cause of diseases classified elsewhere: Secondary | ICD-10-CM

## 2022-01-20 MED ORDER — AMOXICILLIN-POT CLAVULANATE 875-125 MG PO TABS: 1.0000 | ORAL_TABLET | Freq: Two times a day (BID) | ORAL | 0 refills | Status: DC

## 2022-01-20 NOTE — Patient Instructions (Signed)

## 2022-01-20 NOTE — Progress Notes (Signed)
Virtual Visit Consent   Angela Mccormick, you are scheduled for a virtual visit with a [facility] provider today. Just as with appointments in the office, your consent must be obtained to participate. Your consent will be active for this visit and any virtual visit you may have with one of our providers in the next 365 days. If you have a MyChart account, a copy of this consent can be sent to you electronically.  As this is a virtual visit, video technology does not allow for your provider to perform a traditional examination. This may limit your provider's ability to fully assess your condition. If your provider identifies any concerns that need to be evaluated in person or the need to arrange testing (such as labs, EKG, etc.), we will make arrangements to do so. Although advances in technology are sophisticated, we cannot ensure that it will always work on either your end or our end. If the connection with a video visit is poor, the visit may have to be switched to a telephone visit. With either a video or telephone visit, we are not always able to ensure that we have a secure connection.  By engaging in this virtual visit, you consent to the provision of healthcare and authorize for your insurance to be billed (if applicable) for the services provided during this visit. Depending on your insurance coverage, you may receive a charge related to this service.  I need to obtain your verbal consent now. Are you willing to proceed with your visit today? Angela Mccormick has provided verbal consent on 01/20/2022 for a virtual visit (video or telephone). Angela Curio, FNP  Date: 01/20/2022 11:19 AM  Virtual Visit via Video Note   I, Angela Mccormick, connected with  Angela Mccormick  ([id number], 22-May-1985) on 01/20/22 at 11:15 AM EDT by a video-enabled telemedicine application and verified that I am speaking with the correct person using two identifiers.  Location: Patient: Virtual Visit Location Patient:  Home Provider: Virtual Visit Location Provider: Home Office   I discussed the limitations of evaluation and management by telemedicine and the availability of in person appointments. The patient expressed understanding and agreed to proceed.    History of Present Illness: Angela Mccormick is a 36 y.o. who identifies as a female who was assigned female at birth, and is being seen today for sinus pain and pressure under eyes bilat for over a week and worsening. Post nasal drainage yellow-green. No fever. Mild cough. No fever. Mild headache.   HPI: HPI  Problems:  Patient Active Problem List   Diagnosis Date Noted   Ear itching 09/27/2021   Urine frequency 04/05/2021   Right knee pain 01/10/2021   Low libido 01/10/2021   Hypersomnia 10/08/2020   Abnormal menses 07/01/2020   Congestion of nasal sinus 04/19/2020   Rash 04/19/2020   Routine general medical examination at a health care facility 03/05/2020   Anxiety and depression 03/05/2020   Obesity (BMI 30.0-34.9) 03/05/2020   Asthma 12/08/2019   Dizziness 09/08/2019   Rectal bleeding 03/04/2018   Nodule of neck 11/01/2016   Chiari malformation (HCC) 09/26/2012   Migraines 09/26/2012    Allergies:  Allergies  Allergen Reactions   Sumatriptan Succinate Anxiety   Other     dissolvable staples - body rejects   Medications:  Current Outpatient Medications:    amoxicillin-clavulanate (AUGMENTIN) 875-125 MG tablet, Take 1 tablet by mouth 2 (two) times daily., Disp: 20 tablet, Rfl: 0   albuterol (VENTOLIN HFA)  108 (90 Base) MCG/ACT inhaler, Inhale 2 puffs into the lungs every 6 (six) hours as needed for wheezing or shortness of breath., Disp: 8 g, Rfl: 0   buPROPion (WELLBUTRIN XL) 150 MG 24 hr tablet, Take 1 tablet (150 mg total) by mouth daily after lunch., Disp: 30 tablet, Rfl: 2   Multiple Vitamins-Minerals (EQ MULTIVITAMINS ADULT GUMMY PO), Take one gummy by mouth daily., Disp: , Rfl:    nystatin (MYCOSTATIN/NYSTOP) powder, Apply  1 application topically 3 (three) times daily., Disp: 15 g, Rfl: 0   testosterone cypionate (DEPOTESTOSTERONE CYPIONATE) 200 MG/ML injection, Inject into the muscle., Disp: , Rfl:   Observations/Objective: Patient is well-developed, well-nourished in no acute distress.  Resting comfortably  at home.  Head is normocephalic, atraumatic.  No labored breathing.  Speech is clear and coherent with logical content.  Patient is alert and oriented at baseline.    Assessment and Plan: 1. Acute bacterial sinusitis  Increase fluids, med use and side effects discussed, take with food and yogurt or probiotics. Urgent care if sx worsen. Continue allergy meds.   Follow Up Instructions: I discussed the assessment and treatment plan with the patient. The patient was provided an opportunity to ask questions and all were answered. The patient agreed with the plan and demonstrated an understanding of the instructions.  A copy of instructions were sent to the patient via MyChart unless otherwise noted below.   10  The patient was advised to call back or seek an in-person evaluation if the symptoms worsen or if the condition fails to improve as anticipated.  Time:  I spent 10 minutes with the patient via telehealth technology discussing the above problems/concerns.    Angela Curio, FNP

## 2022-02-03 ENCOUNTER — Encounter: Payer: Self-pay | Admitting: Emergency Medicine

## 2022-02-03 ENCOUNTER — Emergency Department
Admission: EM | Admit: 2022-02-03 | Discharge: 2022-02-03 | Disposition: A | Discharge status: Home / Self Care | Payer: BC Managed Care – PPO | Attending: Emergency Medicine | Admitting: Emergency Medicine

## 2022-02-03 ENCOUNTER — Other Ambulatory Visit: Payer: Self-pay

## 2022-02-03 DIAGNOSIS — R350 Frequency of micturition: Secondary | ICD-10-CM | POA: Diagnosis not present

## 2022-02-03 DIAGNOSIS — N3000 Acute cystitis without hematuria: Secondary | ICD-10-CM | POA: Insufficient documentation

## 2022-02-03 LAB — COMPREHENSIVE METABOLIC PANEL
ALT: 19 U/L (ref 0–44)
AST: 17 U/L (ref 15–41)
Albumin: 3.9 g/dL (ref 3.5–5.0)
Alkaline Phosphatase: 65 U/L (ref 38–126)
Anion gap: 8 (ref 5–15)
BUN: 14 mg/dL (ref 6–20)
CO2: 26 mmol/L (ref 22–32)
Calcium: 9.5 mg/dL (ref 8.9–10.3)
Chloride: 104 mmol/L (ref 98–111)
Creatinine, Ser: 0.71 mg/dL (ref 0.44–1.00)
GFR, Estimated: >60 mL/min (ref >60)
Glucose, Bld: 129 mg/dL — ABNORMAL HIGH (ref 70–99)
Potassium: 4.3 mmol/L (ref 3.5–5.1)
Sodium: 138 mmol/L (ref 135–145)
Total Bilirubin: 0.6 mg/dL (ref 0.3–1.2)
Total Protein: 7.1 g/dL (ref 6.5–8.1)

## 2022-02-03 LAB — CBC WITH DIFFERENTIAL/PLATELET
Abs Immature Granulocytes: 0.04 10*3/uL (ref 0.00–0.07)
Basophils Absolute: 0 10*3/uL (ref 0.0–0.1)
Basophils Relative: 0 %
Eosinophils Absolute: 0.1 10*3/uL (ref 0.0–0.5)
Eosinophils Relative: 1 %
HCT: 38.2 % (ref 36.0–46.0)
Hemoglobin: 12.4 g/dL (ref 12.0–15.0)
Immature Granulocytes: 1 %
Lymphocytes Relative: 19 %
Lymphs Abs: 1.6 10*3/uL (ref 0.7–4.0)
MCH: 28.5 pg (ref 26.0–34.0)
MCHC: 32.5 g/dL (ref 30.0–36.0)
MCV: 87.8 fL (ref 80.0–100.0)
Monocytes Absolute: 0.5 10*3/uL (ref 0.1–1.0)
Monocytes Relative: 6 %
Neutro Abs: 6.3 10*3/uL (ref 1.7–7.7)
Neutrophils Relative %: 73 %
Platelets: 302 10*3/uL (ref 150–400)
RBC: 4.35 MIL/uL (ref 3.87–5.11)
RDW: 13.5 % (ref 11.5–15.5)
WBC: 8.6 10*3/uL (ref 4.0–10.5)
nRBC: 0 % (ref 0.0–0.2)

## 2022-02-03 LAB — URINALYSIS, ROUTINE W REFLEX MICROSCOPIC
Bilirubin Urine: NEGATIVE
Glucose, UA: NEGATIVE mg/dL
Nitrite: NEGATIVE
Protein, ur: 100 mg/dL — AB
Specific Gravity, Urine: >1.03 — ABNORMAL HIGH (ref 1.005–1.030)
pH: 6 (ref 5.0–8.0)

## 2022-02-03 LAB — URINALYSIS, MICROSCOPIC (REFLEX)
Bacteria, UA: NONE SEEN
RBC / HPF: >50 RBC/hpf (ref 0–5)
WBC, UA: >50 WBC/hpf (ref 0–5)

## 2022-02-03 LAB — POC URINE PREG, ED: Preg Test, Ur: NEGATIVE

## 2022-02-03 MED ORDER — SODIUM CHLORIDE 0.9 % IV SOLN
1.0000 g | Freq: Once | INTRAVENOUS | Status: AC
  Administered 2022-02-03: 1 g via INTRAVENOUS
  Filled 2022-02-03: qty 10

## 2022-02-03 MED ORDER — CEPHALEXIN 500 MG PO CAPS: 500.0000 mg | ORAL_CAPSULE | Freq: Four times a day (QID) | ORAL | 0 refills | Status: AC

## 2022-02-03 MED ORDER — CEFTRIAXONE SODIUM 1 G IJ SOLR: 1.0000 g | Freq: Once | INTRAMUSCULAR | Status: DC

## 2022-02-03 NOTE — ED Triage Notes (Signed)
Pt reports burning sensation with urination, feeling like she can't empty her bladder. Pt denies any other symptom

## 2022-02-03 NOTE — ED Notes (Signed)
Pt reports frequency of urination, only urinating small amounts at a time, cramping in pelvic area and a sense of fullness; denies fever. Pt in NAD.

## 2022-02-03 NOTE — ED Provider Notes (Signed)
[facility] Provider Note  Patient Contact: 11:04 PM (approximate)   History   Urinary Frequency   HPI  Angela Mccormick is a 36 y.o. female presents to the emergency department with dysuria and increased urinary frequency.  Patient denies fever, chills or flank pain.  She has had some suprapubic cramping.  She states that she has had her last urinary tract infection in the spring of this year.  No vomiting.  Patient has no concerns for STDs.      Physical Exam   Triage Vital Signs: ED Triage Vitals  Enc Vitals Group     BP 02/03/22 2044 (!) 122/96     Pulse Rate 02/03/22 2044 91     Resp 02/03/22 2044 16     Temp 02/03/22 2044 (!) 97.5 F (36.4 C)     Temp Source 02/03/22 2044 Oral     SpO2 02/03/22 2044 99 %     Weight 02/03/22 2045 206 lb (93.4 kg)     Height 02/03/22 2045 5\' 3"  (1.6 m)     Head Circumference --      Peak Flow --      Pain Score 02/03/22 2049 5     Pain Loc --      Pain Edu? --      Excl. in [place]? --     Most recent vital signs: Vitals:   02/03/22 2223 02/03/22 2230  BP: 119/64 114/68  Pulse: 70 67  Resp:  18  Temp:    SpO2:  98%     General: Alert and in no acute distress. Eyes:  PERRL. EOMI. Head: No acute traumatic findings ENT:      Nose: No congestion/rhinnorhea.      Mouth/Throat: Mucous membranes are moist. Neck: No stridor. No cervical spine tenderness to palpation. Cardiovascular:  Good peripheral perfusion Respiratory: Normal respiratory effort without tachypnea or retractions. Lungs CTAB. Good air entry to the bases with no decreased or absent breath sounds. Gastrointestinal: Bowel sounds 4 quadrants. Soft and nontender to palpation. No guarding or rigidity. No palpable masses. No distention. No CVA tenderness. Musculoskeletal: Full range of motion to all extremities.  Neurologic:  No gross focal neurologic deficits are appreciated.  Skin:   No rash noted Other:   ED Results / Procedures /  Treatments   Labs (all labs ordered are listed, but only abnormal results are displayed) Labs Reviewed  URINALYSIS, ROUTINE W REFLEX MICROSCOPIC - Abnormal; Notable for the following components:      Result Value   Specific Gravity, Urine >1.030 (*)    Hgb urine dipstick LARGE (*)    Ketones, ur TRACE (*)    Protein, ur 100 (*)    Leukocytes,Ua SMALL (*)    All other components within normal limits  COMPREHENSIVE METABOLIC PANEL - Abnormal; Notable for the following components:   Glucose, Bld 129 (*)    All other components within normal limits  URINE CULTURE  URINALYSIS, MICROSCOPIC (REFLEX)  CBC WITH DIFFERENTIAL/PLATELET  POC URINE PREG, ED       PROCEDURES:  Critical Care performed: No  Procedures   MEDICATIONS ORDERED IN ED: Medications  cefTRIAXone (ROCEPHIN) 1 g in sodium chloride 0.9 % 100 mL IVPB (1 g Intravenous New Bag/Given 02/03/22 2233)     IMPRESSION / MDM / ASSESSMENT AND PLAN / ED COURSE  I reviewed the triage vital signs and the nursing notes.  Assessment and plan UTI 36 year old female presents to the emergency department with dysuria and increased urinary frequency.  Vital signs are reassuring at triage.  On exam, patient was alert, active and nontoxic-appearing.   CBC, CMP and urine pregnancy test reassuring.  Urinalysis concerning for UTI with large amount of white blood cells and hemoglobin.  Urine culture in process  Patient was given Rocephin in the emergency department and discharged with Keflex.   FINAL CLINICAL IMPRESSION(S) / ED DIAGNOSES   Final diagnoses:  Acute cystitis without hematuria     Rx / DC Orders   ED Discharge Orders          Ordered    cephALEXin (KEFLEX) 500 MG capsule  4 times daily        02/03/22 2259             Note:  This document was prepared using Dragon voice recognition software and may include unintentional dictation errors.   Vallarie Mare [place], PA-C 02/03/22  2308    Nena Polio, MD 02/04/22 [phone]

## 2022-02-03 NOTE — Discharge Instructions (Addendum)
Take Keflex four times daily for seven days.  °

## 2022-02-05 LAB — URINE CULTURE: Culture: <10000 — AB

## 2022-02-08 ENCOUNTER — Encounter: Payer: Self-pay | Admitting: Family Medicine

## 2022-02-08 ENCOUNTER — Ambulatory Visit (INDEPENDENT_AMBULATORY_CARE_PROVIDER_SITE_OTHER): Payer: BC Managed Care – PPO | Admitting: Family Medicine

## 2022-02-08 VITALS — BP 110/70 | HR 73 | Temp 98.4°F | Ht 63.0 in | Wt 206.6 lb

## 2022-02-08 DIAGNOSIS — Z809 Family history of malignant neoplasm, unspecified: Secondary | ICD-10-CM

## 2022-02-08 DIAGNOSIS — F419 Anxiety disorder, unspecified: Secondary | ICD-10-CM | POA: Diagnosis not present

## 2022-02-08 DIAGNOSIS — F32A Anxiety disorder, unspecified: Secondary | ICD-10-CM

## 2022-02-08 DIAGNOSIS — R350 Frequency of micturition: Secondary | ICD-10-CM | POA: Diagnosis not present

## 2022-02-08 LAB — URINALYSIS, ROUTINE W REFLEX MICROSCOPIC
Bilirubin Urine: NEGATIVE
Hgb urine dipstick: NEGATIVE
Ketones, ur: NEGATIVE
Leukocytes,Ua: NEGATIVE
Nitrite: NEGATIVE
Specific Gravity, Urine: 1.02 (ref 1.000–1.030)
Total Protein, Urine: NEGATIVE
Urine Glucose: NEGATIVE
Urobilinogen, UA: 1 (ref 0.0–1.0)
pH: 7 (ref 5.0–8.0)

## 2022-02-08 MED ORDER — BUPROPION HCL ER (XL) 300 MG PO TB24: 300.0000 mg | ORAL_TABLET | Freq: Every day | ORAL | 1 refills | Status: DC

## 2022-02-08 NOTE — Assessment & Plan Note (Signed)
Patient continues to have issues with depression.  Discussed increasing Wellbutrin to 300 mg once daily.  Discussed the potential for grief counseling available through hospice versus doing therapy on her own.  She opts to increase the Wellbutrin at this time and monitor.

## 2022-02-08 NOTE — Assessment & Plan Note (Signed)
Concerning for UTI given her reported symptoms though her urine culture was not revealing of this.  Her symptoms have improved some with the antibiotic though I would expect more improvement at this time given that she only has 2 to 3 days of treatment left.  We will check her urine today.  We will contact her with the results.  She will complete her course of Keflex at this time.  If she has any worsening symptoms she will let us know.

## 2022-02-08 NOTE — Progress Notes (Signed)
Tommi Rumps, MD Phone: [phone]  Angela Mccormick is a 36 y.o. female who presents today for hospital follow-up.  UTI: Patient went to the hospital for dysuria and urinary frequency.  She was diagnosed with UTI based off of her urinalysis and treated with Rocephin and then sent home with Keflex.  Her urine culture did not grow any significant bacteria though she reports she supplied a very small amount of urine.  She notes no dysuria at this time though she does still have some frequency and urgency.  She is still having some suprapubic cramps as well.  She had a negative urine pregnancy test in the hospital.  She has about 3 days left of her Keflex.  Depression: Patient notes she continues to have some depression.  She lost her father 2 months ago and that is been difficult.  She does have good support at home.  No SI or HI.  She is on Wellbutrin 150 mg daily.  Family history of cancer: She reports her maternal aunt had brain cancer, maternal grandmother had colon cancer, father had brain cancer, and paternal grandmother had bone cancer.  Social History   Tobacco Use  Smoking Status Never  Smokeless Tobacco Never    Current Outpatient Medications on File Prior to Visit  Medication Sig Dispense Refill   albuterol (VENTOLIN HFA) 108 (90 Base) MCG/ACT inhaler Inhale 2 puffs into the lungs every 6 (six) hours as needed for wheezing or shortness of breath. 8 g 0   Multiple Vitamins-Minerals (EQ MULTIVITAMINS ADULT GUMMY PO) Take one gummy by mouth daily.     nystatin (MYCOSTATIN/NYSTOP) powder Apply 1 application topically 3 (three) times daily. 15 g 0   testosterone cypionate (DEPOTESTOSTERONE CYPIONATE) 200 MG/ML injection Inject into the muscle.     cephALEXin (KEFLEX) 500 MG capsule Take 1 capsule (500 mg total) by mouth 4 (four) times daily for 7 days. (Patient not taking: Reported on 02/08/2022) 28 capsule 0   No current facility-administered medications on file prior to visit.      ROS see history of present illness  Objective  Physical Exam Vitals:   02/08/22 0920  BP: 110/70  Pulse: 73  Temp: 98.4 F (36.9 C)  SpO2: 99%    BP Readings from Last 3 Encounters:  02/08/22 110/70  02/03/22 114/68  01/02/22 108/64   Wt Readings from Last 3 Encounters:  02/08/22 206 lb 9.6 oz (93.7 kg)  02/03/22 206 lb (93.4 kg)  01/02/22 207 lb 6.4 oz (94.1 kg)    Physical Exam Constitutional:      General: She is not in acute distress.    Appearance: She is not diaphoretic.  Pulmonary:     Effort: Pulmonary effort is normal.  Abdominal:     General: Bowel sounds are normal. There is no distension.     Palpations: Abdomen is soft.     Tenderness: There is abdominal tenderness (Slight suprapubic tenderness).  Skin:    General: Skin is warm and dry.  Neurological:     Mental Status: She is alert.      Assessment/Plan: Please see individual problem list.  Problem List Items Addressed This Visit     Anxiety and depression (Chronic)    Patient continues to have issues with depression.  Discussed increasing Wellbutrin to 300 mg once daily.  Discussed the potential for grief counseling available through hospice versus doing therapy on her own.  She opts to increase the Wellbutrin at this time and monitor.  Relevant Medications   buPROPion (WELLBUTRIN XL) 300 MG 24 hr tablet   Family history of cancer (Chronic)    Refer for genetic counseling to see if she meets criteria for genetic screening.      Relevant Orders   Ambulatory referral to Genetics   Urine frequency - Primary    Concerning for UTI given her reported symptoms though her urine culture was not revealing of this.  Her symptoms have improved some with the antibiotic though I would expect more improvement at this time given that she only has 2 to 3 days of treatment left.  We will check her urine today.  We will contact her with the results.  She will complete her course of Keflex at this  time.  If she has any worsening symptoms she will let us know.      Relevant Orders   Urinalysis, Routine w reflex microscopic   Urine Culture     Return in about 6 weeks (around 03/22/2022) for Depression follow-up.   [name], MD [facility]

## 2022-02-08 NOTE — Patient Instructions (Signed)
Nice to see you. We will increase her Wellbutrin.  If you notice any potential side effects please let us know.  We will see you back in 6 weeks. We will contact you with your urine results.  Please finish the Keflex.  If you have any worsening urine symptoms please be reevaluated. Somebody from genetics should contact you to set up genetic counseling.

## 2022-02-08 NOTE — Assessment & Plan Note (Signed)
Refer for genetic counseling to see if she meets criteria for genetic screening.

## 2022-02-10 LAB — URINE CULTURE
MICRO NUMBER:: 13975751
Result:: NO GROWTH
SPECIMEN QUALITY:: ADEQUATE

## 2022-02-22 ENCOUNTER — Inpatient Hospital Stay: Payer: BC Managed Care – PPO | Attending: Internal Medicine | Admitting: Licensed Clinical Social Worker

## 2022-02-22 ENCOUNTER — Inpatient Hospital Stay: Payer: BC Managed Care – PPO

## 2022-02-22 ENCOUNTER — Encounter: Payer: Self-pay | Admitting: Licensed Clinical Social Worker

## 2022-02-22 DIAGNOSIS — Z8042 Family history of malignant neoplasm of prostate: Secondary | ICD-10-CM

## 2022-02-22 DIAGNOSIS — Z808 Family history of malignant neoplasm of other organs or systems: Secondary | ICD-10-CM

## 2022-02-22 DIAGNOSIS — Z800 Family history of malignant neoplasm of digestive organs: Secondary | ICD-10-CM

## 2022-02-22 DIAGNOSIS — Z803 Family history of malignant neoplasm of breast: Secondary | ICD-10-CM | POA: Diagnosis not present

## 2022-02-22 NOTE — Progress Notes (Signed)
REFERRING PROVIDER: Glori Luis, MD [address] [place],  [place] [address]  PRIMARY PROVIDER:  Glori Luis, MD  PRIMARY REASON FOR VISIT:  1. Family history of brain cancer   2. Family history of colon cancer   3. Family history of breast cancer   4. Family history of prostate cancer      HISTORY OF PRESENT ILLNESS:   Angela Mccormick, a 36 y.o. female, was seen for a [place] cancer genetics consultation at the request of Dr. Birdie Mccormick due to a family history of cancer.  Angela Mccormick presents to clinic today to discuss the possibility of a hereditary predisposition to cancer, genetic testing, and to further clarify her future cancer risks, as well as potential cancer risks for family members.   Angela Mccormick is a 36 y.o. female with no personal history of cancer.    RISK FACTORS:  Menarche was at age 5-12.  First live birth at age 47.  OCP use for approximately 5 years.  Ovaries intact: yes.  Hysterectomy: no.  Menopausal status: premenopausal.  HRT use: 0 years. Colonoscopy: no; not examined. Mammogram within the last year: no. Number of breast biopsies: 0. Up to date with pelvic exams: yes.  Past Medical History:  Diagnosis Date   Asthma    Depression    Gestational diabetes    History of Chiari malformation    History of syncope    Migraine     Past Surgical History:  Procedure Laterality Date   CESAREAN SECTION N/A 12/17/2017   Procedure: CESAREAN SECTION;  Surgeon: Vena Austria, MD;  Location: [facility] ORS;  Service: Obstetrics;  Laterality: N/A;   CESAREAN SECTION WITH BILATERAL TUBAL LIGATION  04/06/2019   Procedure: CESAREAN SECTION WITH BILATERAL TUBAL LIGATION;  Surgeon: Vena Austria, MD;  Location: [facility] ORS;  Service: Obstetrics;;   FLEXIBLE SIGMOIDOSCOPY N/A 03/12/2018   Procedure: FLEXIBLE SIGMOIDOSCOPY;  Surgeon: Toney Reil, MD;  Location: [facility] ENDOSCOPY;  Service: Gastroenterology;  Laterality: N/A;   NO PAST  SURGERIES     WISDOM TOOTH EXTRACTION      FAMILY HISTORY:  We obtained a detailed, 4-generation family history.  Significant diagnoses are listed below: Family History  Problem Relation Age of Onset   Diabetes Mother    Stroke Mother    Cancer Mother        possible colon cancer   Diabetes Father    Brain cancer Father 11   Stroke Maternal Aunt    Brain cancer Maternal Aunt        x2; d. 35s   Colon cancer Maternal Grandmother        dx 30s   Stroke Maternal Grandfather    Heart disease Maternal Grandfather        CABG   Prostate cancer Maternal Grandfather        22s   Cancer Paternal Grandmother        bone cancer, d. 31s   Breast cancer Other    Breast cancer Paternal Great-grandmother        both PGGMs   Chiari malformation Son    Angela Mccormick has 1 son, 4 and 1 daughter, 2. She has 1 brother, 16.   Angela Mccormick's mother possibly had colon cancer, she is living at 83. Maternal aunt had brain cancer twice and died in her 42s, patient unsure exact type of brain cancer. Maternal grandmother had colon cancer in her 40s, grandfather had prostate cancer in his 63s.  [name]'s father had brain cancer at 20 and died at 19. Paternal grandmother had bone cancer, patient unsure if this is where cancer started, she passed in her 66s. Her mother, patient's great grandmother, had breast cancer. Paternal grandfather is living in his 62s, his sister and mother both had breast cancer.  Angela Mccormick is unaware of previous family history of genetic testing for hereditary cancer risks. There is no reported Ashkenazi Jewish ancestry. There is no known consanguinity.    GENETIC COUNSELING ASSESSMENT: Angela Mccormick is a 36 y.o. female with a family history of brain and colon cancer which is somewhat suggestive of a hereditary cancer syndrome and predisposition to cancer. We, therefore, discussed and recommended the following at today's visit.   DISCUSSION: We discussed that approximately 10% of  cancer is hereditary. Most cases of hereditary colon cancer are associated with Lynch syndrome genes, which can also increase risk for brain cancer. There are other genes associated with hereditary cancer as well. Cancers and risks are gene specific. We discussed that testing is beneficial for several reasons including knowing about cancer risks, identifying potential screening and risk-reduction options that may be appropriate, and to understand if other family members could be at risk for cancer and allow them to undergo genetic testing.   We reviewed the characteristics, features and inheritance patterns of hereditary cancer syndromes. We also discussed genetic testing, including the appropriate family members to test, the process of testing, insurance coverage and turn-around-time for results. We discussed the implications of a negative, positive and/or variant of uncertain significant result. We recommended Angela Mccormick pursue genetic testing for the [employer] CancerNext-Expanded+RNA gene panel.   Based on Angela Mccormick's family history of colon cancer and brain cancer in her aunt's 21s, she meets medical criteria for genetic testing. Despite that she meets criteria, she may still have an out of pocket cost. We discussed that if her out of pocket cost for testing is over $100, the laboratory will call and confirm whether she wants to proceed with testing.  If the out of pocket cost of testing is less than $100 she will be billed by the genetic testing laboratory.   PLAN: After considering the risks, benefits, and limitations, Angela Mccormick provided informed consent to pursue genetic testing and the blood sample was sent to [facility] for analysis of the CancerNext-Expanded+RNA panel. Results should be available within approximately 2-3 weeks' time, at which point they will be disclosed by telephone to Angela Mccormick, as will any additional recommendations warranted by these results. Angela Mccormick will receive a  summary of her genetic counseling visit and a copy of her results once available. This information will also be available in Epic.   Angela Mccormick's questions were answered to her satisfaction today. Our contact information was provided should additional questions or concerns arise. Thank you for the referral and allowing [place] to share in the care of your patient.   Faith Rogue, MS, [facility] Genetic Counselor [email or web address] Phone: [phone]  The patient was seen for a total of 25 minutes in face-to-face genetic counseling.  Dr. Grayland Ormond was available for discussion regarding this case.   _______________________________________________________________________ For Office Staff:  Number of people involved in session: 1 Was an Intern/ student involved with case: no

## 2022-03-06 ENCOUNTER — Telehealth: Payer: Self-pay | Admitting: Licensed Clinical Social Worker

## 2022-03-06 NOTE — Telephone Encounter (Signed)
I contacted Ms. Holderman to discuss her genetic testing results. No pathogenic variants were identified in the 77 genes analyzed. Detailed clinic note to follow.   The test report has been scanned into EPIC and is located under the Molecular Pathology section of the Results Review tab.  A portion of the result report is included below for reference.      Faith Rogue, MS, [facility] Genetic Counselor [email or web address] Phone: [phone]

## 2022-03-07 ENCOUNTER — Ambulatory Visit: Payer: Self-pay | Admitting: Licensed Clinical Social Worker

## 2022-03-07 ENCOUNTER — Encounter: Payer: Self-pay | Admitting: Licensed Clinical Social Worker

## 2022-03-07 DIAGNOSIS — Z1379 Encounter for other screening for genetic and chromosomal anomalies: Secondary | ICD-10-CM | POA: Insufficient documentation

## 2022-03-07 NOTE — Progress Notes (Signed)
HPI:   Angela Mccormick was previously seen in the [place] clinic due to a family history of cancer and concerns regarding a hereditary predisposition to cancer. Please refer to our prior cancer genetics clinic note for more information regarding our discussion, assessment and recommendations, at the time. Angela Mccormick's recent genetic test results were disclosed to her, as were recommendations warranted by these results. These results and recommendations are discussed in more detail below.  CANCER HISTORY:  Oncology History   No history exists.    FAMILY HISTORY:  We obtained a detailed, 4-generation family history.  Significant diagnoses are listed below: Family History  Problem Relation Age of Onset   Diabetes Mother    Stroke Mother    Cancer Mother        possible colon cancer   Diabetes Father    Brain cancer Father 31   Stroke Maternal Aunt    Brain cancer Maternal Aunt        x2; d. 20s   Colon cancer Maternal Grandmother        dx 53s   Stroke Maternal Grandfather    Heart disease Maternal Grandfather        CABG   Prostate cancer Maternal Grandfather        48s   Cancer Paternal Grandmother        bone cancer, d. 92s   Breast cancer Other    Breast cancer Paternal Great-grandmother        both PGGMs   Chiari malformation Son     Angela Mccormick has 1 son, 4 and 1 daughter, 2. She has 1 brother, 1.    Angela Mccormick's mother possibly had colon cancer, she is living at 79. Maternal aunt had brain cancer twice and died in her 25s, patient unsure exact type of brain cancer. Maternal grandmother had colon cancer in her 77s, grandfather had prostate cancer in his 26s.   Angela Mccormick father had brain cancer at 58 and died at 61. Paternal grandmother had bone cancer, patient unsure if this is where cancer started, she passed in her 54s. Her mother, patient's great grandmother, had breast cancer. Paternal grandfather is living in his 5s, his sister and mother both had  breast cancer.   Angela Mccormick is unaware of previous family history of genetic testing for hereditary cancer risks. There is no reported Ashkenazi Jewish ancestry. There is no known consanguinity.     GENETIC TEST RESULTS:  The [employer] CancerNext-Expanded+RNA Panel found no pathogenic mutations.   The CancerNext-Expanded + RNAinsight gene panel offered by [employer] and includes sequencing and rearrangement analysis for the following 77 genes: IP, ALK, APC*, ATM*, AXIN2, BAP1, BARD1, BLM, BMPR1A, BRCA1*, BRCA2*, BRIP1*, CDC73, CDH1*,CDK4, CDKN1B, CDKN2A, CHEK2*, CTNNA1, DICER1, FANCC, FH, FLCN, GALNT12, KIF1B, LZTR1, MAX, MEN1, MET, MLH1*, MSH2*, MSH3, MSH6*, MUTYH*, NBN, NF1*, NF2, NTHL1, PALB2*, PHOX2B, PMS2*, POT1, PRKAR1A, PTCH1, PTEN*, RAD51C*, RAD51D*,RB1, RECQL, RET, SDHA, SDHAF2, SDHB, SDHC, SDHD, SMAD4, SMARCA4, SMARCB1, SMARCE1, STK11, SUFU, TMEM127, TP53*,TSC1, TSC2, VHL and XRCC2 (sequencing and deletion/duplication); EGFR, EGLN1, HOXB13, KIT, MITF, PDGFRA, POLD1 and POLE (sequencing only); EPCAM and GREM1 (deletion/duplication only).   The test report has been scanned into EPIC and is located under the Molecular Pathology section of the Results Review tab.  A portion of the result report is included below for reference. Genetic testing reported out on 03/03/2022.      Even though a pathogenic variant was not identified, possible explanations for the cancer in the  family may include: There may be no hereditary risk for cancer in the family. The cancers in her family may be sporadic/familial or due to other genetic and environmental factors. There may be a gene mutation in one of these genes that current testing methods cannot detect but that chance is small. There could be another gene that has not yet been discovered, or that we have not yet tested, that is responsible for the cancer diagnoses in the family.  It is also possible there is a hereditary cause for the cancer in the  family that Ms. Lozano did not inherit.  Therefore, it is important to remain in touch with cancer genetics in the future so that we can continue to offer Angela Mccormick the most up to date genetic testing.   ADDITIONAL GENETIC TESTING:  We discussed with Angela Mccormick that her genetic testing was fairly extensive.  If there are additional relevant genes identified to increase cancer risk that can be analyzed in the future, we would be happy to discuss and coordinate this testing at that time.    CANCER SCREENING RECOMMENDATIONS:  Angela Mccormick test result is considered negative (normal).  This means that we have not identified a hereditary cause for her family history of cancer at this time.   An individual's cancer risk and medical management are not determined by genetic test results alone. Overall cancer risk assessment incorporates additional factors, including personal medical history, family history, and any available genetic information that may result in a personalized plan for cancer prevention and surveillance. Therefore, it is recommended she continue to follow the cancer management and screening guidelines provided by her  primary healthcare provider.  RECOMMENDATIONS FOR FAMILY MEMBERS:   Since she did not inherit a identifiable mutation in a cancer predisposition gene included on this panel, her children could not have inherited a known mutation from her in one of these genes. Individuals in this family might be at some increased risk of developing cancer, over the general population risk, due to the family history of cancer.  Individuals in the family should notify their providers of the family history of cancer. We recommend women in this family have a yearly mammogram beginning at age 34, or 60 years younger than the earliest onset of cancer, an annual clinical breast exam, and perform monthly breast self-exams.  Family members should have colonoscopies by at age 76, or earlier, as  recommended by their providers.   FOLLOW-UP:  Lastly, we discussed with Angela Mccormick that cancer genetics is a rapidly advancing field and it is possible that new genetic tests will be appropriate for her and/or her family members in the future. We encouraged her to remain in contact with cancer genetics on an annual basis so we can update her personal and family histories and let her know of advances in cancer genetics that may benefit this family.   Our contact number was provided. Angela Mccormick's questions were answered to her satisfaction, and she knows she is welcome to call us at anytime with additional questions or concerns.    Angela Rogue, MS, [facility] Genetic Counselor [email or web address] Phone: [phone]

## 2022-03-10 DIAGNOSIS — G43009 Migraine without aura, not intractable, without status migrainosus: Secondary | ICD-10-CM | POA: Diagnosis not present

## 2022-03-10 DIAGNOSIS — F5222 Female sexual arousal disorder: Secondary | ICD-10-CM | POA: Diagnosis not present

## 2022-03-27 ENCOUNTER — Encounter: Payer: Self-pay | Admitting: Family Medicine

## 2022-03-27 ENCOUNTER — Ambulatory Visit (INDEPENDENT_AMBULATORY_CARE_PROVIDER_SITE_OTHER): Payer: BC Managed Care – PPO | Admitting: Family Medicine

## 2022-03-27 VITALS — BP 125/70 | HR 77 | Temp 98.6°F | Ht 63.0 in | Wt 208.0 lb

## 2022-03-27 DIAGNOSIS — F32A Depression, unspecified: Secondary | ICD-10-CM

## 2022-03-27 DIAGNOSIS — F419 Anxiety disorder, unspecified: Secondary | ICD-10-CM

## 2022-03-27 NOTE — Assessment & Plan Note (Signed)
Still has some depression though it is improved.  She will continue Wellbutrin 300 mg daily.  Discussed the potential for doing therapy or grief counseling through hospice though she defers any further interventions for this issue at this time.  She will monitor for any worsening symptoms.

## 2022-03-27 NOTE — Progress Notes (Signed)
  [name], MD Phone: [phone]  Angela Mccormick is a 36 y.o. female who presents today for f/u.  Depression: Patient notes she still has some depression though it is improved.  She notes no side effects with the Wellbutrin.  No SI.  She notes she also lost her grandmother recently in addition to the other family member she lost previously.  Denies anxiety.  Social History   Tobacco Use  Smoking Status Never  Smokeless Tobacco Never    Current Outpatient Medications on File Prior to Visit  Medication Sig Dispense Refill   albuterol (VENTOLIN HFA) 108 (90 Base) MCG/ACT inhaler Inhale 2 puffs into the lungs every 6 (six) hours as needed for wheezing or shortness of breath. 8 g 0   buPROPion (WELLBUTRIN XL) 300 MG 24 hr tablet Take 1 tablet (300 mg total) by mouth daily after lunch. 90 tablet 1   Multiple Vitamins-Minerals (EQ MULTIVITAMINS ADULT GUMMY PO) Take one gummy by mouth daily.     nystatin (MYCOSTATIN/NYSTOP) powder Apply 1 application topically 3 (three) times daily. 15 g 0   testosterone cypionate (DEPOTESTOSTERONE CYPIONATE) 200 MG/ML injection Inject into the muscle.     No current facility-administered medications on file prior to visit.     ROS see history of present illness  Objective  Physical Exam Vitals:   03/27/22 1454  BP: 125/70  Pulse: 77  Temp: 98.6 F (37 C)  SpO2: 99%    BP Readings from Last 3 Encounters:  03/27/22 125/70  02/08/22 110/70  02/03/22 114/68   Wt Readings from Last 3 Encounters:  03/27/22 208 lb (94.3 kg)  02/08/22 206 lb 9.6 oz (93.7 kg)  02/03/22 206 lb (93.4 kg)    Physical Exam Constitutional:      General: She is not in acute distress.    Appearance: She is not diaphoretic.  Cardiovascular:     Rate and Rhythm: Normal rate and regular rhythm.     Heart sounds: Normal heart sounds.  Pulmonary:     Effort: Pulmonary effort is normal.     Breath sounds: Normal breath sounds.  Skin:    General: Skin is  warm and dry.  Neurological:     Mental Status: She is alert.      Assessment/Plan: Please see individual problem list.  Problem List Items Addressed This Visit     Anxiety and depression - Primary (Chronic)    Still has some depression though it is improved.  She will continue Wellbutrin 300 mg daily.  Discussed the potential for doing therapy or grief counseling through hospice though she defers any further interventions for this issue at this time.  She will monitor for any worsening symptoms.        Return in about 1 week (around 04/03/2022).   [name], MD [facility]

## 2022-04-14 DIAGNOSIS — M25511 Pain in right shoulder: Secondary | ICD-10-CM | POA: Diagnosis not present

## 2022-04-14 DIAGNOSIS — F5222 Female sexual arousal disorder: Secondary | ICD-10-CM | POA: Diagnosis not present

## 2022-04-14 DIAGNOSIS — G8929 Other chronic pain: Secondary | ICD-10-CM | POA: Diagnosis not present

## 2022-04-14 DIAGNOSIS — M7521 Bicipital tendinitis, right shoulder: Secondary | ICD-10-CM | POA: Diagnosis not present

## 2022-05-02 ENCOUNTER — Other Ambulatory Visit: Payer: Self-pay | Admitting: Family Medicine

## 2022-05-10 ENCOUNTER — Encounter: Payer: BC Managed Care – PPO | Admitting: Family Medicine

## 2022-05-12 DIAGNOSIS — F5222 Female sexual arousal disorder: Secondary | ICD-10-CM | POA: Diagnosis not present

## 2022-05-18 ENCOUNTER — Encounter: Payer: Self-pay | Admitting: Family Medicine

## 2022-05-18 ENCOUNTER — Ambulatory Visit (INDEPENDENT_AMBULATORY_CARE_PROVIDER_SITE_OTHER): Payer: BC Managed Care – PPO | Admitting: Family Medicine

## 2022-05-18 VITALS — BP 115/70 | HR 82 | Temp 97.9°F | Ht 63.0 in | Wt 208.0 lb

## 2022-05-18 DIAGNOSIS — E669 Obesity, unspecified: Secondary | ICD-10-CM

## 2022-05-18 DIAGNOSIS — Z0000 Encounter for general adult medical examination without abnormal findings: Secondary | ICD-10-CM | POA: Diagnosis not present

## 2022-05-18 DIAGNOSIS — Z111 Encounter for screening for respiratory tuberculosis: Secondary | ICD-10-CM

## 2022-05-18 DIAGNOSIS — Z789 Other specified health status: Secondary | ICD-10-CM

## 2022-05-18 LAB — COMPREHENSIVE METABOLIC PANEL
ALT: 16 U/L (ref 0–35)
AST: 13 U/L (ref 0–37)
Albumin: 4.2 g/dL (ref 3.5–5.2)
Alkaline Phosphatase: 62 U/L (ref 39–117)
BUN: 12 mg/dL (ref 6–23)
CO2: 29 mEq/L (ref 19–32)
Calcium: 9.3 mg/dL (ref 8.4–10.5)
Chloride: 103 mEq/L (ref 96–112)
Creatinine, Ser: 0.66 mg/dL (ref 0.40–1.20)
GFR: 114.03 mL/min (ref >60.00)
Glucose, Bld: 84 mg/dL (ref 70–99)
Potassium: 3.9 mEq/L (ref 3.5–5.1)
Sodium: 139 mEq/L (ref 135–145)
Total Bilirubin: 0.5 mg/dL (ref 0.2–1.2)
Total Protein: 6.7 g/dL (ref 6.0–8.3)

## 2022-05-18 LAB — HEMOGLOBIN A1C: Hgb A1c MFr Bld: 5.4 % (ref 4.6–6.5)

## 2022-05-18 NOTE — Assessment & Plan Note (Signed)
Physical exam completed.  Encouraged healthy diet and exercise.  We will check varicella titers given that she needs this for school and we will also check a QuantiFERON gold TB test.  Other lab work as outlined.

## 2022-05-18 NOTE — Progress Notes (Signed)
Tommi Rumps, MD Phone: [phone]  Angela Mccormick is a 37 y.o. female who presents today for CPE.  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   Migraines 09/26/2012   Nodule of neck 11/01/2016   Rectal bleeding 03/04/2018   Dizziness 09/08/2019   Asthma 12/08/2019   Routine general medical examination at a health care facility 03/05/2020   Anxiety and depression 03/05/2020   Obesity (BMI 30.0-34.9) 03/05/2020   Congestion of nasal sinus 04/19/2020   Rash 04/19/2020   Abnormal menses 07/01/2020   Hypersomnia 10/08/2020   Right knee pain 01/10/2021   Low libido 01/10/2021   Urine frequency 04/05/2021   Ear itching 09/27/2021   Family history of cancer 02/08/2022   Genetic testing 03/07/2022   Resolved Ambulatory Problems    Diagnosis Date Noted   Syncope 09/26/2012   Rash 11/01/2016   Elevated glucose 04/13/2017   Trying to get pregnant 04/13/2017   BMI 37.0-37.9, adult 04/13/2017   Supervision of high risk pregnancy, antepartum 06/19/2017   Obesity affecting pregnancy, antepartum 06/19/2017   Influenza 07/27/2017   Nausea and vomiting during pregnancy 08/13/2017   Preterm premature rupture of membranes 12/15/2017   Postpartum care following cesarean delivery 12/20/2017   Routine general medical examination at a health care facility 03/04/2018   Nonhealing surgical wound 03/04/2018   Otitis media  04/27/2018   Short interval between pregnancies affecting pregnancy, antepartum 09/11/2018   History of preterm delivery, currently pregnant 11/06/2018   History of cesarean section 11/28/2018   History of gestational diabetes 03/14/2019   Preterm premature rupture of membranes 04/06/2019   Encounter for care and examination of lactating mother 04/10/2019   Shortness of breath 09/13/2019   Past Medical History:  Diagnosis Date   Depression    Gestational diabetes    History of Chiari malformation    History of syncope    Migraine     Family History  Problem Relation Age of Onset   Diabetes Mother    Stroke Mother    Cancer Mother        possible colon cancer   Diabetes Father    Brain cancer Father 30   Stroke Maternal Aunt    Brain cancer Maternal Aunt        x2; d. 44s   Colon cancer Maternal Grandmother        dx 70s   Stroke Maternal Grandfather    Heart disease Maternal Grandfather        CABG   Prostate cancer Maternal Grandfather        56s   Cancer Paternal Grandmother        bone cancer, d. 104s   Breast cancer Other    Breast cancer Paternal Great-grandmother        both PGGMs   Chiari malformation Son     Social History   Socioeconomic History   Marital status: Married    Spouse name: Not on  file   Number of children: Not on file   Years of education: Not on file   Highest education level: Not on file  Occupational History   Not on file  Tobacco Use   Smoking status: Never   Smokeless tobacco: Never  Vaping Use   Vaping Use: Never used  Substance and Sexual Activity   Alcohol use: Not Currently    Comment: 1 drink/year   Drug use: No   Sexual activity: Yes    Partners: Male    Birth control/protection: None  Other Topics Concern   Not on file  Social History Narrative   Not on file   Social Determinants of Health   Financial Resource Strain: Not on file  Food Insecurity: Not on file  Transportation Needs: Not on file  Physical  Activity: Not on file  Stress: Not on file  Social Connections: Not on file  Intimate Partner Violence: Not on file    ROS  General:  Negative for nexplained weight loss, fever Skin: Negative for new or changing mole, sore that won't heal HEENT: Negative for trouble hearing, trouble seeing, ringing in ears, mouth sores, hoarseness, change in voice, dysphagia. CV:  Negative for chest pain, dyspnea, edema, palpitations Resp: Negative for cough, dyspnea, hemoptysis GI: Negative for nausea, vomiting, diarrhea, constipation, abdominal pain, melena, hematochezia. GU: Negative for dysuria, incontinence, urinary hesitance, hematuria, vaginal or penile discharge, polyuria, sexual difficulty, lumps in testicle or breasts MSK: Negative for muscle cramps or aches, joint pain or swelling Neuro: Negative for headaches, weakness, numbness, dizziness, passing out/fainting Psych: Negative for depression, anxiety, memory problems  Objective  Physical Exam Vitals:   05/18/22 1346  BP: 115/70  Pulse: 82  Temp: 97.9 F (36.6 C)  SpO2: 99%    BP Readings from Last 3 Encounters:  05/18/22 115/70  03/27/22 125/70  02/08/22 110/70   Wt Readings from Last 3 Encounters:  05/18/22 208 lb (94.3 kg)  03/27/22 208 lb (94.3 kg)  02/08/22 206 lb 9.6 oz (93.7 kg)    Physical Exam Constitutional:      General: She is not in acute distress.    Appearance: She is not diaphoretic.  HENT:     Head: Normocephalic and atraumatic.  Cardiovascular:     Rate and Rhythm: Normal rate and regular rhythm.     Heart sounds: Normal heart sounds.  Pulmonary:     Effort: Pulmonary effort is normal.     Breath sounds: Normal breath sounds.  Abdominal:     General: Bowel sounds are normal. There is no distension.     Palpations: Abdomen is soft.     Tenderness: There is no abdominal tenderness. There is no guarding.  Musculoskeletal:     Right lower leg: No edema.     Left lower leg: No edema.   Lymphadenopathy:     Cervical: No cervical adenopathy.  Skin:    General: Skin is warm and dry.  Neurological:     Mental Status: She is alert.  Psychiatric:        Mood and Affect: Mood normal.      Assessment/Plan:   Routine general medical examination at a health care facility Assessment & Plan: Physical exam completed.  Encouraged healthy diet and exercise.  We will check varicella titers given that she needs this for school and we will also check a QuantiFERON gold TB test.  Other lab work as outlined.   Obesity (BMI 30.0-34.9) -     Hemoglobin A1c -  Comprehensive metabolic panel  Screening-pulmonary TB -     QuantiFERON-TB Gold Plus  Varicella vaccination status unknown -     Varicella zoster antibody, IgG    Return in about 1 year (around 05/19/2023) for physical.   [name], MD [facility]

## 2022-05-20 LAB — QUANTIFERON-TB GOLD PLUS
Mitogen-NIL: >10 IU/mL
NIL: 0.04 IU/mL
QuantiFERON-TB Gold Plus: NEGATIVE
TB1-NIL: 0 IU/mL
TB2-NIL: 0 IU/mL

## 2022-05-20 LAB — VARICELLA ZOSTER ANTIBODY, IGG: Varicella IgG: 799.8 index

## 2022-06-06 DIAGNOSIS — J019 Acute sinusitis, unspecified: Secondary | ICD-10-CM | POA: Diagnosis not present

## 2022-06-06 DIAGNOSIS — Z03818 Encounter for observation for suspected exposure to other biological agents ruled out: Secondary | ICD-10-CM | POA: Diagnosis not present

## 2022-06-06 DIAGNOSIS — R051 Acute cough: Secondary | ICD-10-CM | POA: Diagnosis not present

## 2022-06-09 DIAGNOSIS — F5222 Female sexual arousal disorder: Secondary | ICD-10-CM | POA: Diagnosis not present

## 2022-07-07 DIAGNOSIS — F5222 Female sexual arousal disorder: Secondary | ICD-10-CM | POA: Diagnosis not present

## 2022-07-10 ENCOUNTER — Other Ambulatory Visit: Payer: Self-pay | Admitting: Family Medicine

## 2022-07-10 DIAGNOSIS — R21 Rash and other nonspecific skin eruption: Secondary | ICD-10-CM

## 2022-07-11 MED ORDER — NYSTATIN 100000 UNIT/GM EX POWD: 1.0000 | Freq: Three times a day (TID) | CUTANEOUS | 0 refills | Status: DC

## 2022-07-24 ENCOUNTER — Ambulatory Visit
Admission: EM | Admit: 2022-07-24 | Discharge: 2022-07-24 | Disposition: A | Discharge status: Home / Self Care | Payer: BC Managed Care – PPO | Attending: Urgent Care | Admitting: Urgent Care

## 2022-07-24 DIAGNOSIS — H60393 Other infective otitis externa, bilateral: Secondary | ICD-10-CM | POA: Diagnosis not present

## 2022-07-24 MED ORDER — CIPROFLOXACIN-DEXAMETHASONE 0.3-0.1 % OT SUSP: 4.0000 [drp] | Freq: Two times a day (BID) | OTIC | 0 refills | Status: AC

## 2022-07-24 NOTE — ED Provider Notes (Signed)
Roderic Palau    CSN: [id number] Arrival date & time: 07/24/22  0909      History   Chief Complaint Chief Complaint  Patient presents with   Ear Injury    Both ears hurt to touch - Entered by patient    HPI KEANNE FETTIG is a 37 y.o. female.   HPI  Presents to urgent care for bilateral ear pain x 2-1/2 weeks.  She endorses evaluation at ENT last year who prescribed steroid eardrop for "eczema".  She states she has been using this again and it is not effective.  Past Medical History:  Diagnosis Date   Asthma    Depression    Gestational diabetes    History of Chiari malformation    History of syncope    Migraine     Patient Active Problem List   Diagnosis Date Noted   Genetic testing 03/07/2022   Family history of cancer 02/08/2022   Ear itching 09/27/2021   Urine frequency 04/05/2021   Right knee pain 01/10/2021   Low libido 01/10/2021   Hypersomnia 10/08/2020   Abnormal menses 07/01/2020   Congestion of nasal sinus 04/19/2020   Rash 04/19/2020   Routine general medical examination at a health care facility 03/05/2020   Anxiety and depression 03/05/2020   Obesity (BMI 30.0-34.9) 03/05/2020   Asthma 12/08/2019   Dizziness 09/08/2019   Rectal bleeding 03/04/2018   Nodule of neck 11/01/2016   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   Migraines 09/26/2012    Past Surgical History:  Procedure Laterality Date   CESAREAN SECTION N/A 12/17/2017   Procedure: CESAREAN SECTION;  Surgeon: [name], MD;  Location: [facility] ORS;  Service: Obstetrics;  Laterality: N/A;   CESAREAN SECTION WITH BILATERAL TUBAL LIGATION  04/06/2019   Procedure: CESAREAN SECTION WITH BILATERAL TUBAL LIGATION;  Surgeon: [name], MD;  Location: [place] [facility];  Service: Obstetrics;;   FLEXIBLE SIGMOIDOSCOPY N/A 03/12/2018   Procedure: FLEXIBLE SIGMOIDOSCOPY;  Surgeon: Lin Landsman, MD;  Location: [facility] ENDOSCOPY;  Service: Gastroenterology;  Laterality: N/A;   NO PAST  SURGERIES     WISDOM TOOTH EXTRACTION      OB History     Gravida  2   Para  2   Term  0   Preterm  2   AB      Living  2      SAB      IAB      Ectopic      Multiple  0   Live Births  2            Home Medications    Prior to Admission medications   Medication Sig Start Date End Date Taking? Authorizing Provider  albuterol (VENTOLIN HFA) 108 (90 Base) MCG/ACT inhaler INHALE 2 PUFFS BY MOUTH EVERY 6 HOURS AS NEEDED FOR WHEEZING FOR SHORTNESS OF BREATH 05/03/22   Leone Haven, MD  buPROPion (WELLBUTRIN XL) 300 MG 24 hr tablet Take 1 tablet (300 mg total) by mouth daily after lunch. 02/08/22   Leone Haven, MD  Multiple Vitamins-Minerals (EQ MULTIVITAMINS ADULT GUMMY PO) Take one gummy by mouth daily.    [provider]  nystatin (MYCOSTATIN/NYSTOP) powder Apply 1 Application topically 3 (three) times daily. 07/11/22   Leone Haven, MD  testosterone cypionate (DEPOTESTOSTERONE CYPIONATE) 200 MG/ML injection Inject into the muscle. 12/02/20   [provider]    Family History Family History  Problem Relation Age of Onset   Diabetes Mother  Stroke Mother    Cancer Mother        possible colon cancer   Diabetes Father    Brain cancer Father 1   Stroke Maternal Aunt    Brain cancer Maternal Aunt        x2; d. 79s   Colon cancer Maternal Grandmother        dx 38s   Stroke Maternal Grandfather    Heart disease Maternal Grandfather        CABG   Prostate cancer Maternal Grandfather        55s   Cancer Paternal Grandmother        bone cancer, d. 34s   Breast cancer Other    Breast cancer Paternal Great-grandmother        both PGGMs   Chiari malformation Son     Social History Social History   Tobacco Use   Smoking status: Never   Smokeless tobacco: Never  Vaping Use   Vaping Use: Never used  Substance Use Topics   Alcohol use: Not Currently    Comment: 1 drink/year   Drug use: No     Allergies    Sumatriptan succinate and Other   Review of Systems Review of Systems   Physical Exam Triage Vital Signs ED Triage Vitals [07/24/22 0941]  Enc Vitals Group     BP      Pulse      Resp      Temp      Temp src      SpO2      Weight      Height      Head Circumference      Peak Flow      Pain Score 4     Pain Loc      Pain Edu?      Excl. in [place]?    No data found.  Updated Vital Signs LMP 07/24/2022   Visual Acuity Right Eye Distance:   Left Eye Distance:   Bilateral Distance:    Right Eye Near:   Left Eye Near:    Bilateral Near:     Physical Exam Vitals reviewed.  Constitutional:      Appearance: Normal appearance.  HENT:     Ears:     Comments: Tenderness with mobility of the helices of her ears bilaterally, also with introduction of the otoscope.  There is erythema in the mid EACs bilaterally with appearance of scaling.  No erythema is noted.  No discharge. Neurological:     Mental Status: She is alert.      UC Treatments / Results  Labs (all labs ordered are listed, but only abnormal results are displayed) Labs Reviewed - No data to display  EKG   Radiology No results found.  Procedures Procedures (including critical care time)  Medications Ordered in UC Medications - No data to display  Initial Impression / Assessment and Plan / UC Course  I have reviewed the triage vital signs and the nursing notes.  Pertinent labs & imaging results that were available during my care of the patient were reviewed by me and considered in my medical decision making (see chart for details).   Tenderness with mobility of the helices of her ears bilaterally, also with introduction of the otoscope.  There is erythema in the mid EACs bilaterally with appearance of scaling.  No erythema is noted.  No discharge.  Otitis externa, likely atopic.  Will cover the possibility of bacterial infection  secondary to the EAC "mucosa".  Asking her to follow-up with ENT if  symptoms do not resolve.   Final Clinical Impressions(s) / UC Diagnoses   Final diagnoses:  None   Discharge Instructions   None    ED Prescriptions   None    PDMP not reviewed this encounter.   Rose Phi, FNP 07/24/22 1000

## 2022-07-24 NOTE — ED Triage Notes (Signed)
Patient presents to [facility] for bilateral ear x 2.5 weeks. States she was evaluated by ENT, prescribed ear drops with no changes.

## 2022-07-24 NOTE — Discharge Instructions (Signed)
Continue with the previously prescribed eardrop following completion of the drops that I have prescribed today.  Follow-up with your ENT provider if your symptoms do not improve with treatment.

## 2022-08-03 DIAGNOSIS — F5222 Female sexual arousal disorder: Secondary | ICD-10-CM | POA: Diagnosis not present

## 2022-09-05 DIAGNOSIS — F5222 Female sexual arousal disorder: Secondary | ICD-10-CM | POA: Diagnosis not present

## 2022-10-05 DIAGNOSIS — F5222 Female sexual arousal disorder: Secondary | ICD-10-CM | POA: Diagnosis not present

## 2022-10-06 ENCOUNTER — Encounter: Payer: BC Managed Care – PPO | Admitting: Family Medicine

## 2022-10-10 DIAGNOSIS — F5222 Female sexual arousal disorder: Secondary | ICD-10-CM | POA: Diagnosis not present

## 2022-10-29 IMAGING — DX DG KNEE COMPLETE 4+V*R*
5 series · 5 of 5 positions shown · non-contrast
Comparison: None.

CLINICAL DATA: Right anterior knee pain after falling last week.

EXAM:
RIGHT KNEE - COMPLETE 4+ VIEW

[knee standing ap]
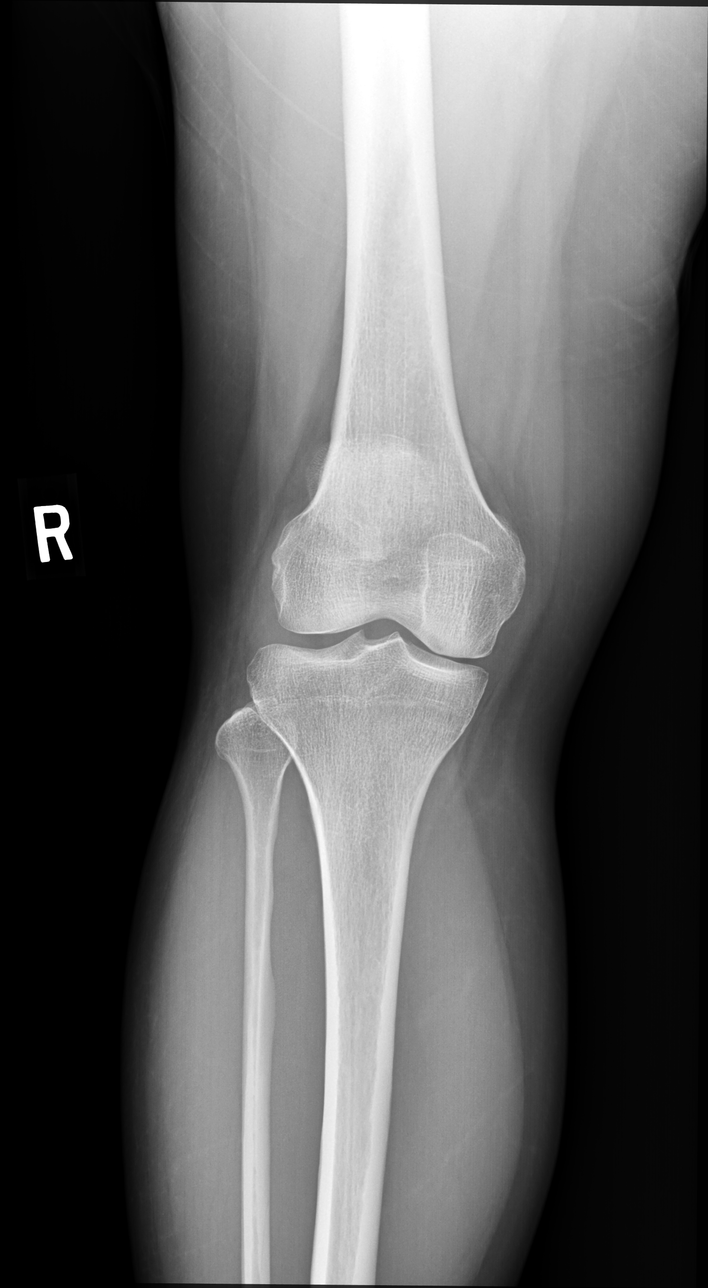

[knee standing external ap]
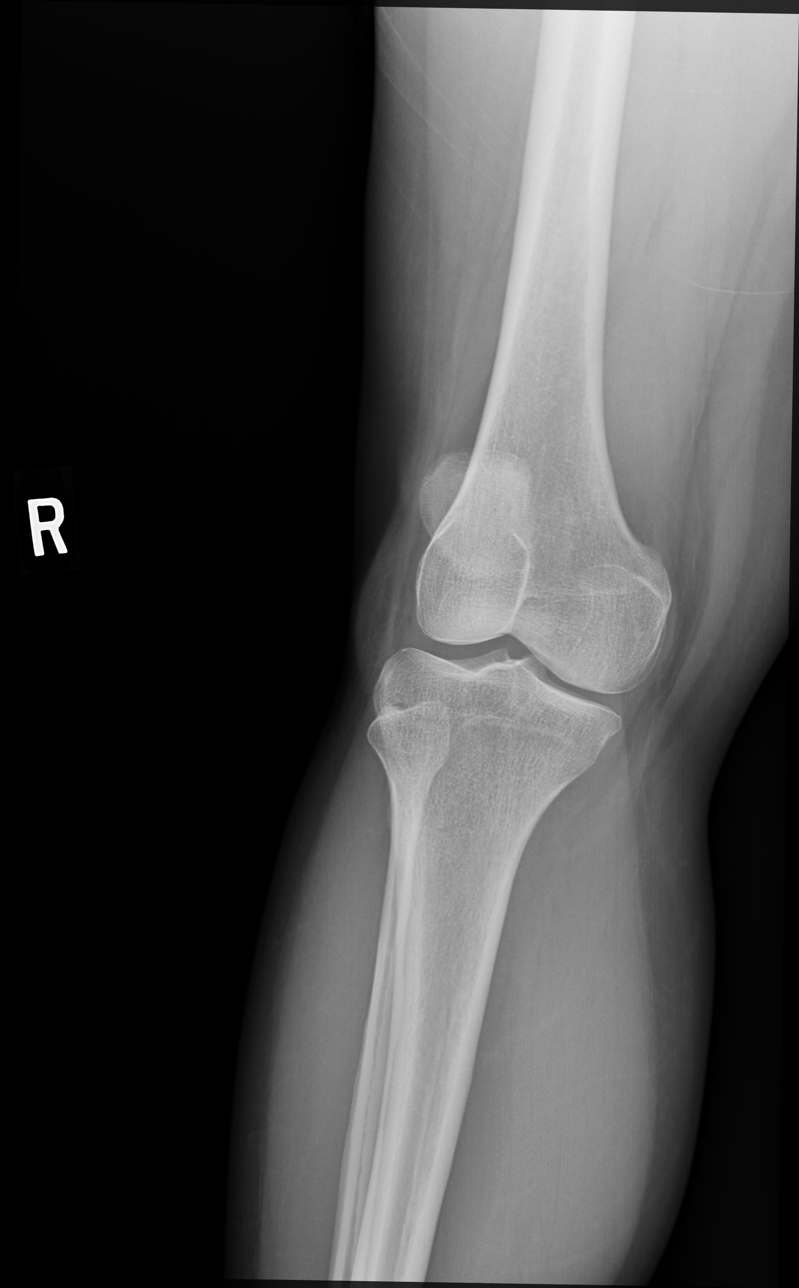

[knee standing internal ap]
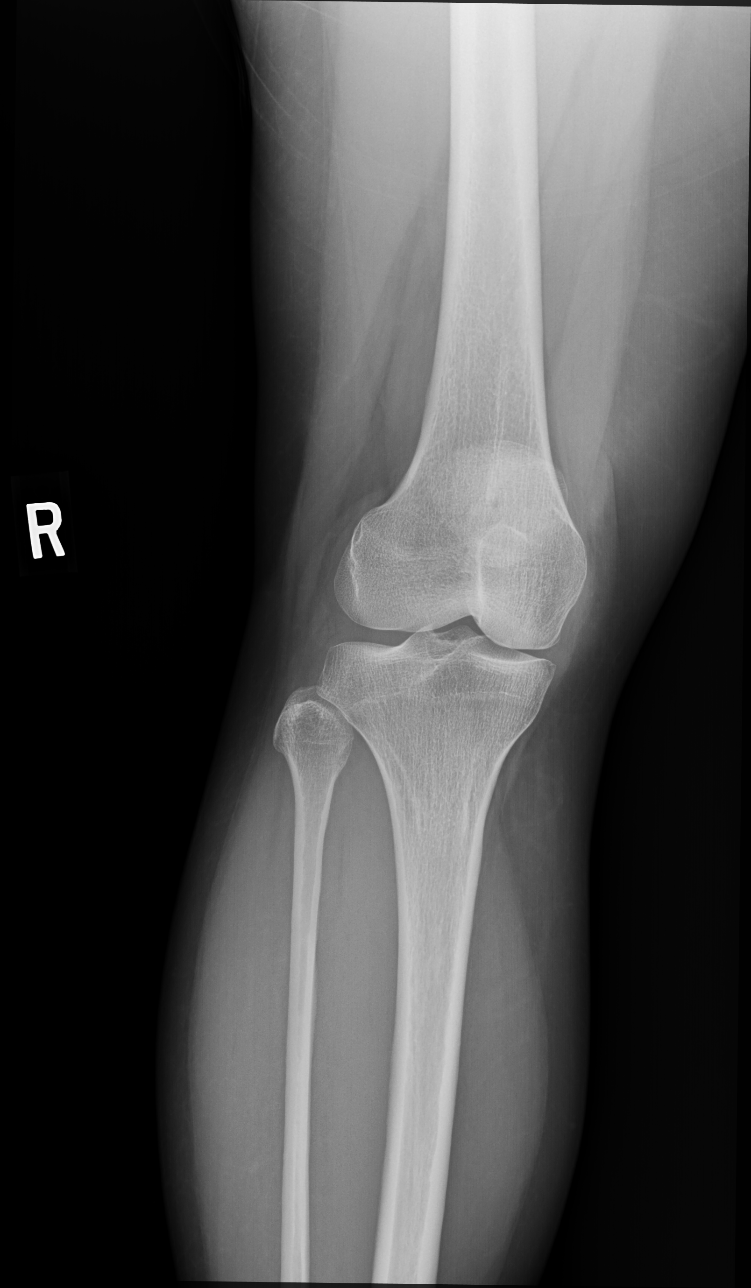

[knee standing lat]
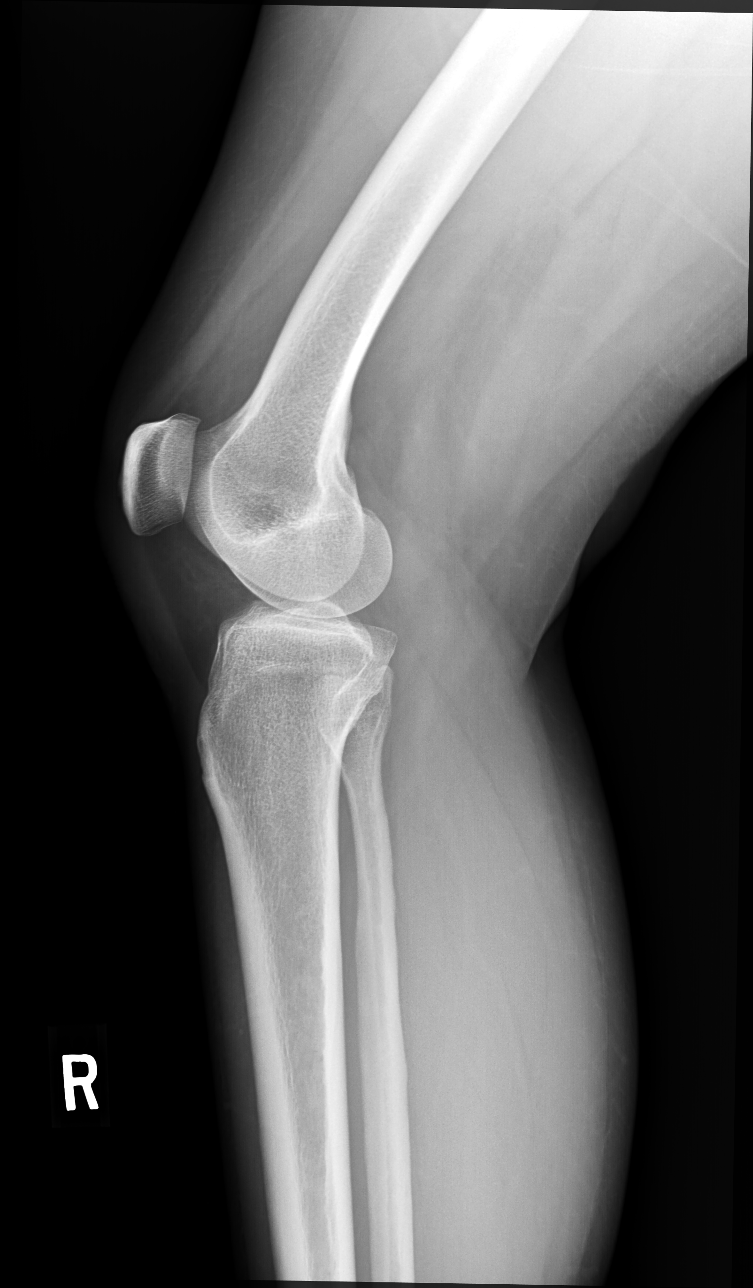

[knee [person_name] view pa]
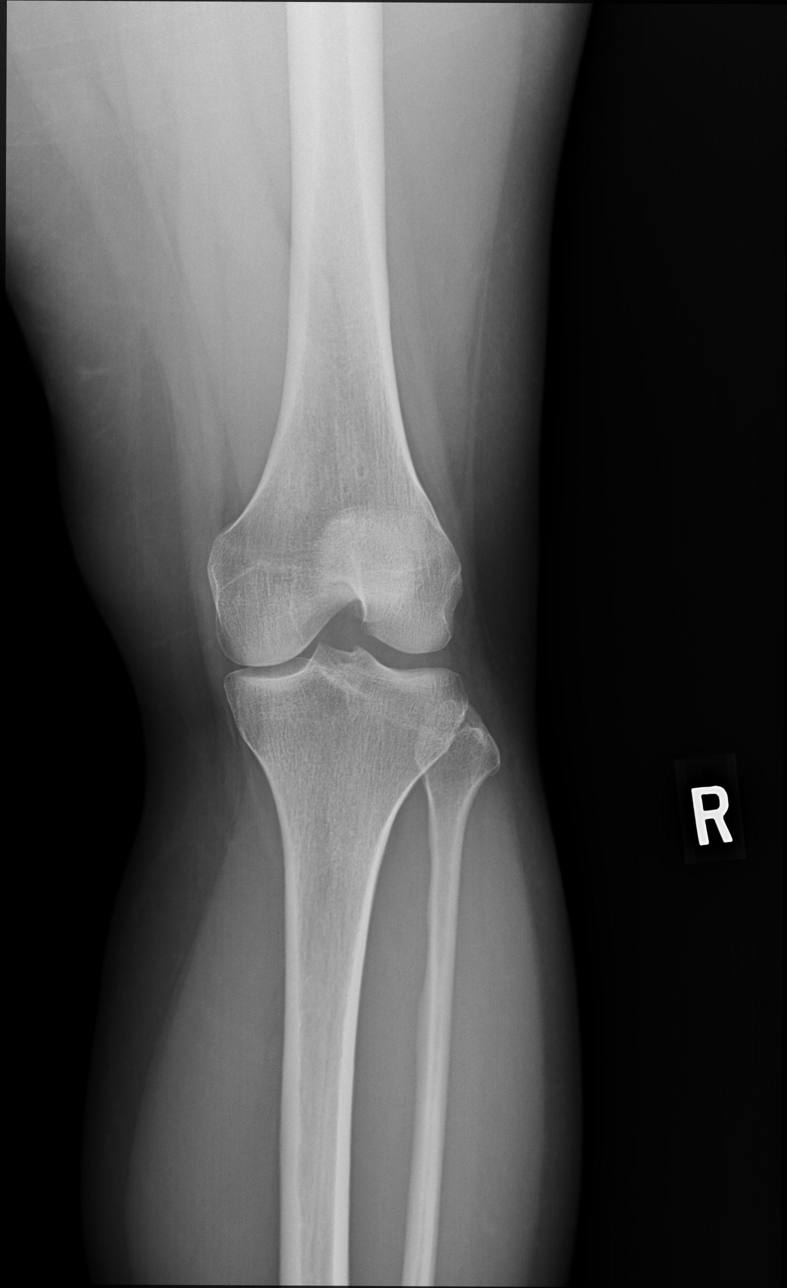

[5 of 5 positions shown; findings below may reference images not displayed]

FINDINGS: The mineralization and alignment are normal. There is no evidence of
acute fracture or dislocation. The joint spaces appear preserved. No
significant joint effusion or focal soft tissue abnormality
identified.
IMPRESSION: No acute osseous findings or significant joint effusion.

## 2022-10-30 DIAGNOSIS — F5222 Female sexual arousal disorder: Secondary | ICD-10-CM | POA: Diagnosis not present

## 2022-11-02 DIAGNOSIS — F5222 Female sexual arousal disorder: Secondary | ICD-10-CM | POA: Diagnosis not present

## 2022-11-03 DIAGNOSIS — F5222 Female sexual arousal disorder: Secondary | ICD-10-CM | POA: Diagnosis not present

## 2022-11-30 DIAGNOSIS — F5222 Female sexual arousal disorder: Secondary | ICD-10-CM | POA: Diagnosis not present

## 2022-12-28 DIAGNOSIS — F5222 Female sexual arousal disorder: Secondary | ICD-10-CM | POA: Diagnosis not present

## 2023-01-25 DIAGNOSIS — F5222 Female sexual arousal disorder: Secondary | ICD-10-CM | POA: Diagnosis not present

## 2023-02-06 ENCOUNTER — Ambulatory Visit: Payer: BC Managed Care – PPO | Admitting: Family Medicine

## 2023-02-06 ENCOUNTER — Encounter: Payer: Self-pay | Admitting: Family Medicine

## 2023-02-06 VITALS — BP 122/72 | HR 72 | Temp 98.1°F | Ht 63.0 in | Wt 211.6 lb

## 2023-02-06 DIAGNOSIS — E669 Obesity, unspecified: Secondary | ICD-10-CM | POA: Diagnosis not present

## 2023-02-06 DIAGNOSIS — Z23 Encounter for immunization: Secondary | ICD-10-CM

## 2023-02-06 DIAGNOSIS — Z13220 Encounter for screening for lipoid disorders: Secondary | ICD-10-CM

## 2023-02-06 DIAGNOSIS — L729 Follicular cyst of the skin and subcutaneous tissue, unspecified: Secondary | ICD-10-CM | POA: Diagnosis not present

## 2023-02-06 DIAGNOSIS — R21 Rash and other nonspecific skin eruption: Secondary | ICD-10-CM | POA: Diagnosis not present

## 2023-02-06 LAB — COMPREHENSIVE METABOLIC PANEL
ALT: 18 U/L (ref 0–35)
AST: 16 U/L (ref 0–37)
Albumin: 4.1 g/dL (ref 3.5–5.2)
Alkaline Phosphatase: 61 U/L (ref 39–117)
BUN: 8 mg/dL (ref 6–23)
CO2: 29 mEq/L (ref 19–32)
Calcium: 9.3 mg/dL (ref 8.4–10.5)
Chloride: 104 mEq/L (ref 96–112)
Creatinine, Ser: 0.61 mg/dL (ref 0.40–1.20)
GFR: 115.62 mL/min (ref >60.00)
Glucose, Bld: 92 mg/dL (ref 70–99)
Potassium: 4 mEq/L (ref 3.5–5.1)
Sodium: 138 mEq/L (ref 135–145)
Total Bilirubin: 0.4 mg/dL (ref 0.2–1.2)
Total Protein: 7.1 g/dL (ref 6.0–8.3)

## 2023-02-06 LAB — HEMOGLOBIN A1C: Hgb A1c MFr Bld: 5.4 % (ref 4.6–6.5)

## 2023-02-06 LAB — LIPID PANEL
Cholesterol: 167 mg/dL (ref 0–200)
HDL: 50.6 mg/dL (ref >39.00)
LDL Cholesterol: 103 mg/dL — ABNORMAL HIGH (ref 0–99)
NonHDL: 116.79
Total CHOL/HDL Ratio: 3
Triglycerides: 67 mg/dL (ref 0.0–149.0)
VLDL: 13.4 mg/dL (ref 0.0–40.0)

## 2023-02-06 MED ORDER — TRIAMCINOLONE ACETONIDE 0.1 % EX CREA: 1.0000 | TOPICAL_CREAM | Freq: Two times a day (BID) | CUTANEOUS | 0 refills | Status: DC

## 2023-02-06 NOTE — Progress Notes (Signed)
[name], MD Phone: [phone]  Angela Mccormick is a 37 y.o. female who presents today for same-day visit.  Rash: Patient notes rash has been present on her left breast intermittently throughout the summer.  Notes this has been constant since June.  Notes more recently she has developed similar rash on her right breast.  Notes it itches and at times is uncomfortable particularly when she put something topical on it.  She notes no changes in soaps or detergents.  She does have a history of eczema.  She has tried topical hydrocortisone and Benadryl cream.  Cyst: Patient reports possible cyst in her right lower quadrant.  Notes her husband's been able to squeeze white material out of this.  At times it is painful.  Obesity: Patient is walking 10 minutes most days.  She is try to increase fruits and vegetables and increase water intake.  Not much fried food or sweets.  Social History   Tobacco Use  Smoking Status Never  Smokeless Tobacco Never    Current Outpatient Medications on File Prior to Visit  Medication Sig Dispense Refill   albuterol (VENTOLIN HFA) 108 (90 Base) MCG/ACT inhaler INHALE 2 PUFFS BY MOUTH EVERY 6 HOURS AS NEEDED FOR WHEEZING FOR SHORTNESS OF BREATH 9 g 3   buPROPion (WELLBUTRIN XL) 300 MG 24 hr tablet Take 1 tablet (300 mg total) by mouth daily after lunch. 90 tablet 1   Multiple Vitamins-Minerals (EQ MULTIVITAMINS ADULT GUMMY PO) Take one gummy by mouth daily.     nystatin (MYCOSTATIN/NYSTOP) powder Apply 1 Application topically 3 (three) times daily. 15 g 0   testosterone cypionate (DEPOTESTOSTERONE CYPIONATE) 200 MG/ML injection Inject into the muscle.     No current facility-administered medications on file prior to visit.     ROS see history of present illness  Objective  Physical Exam Vitals:   02/06/23 0934  BP: 122/72  Pulse: 72  Temp: 98.1 F (36.7 C)  SpO2: 99%    BP Readings from Last 3 Encounters:  02/06/23 122/72  07/24/22  118/76  05/18/22 115/70   Wt Readings from Last 3 Encounters:  02/06/23 211 lb 9.6 oz (96 kg)  05/18/22 208 lb (94.3 kg)  03/27/22 208 lb (94.3 kg)    Physical Exam Chest:     Comments: Prince Solian, CMA served as chaperone Abdominal:      Right breast, nontender, no induration, some flaking, no palpable underlying lesions   Left breast, nontender, no underlying palpable lesions, some flaking, no induration   Assessment/Plan: Please see individual problem list.  Rash Assessment & Plan: Chronic issue.  Patient with likely eczema on bilateral breasts.  We will trial topical triamcinolone.  Will have her return in 2 weeks for recheck.  Also referring to dermatology.  Orders: -     Ambulatory referral to Dermatology -     Triamcinolone Acetonide; Apply 1 Application topically 2 (two) times daily.  Dispense: 30 g; Refill: 0  Subcutaneous cyst Assessment & Plan: Suspect a cyst in her right lower quadrant.  No signs of infection at this time.  We will refer to dermatology to consider removal.  If she develops signs of infection she will let us know.  Orders: -     Ambulatory referral to Dermatology -     Triamcinolone Acetonide; Apply 1 Application topically 2 (two) times daily.  Dispense: 30 g; Refill: 0  Obesity (BMI 30.0-34.9) -     Hemoglobin A1c  Lipid screening -  Comprehensive metabolic panel -     Lipid panel  Encounter for administration of vaccine -     Flu vaccine trivalent PF, 6mos and older(Flulaval,Afluria,Fluarix,Fluzone)    Return in about 2 weeks (around 02/20/2023) for Recheck rash.   [name], MD [facility] Primary Care [facility]

## 2023-02-06 NOTE — Assessment & Plan Note (Signed)
Suspect a cyst in her right lower quadrant.  No signs of infection at this time.  We will refer to dermatology to consider removal.  If she develops signs of infection she will let us know.

## 2023-02-06 NOTE — Assessment & Plan Note (Addendum)
Chronic issue.  Patient with likely eczema on bilateral breasts.  We will trial topical triamcinolone.  Will have her return in 2 weeks for recheck.  Also referring to dermatology.

## 2023-02-20 ENCOUNTER — Ambulatory Visit (INDEPENDENT_AMBULATORY_CARE_PROVIDER_SITE_OTHER): Payer: BC Managed Care – PPO | Admitting: Family Medicine

## 2023-02-20 ENCOUNTER — Encounter: Payer: Self-pay | Admitting: Family Medicine

## 2023-02-20 VITALS — BP 132/76 | HR 72 | Temp 98.2°F | Ht 63.0 in | Wt 207.6 lb

## 2023-02-20 DIAGNOSIS — R21 Rash and other nonspecific skin eruption: Secondary | ICD-10-CM | POA: Diagnosis not present

## 2023-02-20 NOTE — Assessment & Plan Note (Signed)
Chronic issue.  Suspect related to eczema.  Has improved some.  Right breast rash has resolved.  Left breast rash improved.  She will continue to use topical triamcinolone.  Discussed she could use this for 2 weeks in a row and then we need to take a few days off before using again.  She will see dermatology as planned.  If this worsens at all she will let us know.

## 2023-02-20 NOTE — Progress Notes (Signed)
  Angela Alar, MD Phone: [phone]  Angela Mccormick is a 37 y.o. female who presents today for f/u.  Rash: has improved. Rash on Right breast has resolved with triamcinolone. Rash on left breast resolved though has subsequently recurred. She has been using the triamcinolone on this. Itching some. Is still better than it was a couple weeks ago. Has dermatology appointment in about a month.   Social History   Tobacco Use  Smoking Status Never  Smokeless Tobacco Never    Current Outpatient Medications on File Prior to Visit  Medication Sig Dispense Refill   albuterol (VENTOLIN HFA) 108 (90 Base) MCG/ACT inhaler INHALE 2 PUFFS BY MOUTH EVERY 6 HOURS AS NEEDED FOR WHEEZING FOR SHORTNESS OF BREATH 9 g 3   buPROPion (WELLBUTRIN XL) 300 MG 24 hr tablet Take 1 tablet (300 mg total) by mouth daily after lunch. 90 tablet 1   Multiple Vitamins-Minerals (EQ MULTIVITAMINS ADULT GUMMY PO) Take one gummy by mouth daily.     nystatin (MYCOSTATIN/NYSTOP) powder Apply 1 Application topically 3 (three) times daily. 15 g 0   testosterone cypionate (DEPOTESTOSTERONE CYPIONATE) 200 MG/ML injection Inject into the muscle.     triamcinolone cream (KENALOG) 0.1 % Apply 1 Application topically 2 (two) times daily. 30 g 0   No current facility-administered medications on file prior to visit.     ROS see history of present illness  Objective  Physical Exam Vitals:   02/20/23 0957  BP: 132/76  Pulse: 72  Temp: 98.2 F (36.8 C)  SpO2: 98%    BP Readings from Last 3 Encounters:  02/20/23 132/76  02/06/23 122/72  07/24/22 118/76   Wt Readings from Last 3 Encounters:  02/20/23 207 lb 9.6 oz (94.2 kg)  02/06/23 211 lb 9.6 oz (96 kg)  05/18/22 208 lb (94.3 kg)    Physical Exam  Left breast  Assessment/Plan: Please see individual problem list.  Rash Assessment & Plan: Chronic issue.  Suspect related to eczema.  Has improved some.  Right breast rash has resolved.  Left breast rash  improved.  She will continue to use topical triamcinolone.  Discussed she could use this for 2 weeks in a row and then we need to take a few days off before using again.  She will see dermatology as planned.  If this worsens at all she will let us know.      Return if symptoms worsen or fail to improve.   Angela Alar, MD [facility] Primary Care [facility]

## 2023-02-22 DIAGNOSIS — F5222 Female sexual arousal disorder: Secondary | ICD-10-CM | POA: Diagnosis not present

## 2023-03-13 ENCOUNTER — Other Ambulatory Visit: Payer: Self-pay | Admitting: Family Medicine

## 2023-03-13 DIAGNOSIS — L729 Follicular cyst of the skin and subcutaneous tissue, unspecified: Secondary | ICD-10-CM

## 2023-03-13 DIAGNOSIS — R21 Rash and other nonspecific skin eruption: Secondary | ICD-10-CM

## 2023-03-13 DIAGNOSIS — Z5181 Encounter for therapeutic drug level monitoring: Secondary | ICD-10-CM | POA: Diagnosis not present

## 2023-03-13 DIAGNOSIS — G43009 Migraine without aura, not intractable, without status migrainosus: Secondary | ICD-10-CM | POA: Diagnosis not present

## 2023-03-16 MED ORDER — TRIAMCINOLONE ACETONIDE 0.1 % EX CREA: 1.0000 | TOPICAL_CREAM | Freq: Two times a day (BID) | CUTANEOUS | 0 refills | Status: DC

## 2023-03-16 NOTE — Telephone Encounter (Signed)
(  Left breast rash improved. She will continue to use topical triamcinolone.)  from your note on 02/20/23 so refilled Triamcinolone but should Nystatin be refilled.

## 2023-03-19 NOTE — Telephone Encounter (Signed)
Can someone contact the patient and see why she requested the nystatin?  Is she having an issue that would require this?

## 2023-03-23 DIAGNOSIS — F5222 Female sexual arousal disorder: Secondary | ICD-10-CM | POA: Diagnosis not present

## 2023-03-24 MED ORDER — NYSTATIN 100000 UNIT/GM EX POWD: 1.0000 | Freq: Three times a day (TID) | CUTANEOUS | 0 refills | Status: DC

## 2023-03-27 ENCOUNTER — Ambulatory Visit: Payer: BC Managed Care – PPO | Admitting: Dermatology

## 2023-03-27 ENCOUNTER — Encounter: Payer: Self-pay | Admitting: Dermatology

## 2023-03-27 DIAGNOSIS — L720 Epidermal cyst: Secondary | ICD-10-CM

## 2023-03-27 DIAGNOSIS — R21 Rash and other nonspecific skin eruption: Secondary | ICD-10-CM

## 2023-03-27 DIAGNOSIS — L739 Follicular disorder, unspecified: Secondary | ICD-10-CM | POA: Diagnosis not present

## 2023-03-27 DIAGNOSIS — L308 Other specified dermatitis: Secondary | ICD-10-CM

## 2023-03-27 MED ORDER — CLOBETASOL PROPIONATE 0.05 % EX CREA: 1.0000 | TOPICAL_CREAM | Freq: Two times a day (BID) | CUTANEOUS | 0 refills | Status: DC

## 2023-03-27 MED ORDER — CLOBETASOL PROPIONATE 0.05 % EX OINT: 1.0000 | TOPICAL_OINTMENT | Freq: Two times a day (BID) | CUTANEOUS | 0 refills | Status: DC

## 2023-03-27 MED ORDER — DOXYCYCLINE MONOHYDRATE 100 MG PO CAPS: 100.0000 mg | ORAL_CAPSULE | Freq: Two times a day (BID) | ORAL | 0 refills | Status: DC

## 2023-03-27 NOTE — Progress Notes (Signed)
   New Patient Visit   Subjective  Angela Mccormick is a 37 y.o. female who presents for the following: Rash  At left breast. Started during the summer at both breasts, itchy, comes and goes, using TMC 0.1% cream for 2 weeks which gets rid of rash but once she stops it the rash comes back. No new bras or anything new touching areas of rash.  Patient also with a cyst at right lower abdomen, present for a few months, has drained.   The following portions of the chart were reviewed this encounter and updated as appropriate: medications, allergies, medical history  Review of Systems:  No other skin or systemic complaints except as noted in HPI or Assessment and Plan.  Objective  Well appearing patient in no apparent distress; mood and affect are within normal limits.  A focused examination was performed of the following areas: Breasts, abdomen  Relevant exam findings are noted in the Assessment and Plan.    Assessment & Plan   Other eczema  Related Medications clobetasol cream (TEMOVATE) 0.05 % Apply 1 Application topically 2 (two) times daily. To affected areas of rash until smooth, then stop. Avoid applying to face, groin, and axilla. Use as directed. Long-term use can cause thinning of the skin.  EIC (epidermal inclusion cyst)  Related Medications doxycycline (MONODOX) 100 MG capsule Take 1 capsule (100 mg total) by mouth 2 (two) times daily. Take with food.  Folliculitis  Related Medications doxycycline (MONODOX) 100 MG capsule Take 1 capsule (100 mg total) by mouth 2 (two) times daily. Take with food.   RASH, suspect eczema Exam: left lateral breast with pink patch with focal scaling and hemorrhagic crusting. Right lateral breast with postinflammatory erythema  Treatment Plan: Start clobetasol 0.05% cream twice daily to affected areas of rash until smooth. Avoid applying to face, groin, and axilla. Use as directed. Long-term use can cause thinning of the  skin.  Topical steroids (such as triamcinolone, fluocinolone, fluocinonide, mometasone, clobetasol, halobetasol, betamethasone, hydrocortisone) can cause thinning and lightening of the skin if they are used for too long in the same area. Your physician has selected the right strength medicine for your problem and area affected on the body. Please use your medication only as directed by your physician to prevent side effects.   EPIDERMAL INCLUSION CYST vs HS vs Folliculitis Exam: Subcutaneous nodule at right and left lower abdomen  Benign-appearing. Exam most consistent with an epidermal inclusion cyst. Discussed that a cyst is a benign growth that can grow over time and sometimes get irritated or inflamed. Recommend observation if it is not bothersome. Discussed option of surgical excision to remove it if it is growing, symptomatic, or other changes noted. Please call for new or changing lesions so they can be evaluated.  Recommend HibiClens daily to wash affected areas.  Start doxycycline monohydrate 100 mg twice daily with food x 4 weeks.   Doxycycline should be taken with food to prevent nausea. Do not lay down for 30 minutes after taking. Be cautious with sun exposure and use good sun protection while on this medication. Pregnant women should not take this medication.     Return in about 1 month (around 04/26/2023) for Rash, cyst, with Dr. Katrinka Blazing.  Anise Salvo, RMA, am acting as scribe for Elie Goody, MD .   Documentation: I have reviewed the above documentation for accuracy and completeness, and I agree with the above.  Elie Goody, MD

## 2023-03-27 NOTE — Patient Instructions (Addendum)
Treatment Plan: Start clobetasol 0.05% cream twice daily to affected areas of rash until smooth. Avoid applying to face, groin, and axilla. Use as directed. Long-term use can cause thinning of the skin.  Topical steroids (such as triamcinolone, fluocinolone, fluocinonide, mometasone, clobetasol, halobetasol, betamethasone, hydrocortisone) can cause thinning and lightening of the skin if they are used for too long in the same area. Your physician has selected the right strength medicine for your problem and area affected on the body. Please use your medication only as directed by your physician to prevent side effects.   Recommend HibiClens daily to wash affected areas. Start doxycycline monohydrate 100 mg twice daily with food x 2 weeks.   Doxycycline should be taken with food to prevent nausea. Do not lay down for 30 minutes after taking. Be cautious with sun exposure and use good sun protection while on this medication. Pregnant women should not take this medication.     Due to recent changes in healthcare laws, you may see results of your pathology and/or laboratory studies on MyChart before the doctors have had a chance to review them. We understand that in some cases there may be results that are confusing or concerning to you. Please understand that not all results are received at the same time and often the doctors may need to interpret multiple results in order to provide you with the best plan of care or course of treatment. Therefore, we ask that you please give [place] 2 business days to thoroughly review all your results before contacting the office for clarification. Should we see a critical lab result, you will be contacted sooner.   If You Need Anything After Your Visit  If you have any questions or concerns for your doctor, please call our main line at [phone] and press option 4 to reach your doctor's medical assistant. If no one answers, please leave a voicemail as directed and we  will return your call as soon as possible. Messages left after 4 pm will be answered the following business day.   You may also send [place] a message via MyChart. We typically respond to MyChart messages within 1-2 business days.  For prescription refills, please ask your pharmacy to contact our office. Our fax number is [phone].  If you have an urgent issue when the clinic is closed that cannot wait until the next business day, you can page your doctor at the number below.    Please note that while we do our best to be available for urgent issues outside of office hours, we are not available 24/7.   If you have an urgent issue and are unable to reach [place], you may choose to seek medical care at your doctor's office, retail clinic, urgent care center, or emergency room.  If you have a medical emergency, please immediately call 911 or go to the emergency department.  Pager Numbers  - Dr. Gwen Pounds: [phone]  - Dr. Roseanne Reno: [phone]  - Dr. Katrinka Blazing: [phone]   In the event of inclement weather, please call our main line at [phone] for an update on the status of any delays or closures.  Dermatology Medication Tips: Please keep the boxes that topical medications come in in order to help keep track of the instructions about where and how to use these. Pharmacies typically print the medication instructions only on the boxes and not directly on the medication tubes.   If your medication is too expensive, please contact our office at [phone] option 4 or  send [place] a message through MyChart.   We are unable to tell what your co-pay for medications will be in advance as this is different depending on your insurance coverage. However, we may be able to find a substitute medication at lower cost or fill out paperwork to get insurance to cover a needed medication.   If a prior authorization is required to get your medication covered by your insurance company, please allow [place] 1-2  business days to complete this process.  Drug prices often vary depending on where the prescription is filled and some pharmacies may offer cheaper prices.  The website www.goodrx.com contains coupons for medications through different pharmacies. The prices here do not account for what the cost may be with help from insurance (it may be cheaper with your insurance), but the website can give you the price if you did not use any insurance.  - You can print the associated coupon and take it with your prescription to the pharmacy.  - You may also stop by our office during regular business hours and pick up a GoodRx coupon card.  - If you need your prescription sent electronically to a different pharmacy, notify our office through [facility] or by phone at [phone] option 4.

## 2023-03-29 ENCOUNTER — Other Ambulatory Visit: Payer: Self-pay

## 2023-03-29 DIAGNOSIS — L308 Other specified dermatitis: Secondary | ICD-10-CM

## 2023-03-29 MED ORDER — CLOBETASOL PROPIONATE 0.05 % EX CREA: 1.0000 | TOPICAL_CREAM | Freq: Two times a day (BID) | CUTANEOUS | 0 refills | Status: DC

## 2023-03-29 NOTE — Progress Notes (Signed)
[facility] sent fax that amount of grams needed per day and where to use for insurance purposes. RX re sent. aw

## 2023-04-04 ENCOUNTER — Other Ambulatory Visit: Payer: Self-pay

## 2023-04-04 DIAGNOSIS — L308 Other specified dermatitis: Secondary | ICD-10-CM

## 2023-04-04 MED ORDER — CLOBETASOL PROPIONATE 0.05 % EX CREA: 1.0000 | TOPICAL_CREAM | Freq: Two times a day (BID) | CUTANEOUS | 0 refills | Status: AC

## 2023-04-10 DIAGNOSIS — N898 Other specified noninflammatory disorders of vagina: Secondary | ICD-10-CM | POA: Diagnosis not present

## 2023-04-16 DIAGNOSIS — F5222 Female sexual arousal disorder: Secondary | ICD-10-CM | POA: Diagnosis not present

## 2023-04-25 DIAGNOSIS — Z13220 Encounter for screening for lipoid disorders: Secondary | ICD-10-CM | POA: Diagnosis not present

## 2023-04-25 DIAGNOSIS — Z0000 Encounter for general adult medical examination without abnormal findings: Secondary | ICD-10-CM | POA: Diagnosis not present

## 2023-04-25 DIAGNOSIS — E669 Obesity, unspecified: Secondary | ICD-10-CM | POA: Diagnosis not present

## 2023-04-25 LAB — BASIC METABOLIC PANEL
BUN: 9 (ref 4–21)
CO2: 27 — AB (ref 13–22)
Chloride: 105 (ref 99–108)
Creatinine: 0.6 (ref 0.5–1.1)
Glucose: 92
Potassium: 4 meq/L (ref 3.5–5.1)
Sodium: 139 (ref 137–147)

## 2023-04-25 LAB — CBC AND DIFFERENTIAL
HCT: 37 (ref 36–46)
Hemoglobin: 12.2 (ref 12.0–16.0)
Platelets: 297 10*3/uL (ref 150–400)
WBC: 5.2

## 2023-04-25 LAB — LIPID PANEL
Cholesterol: 149 (ref 0–200)
HDL: 44 (ref 35–70)
LDL Cholesterol: 85
Triglycerides: 99 (ref 40–160)

## 2023-04-25 LAB — COMPREHENSIVE METABOLIC PANEL
Calcium: 9.3 (ref 8.7–10.7)
eGFR: 120

## 2023-04-25 LAB — CBC: RBC: 4.18 (ref 3.87–5.11)

## 2023-04-26 ENCOUNTER — Encounter: Payer: Self-pay | Admitting: Dermatology

## 2023-04-26 ENCOUNTER — Ambulatory Visit: Payer: BC Managed Care – PPO | Admitting: Dermatology

## 2023-04-26 DIAGNOSIS — L720 Epidermal cyst: Secondary | ICD-10-CM

## 2023-04-26 DIAGNOSIS — L308 Other specified dermatitis: Secondary | ICD-10-CM

## 2023-04-26 DIAGNOSIS — D492 Neoplasm of unspecified behavior of bone, soft tissue, and skin: Secondary | ICD-10-CM | POA: Diagnosis not present

## 2023-04-26 DIAGNOSIS — L739 Follicular disorder, unspecified: Secondary | ICD-10-CM

## 2023-04-26 DIAGNOSIS — L309 Dermatitis, unspecified: Secondary | ICD-10-CM | POA: Diagnosis not present

## 2023-04-26 MED ORDER — DOXYCYCLINE MONOHYDRATE 100 MG PO CAPS: 100.0000 mg | ORAL_CAPSULE | Freq: Two times a day (BID) | ORAL | 1 refills | Status: AC

## 2023-04-26 NOTE — Patient Instructions (Addendum)

## 2023-04-26 NOTE — Progress Notes (Signed)
Follow-Up Visit   Subjective  Angela Mccormick is a 37 y.o. female who presents for the following: Rash suspect Eczema breast,Clobetasol bid itchy still,  Cyst vs HS vs Folliculitis R and L abdomen Doxycycline 100mg  bid, improved The patient has spots, moles and lesions to be evaluated, some may be new or changing and the patient may have concern these could be cancer.   The following portions of the chart were reviewed this encounter and updated as appropriate: medications, allergies, medical history  Review of Systems:  No other skin or systemic complaints except as noted in HPI or Assessment and Plan.  Objective  Well appearing patient in no apparent distress; mood and affect are within normal limits.   A focused examination was performed of the following areas: Breast, abdomen  Relevant exam findings are noted in the Assessment and Plan.  L lat breast Pink scaly patch   Assessment & Plan     HS vs Folliculitis Exam: postinflammatory follicular erythema on R and L lower abdomen, improved   Benign-appearing. Exam most consistent with an epidermal inclusion cyst. Discussed that a cyst is a benign growth that can grow over time and sometimes get irritated or inflamed. Recommend observation if it is not bothersome. Discussed option of surgical excision to remove it if it is growing, symptomatic, or other changes noted. Please call for new or changing lesions so they can be evaluated.  Cont Doxycycline 100mg  1 po bid with food and drink  Doxycycline should be taken with food to prevent nausea. Do not lay down for 30 minutes after taking. Be cautious with sun exposure and use good sun protection while on this medication. Pregnant women should not take this medication.    NEOPLASM OF SKIN L lat breast Epidermal / dermal shaving  Informed consent: discussed and consent obtained   Timeout: patient name, date of birth, surgical site, and procedure verified   Procedure prep:   Patient was prepped and draped in usual sterile fashion Prep type:  Povidone-iodine and isopropyl alcohol Anesthesia: the lesion was anesthetized in a standard fashion   Anesthetic:  1% lidocaine w/ epinephrine 1-100,000 buffered w/ 8.4% NaHCO3 Instrument used: DermaBlade   Hemostasis achieved with: pressure and aluminum chloride   Outcome: patient tolerated procedure well   Post-procedure details: wound care instructions given   Specimen 1 - Surgical pathology Differential Diagnosis: Eczema vs Tinea vs lichen sclerosus vs other  Check Margins: No Pink scaly patch May continue Clobetasol if helps with itching otherwise can d/c  Topical steroids (such as triamcinolone, fluocinolone, fluocinonide, mometasone, clobetasol, halobetasol, betamethasone, hydrocortisone) can cause thinning and lightening of the skin if they are used for too long in the same area. Your physician has selected the right strength medicine for your problem and area affected on the body. Please use your medication only as directed by your physician to prevent side effects.   EIC (EPIDERMAL INCLUSION CYST)   Related Medications doxycycline (MONODOX) 100 MG capsule Take 1 capsule (100 mg total) by mouth 2 (two) times daily. Take with food. FOLLICULITIS   Related Medications doxycycline (MONODOX) 100 MG capsule Take 1 capsule (100 mg total) by mouth 2 (two) times daily. Take with food.  Return in about 1 month (around 05/27/2023) for f/u Rash, Cyst.  I, Sonya Hupman, RMA, am acting as scribe for Elie Goody, MD .   Documentation: I have reviewed the above documentation for accuracy and completeness, and I agree with the above.  Elie Goody, MD

## 2023-05-02 LAB — SURGICAL PATHOLOGY

## 2023-05-03 ENCOUNTER — Encounter: Payer: Self-pay | Admitting: Dermatology

## 2023-05-14 DIAGNOSIS — F5222 Female sexual arousal disorder: Secondary | ICD-10-CM | POA: Diagnosis not present

## 2023-05-22 ENCOUNTER — Encounter: Payer: BC Managed Care – PPO | Admitting: Family Medicine

## 2023-05-22 ENCOUNTER — Telehealth: Payer: Self-pay

## 2023-05-22 DIAGNOSIS — Z6837 Body mass index (BMI) 37.0-37.9, adult: Secondary | ICD-10-CM | POA: Diagnosis not present

## 2023-05-22 DIAGNOSIS — E669 Obesity, unspecified: Secondary | ICD-10-CM | POA: Diagnosis not present

## 2023-05-22 NOTE — Telephone Encounter (Signed)
 Abstracted labs from 04-25-23

## 2023-05-23 ENCOUNTER — Ambulatory Visit: Payer: BC Managed Care – PPO | Admitting: Family Medicine

## 2023-05-23 ENCOUNTER — Encounter: Payer: Self-pay | Admitting: Family Medicine

## 2023-05-23 VITALS — BP 116/60 | HR 70 | Temp 98.0°F | Ht 63.0 in | Wt 214.8 lb

## 2023-05-23 DIAGNOSIS — Z0000 Encounter for general adult medical examination without abnormal findings: Secondary | ICD-10-CM

## 2023-05-23 NOTE — Progress Notes (Signed)
 Camellia Her, MD Phone: [phone]  Angela Mccormick is a 38 y.o. female who presents today for CPE.  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 Exercise: Reports going to start going to the gym Pap smear: Up-to-date through GYN Family history-  Colon cancer: maternal grandmother  Breast cancer: paternal great grandmother  Ovarian cancer: no Menses: 1x/month, 2-3 days Vaccines-   Flu: UTD  Tetanus: UTD  COVID19: UTD HIV screening: UTD Hep C Screening: defers, has donated blood previously in 2018 Tobacco use: no Alcohol use: no Illicit Drug use: no Dentist: yes Ophthalmology: yes   Active Ambulatory Problems    Diagnosis Date Noted   Chiari malformation (HCC) 09/26/2012   Migraines 09/26/2012   Nodule of neck 11/01/2016   Rectal bleeding 03/04/2018   Dizziness 09/08/2019   Asthma 12/08/2019   Routine general medical examination at a health care facility 03/05/2020   Anxiety and depression 03/05/2020   Obesity (BMI 30.0-34.9) 03/05/2020   Congestion of nasal sinus 04/19/2020   Rash 04/19/2020   Abnormal menses 07/01/2020   Hypersomnia 10/08/2020   Right knee pain 01/10/2021   Low libido 01/10/2021   Urine frequency 04/05/2021   Ear itching 09/27/2021   Family history of cancer 02/08/2022   Genetic testing 03/07/2022   Subcutaneous cyst 02/06/2023   Resolved Ambulatory Problems    Diagnosis Date Noted   Syncope 09/26/2012   Rash 11/01/2016   Elevated glucose 04/13/2017   Trying to get pregnant 04/13/2017   BMI 37.0-37.9, adult 04/13/2017   Supervision of high risk pregnancy, antepartum 06/19/2017   Obesity affecting pregnancy, antepartum 06/19/2017   Influenza 07/27/2017   Nausea and vomiting during pregnancy 08/13/2017   Preterm premature rupture of membranes 12/15/2017   Postpartum care following cesarean delivery 12/20/2017    Routine general medical examination at a health care facility 03/04/2018   Nonhealing surgical wound 03/04/2018   Otitis media 04/27/2018   Short interval between pregnancies affecting pregnancy, antepartum 09/11/2018   History of preterm delivery, currently pregnant 11/06/2018   History of cesarean section 11/28/2018   History of gestational diabetes 03/14/2019   Preterm premature rupture of membranes 04/06/2019   Encounter for care and examination of lactating mother 04/10/2019   Shortness of breath 09/13/2019   Past Medical History:  Diagnosis Date   Depression    Gestational diabetes    History of Chiari malformation    History of syncope    Migraine     Family History  Problem Relation Age of Onset   Diabetes Mother    Stroke Mother    Cancer Mother        possible colon cancer   Diabetes Father    Brain cancer Father 38   Stroke Maternal Aunt    Brain cancer Maternal Aunt        x2; d. 26s   Colon cancer Maternal Grandmother        dx 70s   Stroke Maternal Grandfather    Heart disease Maternal Grandfather        CABG   Prostate cancer Maternal Grandfather        33s   Cancer Paternal Grandmother        bone cancer, d. 58s   Breast cancer Other    Breast cancer Paternal Great-grandmother  both PGGMs   Chiari malformation Son     Social History   Socioeconomic History   Marital status: Married    Spouse name: Not on file   Number of children: Not on file   Years of education: Not on file   Highest education level: Associate degree: academic program  Occupational History   Not on file  Tobacco Use   Smoking status: Never   Smokeless tobacco: Never  Vaping Use   Vaping status: Never Used  Substance and Sexual Activity   Alcohol use: Not Currently    Comment: 1 drink/year   Drug use: No   Sexual activity: Yes    Partners: Male    Birth control/protection: None  Other Topics Concern   Not on file  Social History Narrative   Not on file    Social Drivers of Health   Financial Resource Strain: Low Risk  (04/25/2023)   Received from [facility]   Overall Financial Resource Strain (CARDIA)    Difficulty of Paying Living Expenses: Not hard at all  Food Insecurity: No Food Insecurity (04/25/2023)   Received from [facility]   Hunger Vital Sign    Worried About Running Out of Food in the Last Year: Never true    Ran Out of Food in the Last Year: Never true  Transportation Needs: No Transportation Needs (01/31/2023)   PRAPARE - Administrator, Civil Service (Medical): No    Lack of Transportation (Non-Medical): No  Physical Activity: Insufficiently Active (01/31/2023)   Exercise Vital Sign    Days of Exercise per Week: 4 days    Minutes of Exercise per Session: 30 min  Stress: No Stress Concern Present (01/31/2023)   [employer] of Occupational Health - Occupational Stress Questionnaire    Feeling of Stress : Not at all  Social Connections: Moderately Integrated (01/31/2023)   Social Connection and Isolation Panel [NHANES]    Frequency of Communication with Friends and Family: More than three times a week    Frequency of Social Gatherings with Friends and Family: Once a week    Attends Religious Services: Never    Database Administrator or Organizations: Yes    Attends Engineer, Structural: 1 to 4 times per year    Marital Status: Married  Catering Manager Violence: Not on file    ROS  General:  Negative for nexplained weight loss, fever Skin: Negative for new or changing mole, sore that won't heal HEENT: Negative for trouble hearing, trouble seeing, ringing in ears, mouth sores, hoarseness, change in voice, dysphagia. CV:  Negative for chest pain, dyspnea, edema, palpitations Resp: Negative for cough, dyspnea, hemoptysis GI: Negative for nausea, vomiting, diarrhea, constipation, abdominal pain, melena, hematochezia. GU: Negative for dysuria, incontinence,  urinary hesitance, hematuria, vaginal or penile discharge, polyuria, sexual difficulty, lumps in testicle or breasts MSK: Negative for muscle cramps or aches, joint pain or swelling Neuro: Negative for headaches, weakness, numbness, dizziness, passing out/fainting Psych: Negative for depression, anxiety, memory problems  Objective  Physical Exam Vitals:   05/23/23 1302  BP: 116/60  Pulse: 70  Temp: 98 F (36.7 C)  SpO2: 98%    BP Readings from Last 3 Encounters:  05/23/23 116/60  02/20/23 132/76  02/06/23 122/72   Wt Readings from Last 3 Encounters:  05/23/23 214 lb 12.8 oz (97.4 kg)  02/20/23 207 lb 9.6 oz (94.2 kg)  02/06/23 211 lb 9.6 oz (96 kg)  Physical Exam Constitutional:      General: She is not in acute distress.    Appearance: She is not diaphoretic.  HENT:     Head: Normocephalic and atraumatic.  Cardiovascular:     Rate and Rhythm: Normal rate and regular rhythm.     Heart sounds: Normal heart sounds.  Pulmonary:     Effort: Pulmonary effort is normal.     Breath sounds: Normal breath sounds.  Abdominal:     General: Bowel sounds are normal. There is no distension.     Palpations: Abdomen is soft.     Tenderness: There is no abdominal tenderness.  Musculoskeletal:     Right lower leg: No edema.     Left lower leg: No edema.  Lymphadenopathy:     Cervical: No cervical adenopathy.  Skin:    General: Skin is warm and dry.  Neurological:     Mental Status: She is alert.  Psychiatric:        Mood and Affect: Mood normal.      Assessment/Plan:   Routine general medical examination at a health care facility Assessment & Plan: Physical exam completed.  Encouraged reducing soda intake and eating a healthy diet with plenty of fruits and vegetables and lean meats.  She will start going to the gym for exercise several times a week.  Patient reports she saw GYN in their weight loss clinic and they advised 1000-calorie diet per day.  I advised that she  should not drop her calorie intake to less than 1200.  Vaccines are up-to-date.  Recent lab work reviewed.     Return in about 6 months (around 11/20/2023) for transfer of care.   Camellia Her, MD [facility]

## 2023-05-23 NOTE — Assessment & Plan Note (Signed)
 Physical exam completed.  Encouraged reducing soda intake and eating a healthy diet with plenty of fruits and vegetables and lean meats.  She will start going to the gym for exercise several times a week.  Patient reports she saw GYN in their weight loss clinic and they advised 1000-calorie diet per day.  I advised that she should not drop her calorie intake to less than 1200.  Vaccines are up-to-date.  Recent lab work reviewed.

## 2023-05-31 ENCOUNTER — Ambulatory Visit: Payer: BC Managed Care – PPO | Admitting: Dermatology

## 2023-05-31 ENCOUNTER — Encounter: Payer: Self-pay | Admitting: Dermatology

## 2023-05-31 DIAGNOSIS — L308 Other specified dermatitis: Secondary | ICD-10-CM

## 2023-05-31 DIAGNOSIS — L739 Follicular disorder, unspecified: Secondary | ICD-10-CM

## 2023-05-31 DIAGNOSIS — L720 Epidermal cyst: Secondary | ICD-10-CM | POA: Diagnosis not present

## 2023-05-31 NOTE — Progress Notes (Signed)
   Follow-Up Visit   Subjective  Angela Mccormick is a 38 y.o. female who presents for the following: patient here for follow up on area at left lateral  breast, (bx proven spongiotic dermatitis. Patient stopped using clobetasol but states she is not currently using anything on area. She did not have time to pick up sample cream to use for rash. Patient is continueing doxycycline 100 mg bid to use hx of  EPIDERMAL INCLUSION CYST vs HS vs Folliculitis  The patient has spots, moles and lesions to be evaluated, some may be new or changing and the patient may have concern these could be cancer.   The following portions of the chart were reviewed this encounter and updated as appropriate: medications, allergies, medical history  Review of Systems:  No other skin or systemic complaints except as noted in HPI or Assessment and Plan.  Objective  Well appearing patient in no apparent distress; mood and affect are within normal limits.    A focused examination was performed of the following areas: Left breast , chest, abdomen  Relevant exam findings are noted in the Assessment and Plan.    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 Avoid applying to face, groin, and axilla. Use as directed. Long-term use can cause thinning of the skin.  No treatment for now   Topical steroids (such as triamcinolone, fluocinolone, fluocinonide, mometasone, clobetasol, halobetasol, betamethasone, hydrocortisone) can cause thinning and lightening of the skin if they are used for too long in the same area. Your physician has selected the right strength medicine for your problem and area affected on the body. Please use your medication  only as directed by your physician to prevent side effects.      EPIDERMAL INCLUSION CYST vs HS vs Folliculitis Exam: postinflammatory erythema from prior lesions   Benign-appearing. Exam most consistent with an epidermal inclusion cyst. Discussed that a cyst is a benign growth that can grow over time and sometimes get irritated or inflamed. Recommend observation if it is not bothersome. Discussed option of surgical excision to remove it if it is growing, symptomatic, or other changes noted. Please call for new or changing lesions so they can be evaluated.   Recommend HibiClens daily to wash affected areas.  Continue doxycycline monohydrate 100 mg twice daily with food until finishing prescription.    Doxycycline should be taken with food to prevent nausea. Do not lay down for 30 minutes after taking. Be cautious with sun exposure and use good sun protection while on this medication. Pregnant women should not take this medication.      Return if symptoms worsen or fail to improve.  I, Asher Muir, CMA, am acting as scribe for Elie Goody, MD.   Documentation: I have reviewed the above documentation for accuracy and completeness, and I agree with the above.  Elie Goody, MD

## 2023-05-31 NOTE — Patient Instructions (Signed)

## 2023-06-19 DIAGNOSIS — F5222 Female sexual arousal disorder: Secondary | ICD-10-CM | POA: Diagnosis not present

## 2023-06-19 DIAGNOSIS — E669 Obesity, unspecified: Secondary | ICD-10-CM | POA: Diagnosis not present

## 2023-06-20 ENCOUNTER — Other Ambulatory Visit: Payer: Self-pay | Admitting: Medical Genetics

## 2023-06-22 DIAGNOSIS — F5222 Female sexual arousal disorder: Secondary | ICD-10-CM | POA: Diagnosis not present

## 2023-06-22 DIAGNOSIS — E669 Obesity, unspecified: Secondary | ICD-10-CM | POA: Diagnosis not present

## 2023-07-04 ENCOUNTER — Telehealth (INDEPENDENT_AMBULATORY_CARE_PROVIDER_SITE_OTHER): Payer: BC Managed Care – PPO | Admitting: Family Medicine

## 2023-07-04 ENCOUNTER — Encounter: Payer: Self-pay | Admitting: Family Medicine

## 2023-07-04 ENCOUNTER — Telehealth: Payer: BC Managed Care – PPO

## 2023-07-04 VITALS — Ht 63.0 in | Wt 209.0 lb

## 2023-07-04 DIAGNOSIS — J069 Acute upper respiratory infection, unspecified: Secondary | ICD-10-CM | POA: Diagnosis not present

## 2023-07-04 NOTE — Progress Notes (Signed)
Angela Mccormick [facility] [address] [place] [address] Dept: [phone] Dept Fax: [phone]  Virtual Video Visit  I connected with Cannon Kettle on 07/04/23 at  4:00 PM EST by a video enabled telemedicine application and verified that I am speaking with the correct person using two identifiers.  Location patient: Home Location provider: Clinic Persons participating in the virtual visit: Patient, Provider  I discussed the limitations of evaluation and management by telemedicine and the availability of in person appointments. The patient expressed understanding and agreed to proceed.  Chief Complaint  Patient presents with   Sinus Problem    C/o having sinus drainage, cough x 1 day.   No TOC meds taken.    SUBJECTIVE:  HPI: Angela Mccormick is a 38 y.o. female who presents with postnasal drip since this morning. She feels this has been worsening as the day goes on. She denies fever, but has chills. She has had some non-productive cough. She had some nausea and vomited once this morning. She denies diarrhea. She has not taken any medication for this. She notes her son was sick over the weekend. He was seen at [facility] and tested neg. for influenza, COVID and RSV. He was diagnosed with acute bronchitis.  Patient Active Problem List   Diagnosis Date Noted   Viral URI 07/04/2023   Subcutaneous cyst 02/06/2023   Genetic testing 03/07/2022   Family history of cancer 02/08/2022   Ear itching 09/27/2021   Urine frequency 04/05/2021   Right knee pain 01/10/2021   Low libido 01/10/2021   Hypersomnia 10/08/2020   Abnormal menses 07/01/2020   Congestion of nasal sinus 04/19/2020   Rash 04/19/2020   Routine general medical examination at a health care facility 03/05/2020   Anxiety and depression 03/05/2020   Obesity (BMI 30.0-34.9) 03/05/2020   Asthma 12/08/2019   Dizziness 09/08/2019   Rectal bleeding 03/04/2018    Nodule of neck 11/01/2016   Chiari malformation (HCC) 09/26/2012   Migraines 09/26/2012   Past Surgical History:  Procedure Laterality Date   CESAREAN SECTION N/A 12/17/2017   Procedure: CESAREAN SECTION;  Surgeon: Vena Austria, MD;  Location: [facility] ORS;  Service: Obstetrics;  Laterality: N/A;   CESAREAN SECTION WITH BILATERAL TUBAL LIGATION  04/06/2019   Procedure: CESAREAN SECTION WITH BILATERAL TUBAL LIGATION;  Surgeon: Vena Austria, MD;  Location: [facility] OR[facility];  Service: Obstetrics;;   FLEXIBLE SIGMOIDOSCOPY N/A 03/12/2018   Procedure: FLEXIBLE SIGMOIDOSCOPY;  Surgeon: Toney Reil, MD;  Location: [facility];  Service: Gastroenterology;  Laterality: N/A;   NO PAST SURGERIES     WISDOM TOOTH EXTRACTION     Family History  Problem Relation Age of Onset   Diabetes Mother    Stroke Mother    Cancer Mother        possible colon cancer   Diabetes Father    Brain cancer Father 57   Stroke Maternal Aunt    Brain cancer Maternal Aunt        x2; d. 31s   Colon cancer Maternal Grandmother        dx 37s   Stroke Maternal Grandfather    Heart disease Maternal Grandfather        CABG   Prostate cancer Maternal Grandfather        92s   Cancer Paternal Grandmother        bone cancer, d. 43s   Breast cancer Other    Breast cancer Paternal Great-grandmother  both PGGMs   Chiari malformation Son    Social History   Tobacco Use   Smoking status: Never   Smokeless tobacco: Never  Vaping Use   Vaping status: Never Used  Substance Use Topics   Alcohol use: Not Currently    Comment: 1 drink/year   Drug use: No    Current Outpatient Medications:    albuterol (VENTOLIN HFA) 108 (90 Base) MCG/ACT inhaler, INHALE 2 PUFFS BY MOUTH EVERY 6 HOURS AS NEEDED FOR WHEEZING FOR SHORTNESS OF BREATH, Disp: 9 g, Rfl: 3   buPROPion (WELLBUTRIN XL) 300 MG 24 hr tablet, Take 1 tablet (300 mg total) by mouth daily after lunch., Disp: 90 tablet, Rfl: 1   clobetasol cream  (TEMOVATE) 0.05 %, Apply 1 Application topically 2 (two) times daily. Apply 0.5 grams twice a day to affected areas of rash on breast until smooth, then stop. Avoid applying to face, groin, and axilla. Use as directed. Long-term use can cause thinning of the skin. Use 1 gram daily, Disp: 30 g, Rfl: 0   Multiple Vitamins-Minerals (EQ MULTIVITAMINS ADULT GUMMY PO), Take one gummy by mouth daily., Disp: , Rfl:    nystatin (MYCOSTATIN/NYSTOP) powder, Apply 1 Application topically 3 (three) times daily., Disp: 15 g, Rfl: 0   phentermine (ADIPEX-P) 37.5 MG tablet, Take by mouth., Disp: , Rfl:    testosterone cypionate (DEPOTESTOSTERONE CYPIONATE) 200 MG/ML injection, Inject into the muscle., Disp: , Rfl:  Allergies  Allergen Reactions   Sumatriptan Succinate Anxiety   Other     dissolvable staples - body rejects   ROS: See pertinent positives and negatives per HPI.  OBSERVATIONS/OBJECTIVE:  VITALS per patient if applicable: Today's Vitals   07/04/23 1559  Weight: 209 lb (94.8 kg)  Height: 5\' 3"  (1.6 m)   Body mass index is 37.02 kg/m.   GENERAL: Alert and oriented. Appears well and in no acute distress. HEENT: Atraumatic. Eyes clear. No obvious abnormalities on inspection of external nose and ears. NECK: Normal movements of the head and neck. LUNGS: On inspection, no signs of respiratory distress. Breathing rate appears normal. No obvious gross SOB, gasping or wheezing, and no conversational dyspnea. CV: No obvious cyanosis. PSYCH/NEURO: Pleasant and cooperative. No obvious depression or anxiety. Speech and thought processing grossly intact.  ASSESSMENT AND PLAN:  Problem List Items Addressed This Visit       Respiratory   Viral URI - Primary   Discussed home care for viral illness, including rest, pushing fluids, and OTC medications as needed for symptom relief. Discussed her using an antihistamine (such as Benadryl), Tylenol for chills, and a nasal saline flush to reduce drainage.  Follow-up if needed for worsening or persistent symptoms.         I discussed the assessment and treatment plan with the patient. The patient was provided an opportunity to ask questions and all were answered. The patient agreed with the plan and demonstrated an understanding of the instructions.   The patient was advised to call back or seek an in-person evaluation if the symptoms worsen or if the condition fails to improve as anticipated.  Return if symptoms worsen or fail to improve.   Loyola Mast, MD

## 2023-07-04 NOTE — Assessment & Plan Note (Signed)
Discussed home care for viral illness, including rest, pushing fluids, and OTC medications as needed for symptom relief. Discussed her using an antihistamine (such as Benadryl), Tylenol for chills, and a nasal saline flush to reduce drainage. Follow-up if needed for worsening or persistent symptoms.

## 2023-07-06 ENCOUNTER — Ambulatory Visit
Admission: EM | Admit: 2023-07-06 | Discharge: 2023-07-06 | Disposition: A | Discharge status: Home / Self Care | Payer: 59 | Attending: Emergency Medicine | Admitting: Emergency Medicine

## 2023-07-06 DIAGNOSIS — J45901 Unspecified asthma with (acute) exacerbation: Secondary | ICD-10-CM | POA: Diagnosis not present

## 2023-07-06 DIAGNOSIS — J069 Acute upper respiratory infection, unspecified: Secondary | ICD-10-CM

## 2023-07-06 MED ORDER — PREDNISONE 10 MG PO TABS: 40.0000 mg | ORAL_TABLET | Freq: Every day | ORAL | 0 refills | Status: AC

## 2023-07-06 MED ORDER — ALBUTEROL SULFATE HFA 108 (90 BASE) MCG/ACT IN AERS: 1.0000 | INHALATION_SPRAY | Freq: Four times a day (QID) | RESPIRATORY_TRACT | 0 refills | Status: AC | PRN

## 2023-07-06 MED ORDER — BENZONATATE 100 MG PO CAPS: 100.0000 mg | ORAL_CAPSULE | Freq: Three times a day (TID) | ORAL | 0 refills | Status: DC | PRN

## 2023-07-06 NOTE — ED Triage Notes (Signed)
Pt states that her back and chest hurts when she coughs.

## 2023-07-06 NOTE — ED Triage Notes (Signed)
Pt states that she has some bilateral ear pain. X2 weeks  Pt states that she has a cough, nasal congestion and sore throat. X2 days

## 2023-07-06 NOTE — Discharge Instructions (Addendum)
Use the albuterol inhaler as directed.  Take the prednisone and Tessalon Perles as directed.  Follow-up with your primary care provider.

## 2023-07-06 NOTE — ED Provider Notes (Signed)
Angela Mccormick    CSN: [id number] Arrival date & time: 07/06/23  1216      History   Chief Complaint Chief Complaint  Patient presents with   Cough    Cough, nasal congestion and sore throat    HPI Angela Mccormick is a 38 y.o. female.  Patient presents with intermittent ear pain x 2 weeks.  She reports 2-day history of congestion, sore throat, cough.  She has pain in her chest and back when she coughs.  No OTC medication taken today.  No fever, shortness of breath, vomiting, diarrhea.  Patient had a e-visit on 07/04/2023; diagnosed with viral URI; treated symptomatically.  Her medical history includes asthma.  Her albuterol inhaler is expired.  The history is provided by the patient and medical records.    Past Medical History:  Diagnosis Date   Asthma    Depression    Gestational diabetes    History of Chiari malformation    History of syncope    Migraine     Patient Active Problem List   Diagnosis Date Noted   Viral URI 07/04/2023   Subcutaneous cyst 02/06/2023   Genetic testing 03/07/2022   Family history of cancer 02/08/2022   Ear itching 09/27/2021   Urine frequency 04/05/2021   Right knee pain 01/10/2021   Low libido 01/10/2021   Hypersomnia 10/08/2020   Abnormal menses 07/01/2020   Congestion of nasal sinus 04/19/2020   Rash 04/19/2020   Routine general medical examination at a health care facility 03/05/2020   Anxiety and depression 03/05/2020   Obesity (BMI 30.0-34.9) 03/05/2020   Asthma 12/08/2019   Dizziness 09/08/2019   Rectal bleeding 03/04/2018   Nodule of neck 11/01/2016   Chiari malformation (HCC) 09/26/2012   Migraines 09/26/2012    Past Surgical History:  Procedure Laterality Date   CESAREAN SECTION N/A 12/17/2017   Procedure: CESAREAN SECTION;  Surgeon: Vena Austria, MD;  Location: [facility] ORS;  Service: Obstetrics;  Laterality: N/A;   CESAREAN SECTION WITH BILATERAL TUBAL LIGATION  04/06/2019   Procedure: CESAREAN SECTION WITH  BILATERAL TUBAL LIGATION;  Surgeon: Vena Austria, MD;  Location: [facility];  Service: Obstetrics;;   FLEXIBLE SIGMOIDOSCOPY N/A 03/12/2018   Procedure: FLEXIBLE SIGMOIDOSCOPY;  Surgeon: Toney Reil, MD;  Location: [facility];  Service: Gastroenterology;  Laterality: N/A;   NO PAST SURGERIES     WISDOM TOOTH EXTRACTION      OB History     Gravida  2   Para  2   Term  0   Preterm  2   AB      Living  2      SAB      IAB      Ectopic      Multiple  0   Live Births  2            Home Medications    Prior to Admission medications   Medication Sig Start Date End Date Taking? Authorizing Provider  albuterol (VENTOLIN HFA) 108 (90 Base) MCG/ACT inhaler Inhale 1-2 puffs into the lungs every 6 (six) hours as needed. 07/06/23  Yes Mickie Bail, NP  benzonatate (TESSALON) 100 MG capsule Take 1 capsule (100 mg total) by mouth 3 (three) times daily as needed for cough. 07/06/23  Yes Mickie Bail, NP  buPROPion (WELLBUTRIN XL) 300 MG 24 hr tablet Take 1 tablet (300 mg total) by mouth daily after lunch. 02/08/22  Yes Glori Luis, MD  clobetasol  cream (TEMOVATE) 0.05 % Apply 1 Application topically 2 (two) times daily. Apply 0.5 grams twice a day to affected areas of rash on breast until smooth, then stop. Avoid applying to face, groin, and axilla. Use as directed. Long-term use can cause thinning of the skin. Use 1 gram daily 04/04/23  Yes Elie Goody, MD  Multiple Vitamins-Minerals (EQ MULTIVITAMINS ADULT GUMMY PO) Take one gummy by mouth daily.   Yes [provider]  nystatin (MYCOSTATIN/NYSTOP) powder Apply 1 Application topically 3 (three) times daily. 03/24/23  Yes Worthy Rancher B, FNP  phentermine (ADIPEX-P) 37.5 MG tablet Take by mouth. 06/19/23 07/19/23 Yes [provider]  predniSONE (DELTASONE) 10 MG tablet Take 4 tablets (40 mg total) by mouth daily for 5 days. 07/06/23 07/11/23 Yes Mickie Bail, NP  testosterone cypionate  (DEPOTESTOSTERONE CYPIONATE) 200 MG/ML injection Inject into the muscle. 12/02/20  Yes [provider]    Family History Family History  Problem Relation Age of Onset   Diabetes Mother    Stroke Mother    Cancer Mother        possible colon cancer   Diabetes Father    Brain cancer Father 69   Stroke Maternal Aunt    Brain cancer Maternal Aunt        x2; d. 16s   Colon cancer Maternal Grandmother        dx 28s   Stroke Maternal Grandfather    Heart disease Maternal Grandfather        CABG   Prostate cancer Maternal Grandfather        91s   Cancer Paternal Grandmother        bone cancer, d. 18s   Breast cancer Other    Breast cancer Paternal Great-grandmother        both PGGMs   Chiari malformation Son     Social History Social History   Tobacco Use   Smoking status: Never   Smokeless tobacco: Never  Vaping Use   Vaping status: Never Used  Substance Use Topics   Alcohol use: Not Currently    Comment: 1 drink/year   Drug use: No     Allergies   Sumatriptan succinate and Other   Review of Systems Review of Systems  Constitutional:  Negative for chills and fever.  HENT:  Positive for congestion, ear pain and sore throat.   Respiratory:  Positive for cough. Negative for shortness of breath.   Gastrointestinal:  Negative for diarrhea and vomiting.     Physical Exam Triage Vital Signs ED Triage Vitals  Encounter Vitals Group     BP 07/06/23 1253 (!) 122/90     Systolic BP Percentile --      Diastolic BP Percentile --      Pulse Rate 07/06/23 1253 82     Resp 07/06/23 1253 17     Temp 07/06/23 1253 98.6 F (37 C)     Temp Source 07/06/23 1253 Temporal     SpO2 07/06/23 1253 98 %     Weight 07/06/23 1251 209 lb (94.8 kg)     Height 07/06/23 1251 5\' 3"  (1.6 m)     Head Circumference --      Peak Flow --      Pain Score 07/06/23 1250 5     Pain Loc --      Pain Education --      Exclude from Growth Chart --    No data found.  Updated Vital  Signs BP Marland Kitchen)  122/90 (BP Location: Left Arm)   Pulse 82   Temp 98.6 F (37 C) (Temporal)   Resp 17   Ht 5\' 3"  (1.6 m)   Wt 209 lb (94.8 kg)   LMP 06/04/2023   SpO2 98%   BMI 37.02 kg/m   Visual Acuity Right Eye Distance:   Left Eye Distance:   Bilateral Distance:    Right Eye Near:   Left Eye Near:    Bilateral Near:     Physical Exam Constitutional:      General: She is not in acute distress. HENT:     Right Ear: Tympanic membrane normal.     Left Ear: Tympanic membrane normal.     Nose: Nose normal.     Mouth/Throat:     Mouth: Mucous membranes are moist.     Pharynx: Oropharynx is clear.  Cardiovascular:     Rate and Rhythm: Normal rate and regular rhythm.     Heart sounds: Normal heart sounds.  Pulmonary:     Effort: Pulmonary effort is normal. No respiratory distress.     Breath sounds: Normal breath sounds. No wheezing.  Neurological:     Mental Status: She is alert.      UC Treatments / Results  Labs (all labs ordered are listed, but only abnormal results are displayed) Labs Reviewed - No data to display  EKG   Radiology No results found.  Procedures Procedures (including critical care time)  Medications Ordered in UC Medications - No data to display  Initial Impression / Assessment and Plan / UC Course  I have reviewed the triage vital signs and the nursing notes.  Pertinent labs & imaging results that were available during my care of the patient were reviewed by me and considered in my medical decision making (see chart for details).    Asthma exacerbation, viral URI.  Lungs are clear and O2 sat is 98% on room air.  Patient declines flu or COVID test.  Treating today with Tessalon Perles, prednisone, albuterol inhaler.  Instructed her to follow-up with her PCP.  ED precautions given.  Education provided on viral respiratory infection and asthma.  She agrees to plan of care.  Final Clinical Impressions(s) / UC Diagnoses   Final diagnoses:   Viral URI  Asthma with acute exacerbation, unspecified asthma severity, unspecified whether persistent     Discharge Instructions      Use the albuterol inhaler as directed.  Take the prednisone and Tessalon Perles as directed.  Follow-up with your primary care provider.     ED Prescriptions     Medication Sig Dispense Auth. Provider   benzonatate (TESSALON) 100 MG capsule Take 1 capsule (100 mg total) by mouth 3 (three) times daily as needed for cough. 21 capsule Mickie Bail, NP   albuterol (VENTOLIN HFA) 108 (90 Base) MCG/ACT inhaler Inhale 1-2 puffs into the lungs every 6 (six) hours as needed. 18 g Mickie Bail, NP   predniSONE (DELTASONE) 10 MG tablet Take 4 tablets (40 mg total) by mouth daily for 5 days. 20 tablet Mickie Bail, NP      PDMP not reviewed this encounter.   Mickie Bail, NP 07/06/23 1332

## 2023-07-09 ENCOUNTER — Other Ambulatory Visit: Payer: Self-pay

## 2023-07-11 ENCOUNTER — Other Ambulatory Visit: Payer: Self-pay | Attending: Medical Genetics

## 2023-10-29 ENCOUNTER — Other Ambulatory Visit: Payer: Self-pay | Admitting: Family

## 2023-10-29 DIAGNOSIS — R21 Rash and other nonspecific skin eruption: Secondary | ICD-10-CM

## 2023-11-09 DIAGNOSIS — F5222 Female sexual arousal disorder: Secondary | ICD-10-CM | POA: Diagnosis not present

## 2023-11-20 ENCOUNTER — Encounter: Payer: Self-pay | Admitting: Nurse Practitioner

## 2023-11-20 ENCOUNTER — Ambulatory Visit (INDEPENDENT_AMBULATORY_CARE_PROVIDER_SITE_OTHER): Payer: BC Managed Care – PPO | Admitting: Nurse Practitioner

## 2023-11-20 VITALS — BP 118/76 | HR 75 | Temp 98.1°F | Ht 63.0 in | Wt 209.2 lb

## 2023-11-20 DIAGNOSIS — R21 Rash and other nonspecific skin eruption: Secondary | ICD-10-CM

## 2023-11-20 DIAGNOSIS — F32A Depression, unspecified: Secondary | ICD-10-CM

## 2023-11-20 DIAGNOSIS — Z0000 Encounter for general adult medical examination without abnormal findings: Secondary | ICD-10-CM | POA: Diagnosis not present

## 2023-11-20 DIAGNOSIS — G43809 Other migraine, not intractable, without status migrainosus: Secondary | ICD-10-CM | POA: Diagnosis not present

## 2023-11-20 DIAGNOSIS — F419 Anxiety disorder, unspecified: Secondary | ICD-10-CM | POA: Diagnosis not present

## 2023-11-20 DIAGNOSIS — J4520 Mild intermittent asthma, uncomplicated: Secondary | ICD-10-CM | POA: Diagnosis not present

## 2023-11-20 MED ORDER — BUPROPION HCL ER (XL) 300 MG PO TB24: 300.0000 mg | ORAL_TABLET | Freq: Every day | ORAL | 3 refills | Status: AC

## 2023-11-20 MED ORDER — NYSTATIN 100000 UNIT/GM EX POWD
1.0000 | Freq: Three times a day (TID) | CUTANEOUS | 2 refills | Status: AC
Start: 2023-11-20 — End: ?

## 2023-11-20 NOTE — Progress Notes (Signed)
 Leron Glance, NP-C Phone: [phone]  Angela Mccormick is a 38 y.o. female who presents today for transfer of care.   Discussed the use of AI scribe software for clinical note transcription with the patient, who gave verbal consent to proceed.  History of Present Illness   Angela Mccormick is a 38 year old female who presents for a transfer of care and annual exam.  She is currently taking Wellbutrin  for mood management and phentermine for weight loss, both prescribed by her OBGYN. Additionally, she receives testosterone  treatment from her OBGYN.  For asthma management, she uses albuterol  as needed, primarily triggered by weather changes and colds, but does not require daily use of her inhaler.  Regarding her migraines, she is under the care of neurology at [facility]. She takes naproxen 500 mg and metoclopramide for migraine management, limited to twice a week. She also takes topiramate at night. She currently has a migraine but cannot take her medication due to dosing restrictions. She is unable to identify specific migraine triggers.  She uses nystatin  powder for a yeast infection in her belly button and under her belly, which alleviates itching and redness. She requests a refill for this medication.  Her menstrual cycle is regular, lasting two to three days with heavy flow. She is not on any medication for menstrual management.  Socially, she consumes alcohol occasionally, approximately one drink every three months, and does not smoke or use drugs. She works at a urology clinic and exercises by walking and going to the gym about twice a week. Her diet is generally good, with occasional snacking at work.  No chest pain, shortness of breath, gastrointestinal issues, urinary problems, abnormal discharge, painful intercourse, dizziness, swelling in the legs, joint pain, or significant mood disturbances. She reports eczema and variable sleep patterns, sometimes waking up at 3 or 4 AM and having  difficulty returning to sleep.      Social History   Tobacco Use  Smoking Status Never  Smokeless Tobacco Never    Current Outpatient Medications on File Prior to Visit  Medication Sig Dispense Refill   albuterol  (VENTOLIN  HFA) 108 (90 Base) MCG/ACT inhaler Inhale 1-2 puffs into the lungs every 6 (six) hours as needed. 18 g 0   clobetasol  cream (TEMOVATE ) 0.05 % Apply 1 Application topically 2 (two) times daily. Apply 0.5 grams twice a day to affected areas of rash on breast until smooth, then stop. Avoid applying to face, groin, and axilla. Use as directed. Long-term use can cause thinning of the skin. Use 1 gram daily 30 g 0   phentermine (ADIPEX-P) 37.5 MG tablet Take by mouth.     testosterone  cypionate (DEPOTESTOSTERONE CYPIONATE) 200 MG/ML injection Inject into the muscle.     No current facility-administered medications on file prior to visit.     ROS see history of present illness  Objective  Physical Exam Vitals:   11/20/23 1354  BP: 118/76  Pulse: 75  Temp: 98.1 F (36.7 C)  SpO2: 99%    BP Readings from Last 3 Encounters:  11/20/23 118/76  07/06/23 (!) 122/90  05/23/23 116/60   Wt Readings from Last 3 Encounters:  11/20/23 209 lb 3.2 oz (94.9 kg)  07/06/23 209 lb (94.8 kg)  07/04/23 209 lb (94.8 kg)    Physical Exam Constitutional:      General: She is not in acute distress.    Appearance: Normal appearance. She is obese.  HENT:     Head: Normocephalic.  Right Ear: Tympanic membrane normal.     Left Ear: Tympanic membrane normal.     Nose: Nose normal.     Mouth/Throat:     Mouth: Mucous membranes are moist.     Pharynx: Oropharynx is clear.  Eyes:     Conjunctiva/sclera: Conjunctivae normal.     Pupils: Pupils are equal, round, and reactive to light.  Neck:     Thyroid : No thyromegaly.  Cardiovascular:     Rate and Rhythm: Normal rate and regular rhythm.     Heart sounds: Normal heart sounds.  Pulmonary:     Effort: Pulmonary effort is  normal.     Breath sounds: Normal breath sounds.  Abdominal:     General: Abdomen is flat. Bowel sounds are normal.     Palpations: Abdomen is soft. There is no mass.     Tenderness: There is no abdominal tenderness.  Musculoskeletal:        General: Normal range of motion.  Lymphadenopathy:     Cervical: No cervical adenopathy.  Skin:    General: Skin is warm and dry.     Findings: No rash.  Neurological:     General: No focal deficit present.     Mental Status: She is alert.  Psychiatric:        Mood and Affect: Mood normal.        Behavior: Behavior normal.      Assessment/Plan: Please see individual problem list.  Routine general medical examination at a health care facility Assessment & Plan: Physical exam complete. Lab work is up to date. Pap smear is up to date. Flu and tetanus vaccines are up to date. She has received 3 COVID vaccines and declines additional. Continue routine dental and eye exams. Encourage healthy diet and regular exercise. Return to care in one year, sooner as needed.   Anxiety and depression Assessment & Plan: Symptoms are well-controlled with Wellbutrin . Continue Wellbutrin . Encouraged to contact if worsening symptoms, unusual behavior changes or suicidal thoughts occur.    Mild intermittent asthma without complication Assessment & Plan: Asthma is well-controlled with as-needed albuterol . Triggers include weather changes and colds. Continue as-needed albuterol .   Other migraine without status migrainosus, not intractable Assessment & Plan: Chronic migraines are managed by neurology. The current episode is untreated due to medication dosing limits, with no clear triggers identified. Continue topamax, naproxen and metoclopramide as prescribed by neurology.   Rash Assessment & Plan: Recurrent yeast infections are managed with nystatin  powder. Symptoms include redness and itchiness. Refill nystatin  powder for topical use as needed.    Orders: -     Nystatin ; Apply 1 Application topically 3 (three) times daily.  Dispense: 60 g; Refill: 2    Return in about 1 year (around 11/19/2024) for Annual Exam, sooner as needed.   Leron Glance, NP-C [facility]

## 2023-11-26 ENCOUNTER — Encounter: Payer: Self-pay | Admitting: Nurse Practitioner

## 2023-11-26 NOTE — Assessment & Plan Note (Signed)
 Symptoms are well-controlled with Wellbutrin . Continue Wellbutrin . Encouraged to contact if worsening symptoms, unusual behavior changes or suicidal thoughts occur.

## 2023-11-26 NOTE — Assessment & Plan Note (Signed)
 Asthma is well-controlled with as-needed albuterol . Triggers include weather changes and colds. Continue as-needed albuterol .

## 2023-11-26 NOTE — Assessment & Plan Note (Signed)
 Chronic migraines are managed by neurology. The current episode is untreated due to medication dosing limits, with no clear triggers identified. Continue topamax, naproxen and metoclopramide as prescribed by neurology.

## 2023-11-26 NOTE — Assessment & Plan Note (Signed)
 Recurrent yeast infections are managed with nystatin  powder. Symptoms include redness and itchiness. Refill nystatin  powder for topical use as needed.

## 2023-11-26 NOTE — Assessment & Plan Note (Signed)
 Physical exam complete. Lab work is up to date. Pap smear is up to date. Flu and tetanus vaccines are up to date. She has received 3 COVID vaccines and declines additional. Continue routine dental and eye exams. Encourage healthy diet and regular exercise. Return to care in one year, sooner as needed.

## 2023-12-07 DIAGNOSIS — F5222 Female sexual arousal disorder: Secondary | ICD-10-CM | POA: Diagnosis not present

## 2023-12-11 DIAGNOSIS — F5222 Female sexual arousal disorder: Secondary | ICD-10-CM | POA: Diagnosis not present

## 2023-12-18 ENCOUNTER — Other Ambulatory Visit: Payer: Self-pay

## 2023-12-18 MED ORDER — TESTOSTERONE CYPIONATE 200 MG/ML IM SOLN
200.0000 mg | INTRAMUSCULAR | 0 refills | Status: DC
  Filled 2023-12-18: qty 1, 30d supply, fill #0

## 2023-12-26 ENCOUNTER — Other Ambulatory Visit: Payer: Self-pay

## 2024-01-01 DIAGNOSIS — F5222 Female sexual arousal disorder: Secondary | ICD-10-CM | POA: Diagnosis not present

## 2024-02-15 ENCOUNTER — Encounter: Payer: Self-pay | Admitting: Neurology

## 2024-02-28 ENCOUNTER — Other Ambulatory Visit: Payer: Self-pay | Admitting: Medical Genetics

## 2024-02-28 DIAGNOSIS — Z006 Encounter for examination for normal comparison and control in clinical research program: Secondary | ICD-10-CM

## 2024-03-02 ENCOUNTER — Telehealth: Admitting: Family

## 2024-03-02 DIAGNOSIS — J0100 Acute maxillary sinusitis, unspecified: Secondary | ICD-10-CM | POA: Diagnosis not present

## 2024-03-02 MED ORDER — AMOXICILLIN-POT CLAVULANATE 875-125 MG PO TABS
1.0000 | ORAL_TABLET | Freq: Two times a day (BID) | ORAL | 0 refills | Status: DC
  Filled 2024-03-02: qty 14, 7d supply, fill #0

## 2024-03-02 NOTE — Patient Instructions (Signed)

## 2024-03-02 NOTE — Progress Notes (Signed)
 Virtual Visit Consent   Angela Mccormick, you are scheduled for a virtual visit with a [facility] provider today. Just as with appointments in the office, your consent must be obtained to participate. Your consent will be active for this visit and any virtual visit you may have with one of our providers in the next 365 days. If you have a MyChart account, a copy of this consent can be sent to you electronically.  As this is a virtual visit, video technology does not allow for your provider to perform a traditional examination. This may limit your provider's ability to fully assess your condition. If your provider identifies any concerns that need to be evaluated in person or the need to arrange testing (such as labs, EKG, etc.), we will make arrangements to do so. Although advances in technology are sophisticated, we cannot ensure that it will always work on either your end or our end. If the connection with a video visit is poor, the visit may have to be switched to a telephone visit. With either a video or telephone visit, we are not always able to ensure that we have a secure connection.  By engaging in this virtual visit, you consent to the provision of healthcare and authorize for your insurance to be billed (if applicable) for the services provided during this visit. Depending on your insurance coverage, you may receive a charge related to this service.  I need to obtain your verbal consent now. Are you willing to proceed with your visit today? Angela Mccormick has provided verbal consent on 03/02/2024 for a virtual visit (video or telephone). Bari Learn, FNP  Date: 03/02/2024 7:30 PM   Virtual Visit via Video Note   I, Bari Learn, connected with  Angela Mccormick  ([id number], 06/30/1985) on 03/02/24 at  7:30 PM EDT by a video-enabled telemedicine application and verified that I am speaking with the correct person using two identifiers.  Location: Patient: Virtual Visit Location  Patient: Home Provider: Virtual Visit Location Provider: Home Office   I discussed the limitations of evaluation and management by telemedicine and the availability of in person appointments. The patient expressed understanding and agreed to proceed.    History of Present Illness: Angela Mccormick is a 38 y.o. who identifies as a female who was assigned female at birth, and is being seen today for sinus congestion and pain that started two weeks ago.  HPI: Sinusitis This is a new problem. The current episode started 1 to 4 weeks ago. The problem has been waxing and waning since onset. Her pain is at a severity of 5/10. The pain is moderate. Associated symptoms include congestion, ear pain, headaches, shortness of breath, sinus pressure and sneezing. Pertinent negatives include no coughing or sore throat. Treatments tried: albuterol . The treatment provided mild relief.    Problems:  Patient Active Problem List   Diagnosis Date Noted   Viral URI 07/04/2023   Subcutaneous cyst 02/06/2023   Genetic testing 03/07/2022   Family history of cancer 02/08/2022   Ear itching 09/27/2021   Urine frequency 04/05/2021   Right knee pain 01/10/2021   Low libido 01/10/2021   Hypersomnia 10/08/2020   Abnormal menses 07/01/2020   Congestion of nasal sinus 04/19/2020   Rash 04/19/2020   Routine general medical examination at a health care facility 03/05/2020   Anxiety and depression 03/05/2020   Obesity (BMI 30.0-34.9) 03/05/2020   Asthma 12/08/2019   Dizziness 09/08/2019   Rectal bleeding 03/04/2018  Nodule of neck 11/01/2016   Chiari malformation (HCC) 09/26/2012   Migraines 09/26/2012    Allergies:  Allergies  Allergen Reactions   Sumatriptan Succinate Anxiety   Other     dissolvable staples - body rejects   Medications:  Current Outpatient Medications:    amoxicillin -clavulanate (AUGMENTIN ) 875-125 MG tablet, Take 1 tablet by mouth 2 (two) times daily., Disp: 14 tablet, Rfl: 0    albuterol  (VENTOLIN  HFA) 108 (90 Base) MCG/ACT inhaler, Inhale 1-2 puffs into the lungs every 6 (six) hours as needed., Disp: 18 g, Rfl: 0   buPROPion  (WELLBUTRIN  XL) 300 MG 24 hr tablet, Take 1 tablet (300 mg total) by mouth daily after lunch., Disp: 90 tablet, Rfl: 3   clobetasol  cream (TEMOVATE ) 0.05 %, Apply 1 Application topically 2 (two) times daily. Apply 0.5 grams twice a day to affected areas of rash on breast until smooth, then stop. Avoid applying to face, groin, and axilla. Use as directed. Long-term use can cause thinning of the skin. Use 1 gram daily, Disp: 30 g, Rfl: 0   nystatin  (MYCOSTATIN /NYSTOP ) powder, Apply 1 Application topically 3 (three) times daily., Disp: 60 g, Rfl: 2   phentermine (ADIPEX-P) 37.5 MG tablet, Take by mouth., Disp: , Rfl:    testosterone  cypionate (DEPOTESTOSTERONE CYPIONATE) 200 MG/ML injection, Inject into the muscle., Disp: , Rfl:    testosterone  cypionate (DEPOTESTOSTERONE CYPIONATE) 200 MG/ML injection, Bring to office monthly for IM injection., Disp: 1 mL, Rfl: 0  Observations/Objective: Patient is well-developed, well-nourished in no acute distress.  Resting comfortably  at home.  Head is normocephalic, atraumatic.  No labored breathing.  Speech is clear and coherent with logical content.  Patient is alert and oriented at baseline.  Sinus pressure in maxillary sinuses   Assessment and Plan: 1. Acute maxillary sinusitis, recurrence not specified (Primary) - amoxicillin -clavulanate (AUGMENTIN ) 875-125 MG tablet; Take 1 tablet by mouth 2 (two) times daily.  Dispense: 14 tablet; Refill: 0  - Take meds as prescribed - Use a cool mist humidifier  -Use saline nose sprays frequently -Force fluids -For any cough or congestion  Use plain Mucinex- regular strength or max strength is fine -For fever or aces or pains- take tylenol  or ibuprofen . -Throat lozenges if help Work note given  -Follow up if symptoms worsen or do not improve   Follow Up  Instructions: I discussed the assessment and treatment plan with the patient. The patient was provided an opportunity to ask questions and all were answered. The patient agreed with the plan and demonstrated an understanding of the instructions.  A copy of instructions were sent to the patient via MyChart unless otherwise noted below.     The patient was advised to call back or seek an in-person evaluation if the symptoms worsen or if the condition fails to improve as anticipated.    Bari Learn, FNP

## 2024-03-03 ENCOUNTER — Other Ambulatory Visit: Payer: Self-pay

## 2024-03-11 NOTE — Addendum Note (Signed)
 Addended by: LAVELL LYE A on: 03/11/2024 03:52 PM   Modules accepted: Level of Service

## 2024-04-08 ENCOUNTER — Encounter: Payer: Self-pay | Admitting: Dermatology

## 2024-04-08 ENCOUNTER — Other Ambulatory Visit: Payer: Self-pay

## 2024-04-08 ENCOUNTER — Ambulatory Visit (INDEPENDENT_AMBULATORY_CARE_PROVIDER_SITE_OTHER): Admitting: Dermatology

## 2024-04-08 DIAGNOSIS — L720 Epidermal cyst: Secondary | ICD-10-CM

## 2024-04-08 DIAGNOSIS — L219 Seborrheic dermatitis, unspecified: Secondary | ICD-10-CM | POA: Diagnosis not present

## 2024-04-08 MED ORDER — HYDROCORTISONE 2.5 % EX CREA
TOPICAL_CREAM | CUTANEOUS | 2 refills | Status: AC
  Filled 2024-04-08: qty 30, 30d supply, fill #0
  Filled 2024-05-13: qty 30, 30d supply, fill #1

## 2024-04-08 MED ORDER — KETOCONAZOLE 2 % EX CREA
TOPICAL_CREAM | CUTANEOUS | 3 refills | Status: AC
  Filled 2024-04-08: qty 15, 30d supply, fill #0
  Filled 2024-05-13: qty 30, 30d supply, fill #1

## 2024-04-08 NOTE — Progress Notes (Unsigned)
   Follow-Up Visit   Subjective  Angela Mccormick is a 38 y.o. female who presents for the following: red spots and dry skin on forehead. Happens randomly. Itches. Using moisturizer, helps a little but comes back.    The following portions of the chart were reviewed this encounter and updated as appropriate: medications, allergies, medical history  Review of Systems:  No other skin or systemic complaints except as noted in HPI or Assessment and Plan.  Objective  Well appearing patient in no apparent distress; mood and affect are within normal limits.  A focused examination was performed of the following areas: Face   Relevant exam findings are noted in the Assessment and Plan.    Assessment & Plan   SEBORRHEIC DERMATITIS Exam: Pink scaly patches scale at forehead glabella. Fine scale in scalp  Chronic and persistent condition with duration or expected duration over one year. Condition is bothersome/symptomatic for patient. Currently flared.  Possible atopic derm overlap  Seborrheic Dermatitis is a chronic persistent rash characterized by pinkness and scaling most commonly of the mid face but also can occur on the scalp (dandruff), ears; mid chest, mid back and groin.  It tends to be exacerbated by stress and cooler weather.  People who have neurologic disease may experience new onset or exacerbation of existing seborrheic dermatitis.  The condition is not curable but treatable and can be controlled.  Treatment Plan:  Apply ketoconazole  cream once to twice a day as needed  Apply hydrocortisone  2.5%% cream once or twice a day as needed to affected areas for up to 2 weeks. Use as directed. Long-term use can cause thinning of the skin.  Topical steroids (such as triamcinolone , fluocinolone, fluocinonide, mometasone, clobetasol , halobetasol, betamethasone , hydrocortisone ) can cause thinning and lightening of the skin if they are used for too long in the same area. Your physician has  selected the right strength medicine for your problem and area affected on the body. Please use your medication only as directed by your physician to prevent side effects.    EPIDERMAL INCLUSION CYST, recurrent, inflamed Exam: Subcutaneous nodule at left abdomen  Benign-appearing. Exam most consistent with an epidermal inclusion cyst. Discussed that a cyst is a benign growth that can grow over time and sometimes get irritated or inflamed. Recommend observation if it is not bothersome. Discussed option of surgical excision to remove it if it is growing, symptomatic, or other changes noted. Please call for new or changing lesions so they can be evaluated.  Can apply Hydrocortisone  2.5% cream twice a day as needed for irritation  SEBORRHEIC DERMATITIS   EIC (EPIDERMAL INCLUSION CYST)    Return if symptoms worsen or fail to improve.  I, Jill Parcell, CMA, am acting as scribe for Boneta Sharps, MD.   Documentation: I have reviewed the above documentation for accuracy and completeness, and I agree with the above.  Boneta Sharps, MD

## 2024-04-08 NOTE — Patient Instructions (Signed)
 Apply ketoconazole  cream once to twice a day as needed  Apply hydrocortisone  2.5%% cream once or twice a day as needed to affected areas for up to 2 weeks. Use as directed. Long-term use can cause thinning of the skin.  Topical steroids (such as triamcinolone , fluocinolone, fluocinonide, mometasone, clobetasol , halobetasol, betamethasone , hydrocortisone ) can cause thinning and lightening of the skin if they are used for too long in the same area. Your physician has selected the right strength medicine for your problem and area affected on the body. Please use your medication only as directed by your physician to prevent side effects.      Due to recent changes in healthcare laws, you may see results of your pathology and/or laboratory studies on MyChart before the doctors have had a chance to review them. We understand that in some cases there may be results that are confusing or concerning to you. Please understand that not all results are received at the same time and often the doctors may need to interpret multiple results in order to provide you with the best plan of care or course of treatment. Therefore, we ask that you please give us  2 business days to thoroughly review all your results before contacting the office for clarification. Should we see a critical lab result, you will be contacted sooner.   If You Need Anything After Your Visit  If you have any questions or concerns for your doctor, please call our main line at [phone] and press option 4 to reach your doctor's medical assistant. If no one answers, please leave a voicemail as directed and we will return your call as soon as possible. Messages left after 4 pm will be answered the following business day.   You may also send us  a message via MyChart. We typically respond to MyChart messages within 1-2 business days.  For prescription refills, please ask your pharmacy to contact our office. Our fax number is [phone].  If  you have an urgent issue when the clinic is closed that cannot wait until the next business day, you can page your doctor at the number below.    Please note that while we do our best to be available for urgent issues outside of office hours, we are not available 24/7.   If you have an urgent issue and are unable to reach us , you may choose to seek medical care at your doctor's office, retail clinic, urgent care center, or emergency room.  If you have a medical emergency, please immediately call 911 or go to the emergency department.  Pager Numbers  - Dr. Hester: [phone]  - Dr. Jackquline: [phone]  - Dr. Claudene: [phone]   - Dr. Raymund: [phone]  In the event of inclement weather, please call our main line at [phone] for an update on the status of any delays or closures.  Dermatology Medication Tips: Please keep the boxes that topical medications come in in order to help keep track of the instructions about where and how to use these. Pharmacies typically print the medication instructions only on the boxes and not directly on the medication tubes.   If your medication is too expensive, please contact our office at [phone] option 4 or send us  a message through MyChart.   We are unable to tell what your co-pay for medications will be in advance as this is different depending on your insurance coverage. However, we may be able to find a substitute medication at lower cost or fill out paperwork  to get insurance to cover a needed medication.   If a prior authorization is required to get your medication covered by your insurance company, please allow us  1-2 business days to complete this process.  Drug prices often vary depending on where the prescription is filled and some pharmacies may offer cheaper prices.  The website www.goodrx.com contains coupons for medications through different pharmacies. The prices here do not account for what the cost may be with help  from insurance (it may be cheaper with your insurance), but the website can give you the price if you did not use any insurance.  - You can print the associated coupon and take it with your prescription to the pharmacy.  - You may also stop by our office during regular business hours and pick up a GoodRx coupon card.  - If you need your prescription sent electronically to a different pharmacy, notify our office through [facility] or by phone at [phone] option 4.     Si Usted Necesita Algo Despus de Su Visita  Tambin puede enviarnos un mensaje a travs de Clinical Cytogeneticist. Por lo general respondemos a los mensajes de MyChart en el transcurso de 1 a 2 das hbiles.  Para renovar recetas, por favor pida a su farmacia que se ponga en contacto con nuestra oficina. Randi lakes de fax es [place] [phone].  Si tiene un asunto urgente cuando la clnica est cerrada y que no puede esperar hasta el siguiente da hbil, puede llamar/localizar a su doctor(a) al nmero que aparece a continuacin.   Por favor, tenga en cuenta que aunque hacemos todo lo posible para estar disponibles para asuntos urgentes fuera del horario de [place], no estamos disponibles las 24 horas del da, los 7 [address]  [address] de la [place].   Si tiene un problema urgente y no puede comunicarse con nosotros, puede optar por buscar atencin mdica  en el consultorio de su doctor(a), en una clnica privada, en un centro de atencin urgente o en una sala de emergencias.  Si tiene engineer, drilling, por favor llame inmediatamente al 911 o vaya a la sala de emergencias.  Nmeros de bper  - Dr. Hester: [phone]  - Dra. Jackquline: [phone]  - Dr. Claudene: [phone]  - Dra. Kitts: [phone]  En caso de inclemencias del [place], por favor llame a nuestra lnea principal al [phone] para una actualizacin sobre el estado de cualquier retraso o cierre.  Consejos para la medicacin en dermatologa: Por favor, guarde  las cajas en las que vienen los medicamentos de uso tpico para ayudarle a seguir las instrucciones sobre dnde y cmo usarlos. Las farmacias generalmente imprimen las instrucciones del medicamento slo en las cajas y no directamente en los tubos del [place].   Si su medicamento es muy caro, por favor, pngase en contacto con landry rieger llamando al [phone] y presione la opcin 4 o envenos un mensaje a travs de Clinical Cytogeneticist.   No podemos decirle cul ser su copago por los medicamentos por adelantado ya que esto es diferente dependiendo de la cobertura de su seguro. Sin embargo, es posible que podamos encontrar un medicamento sustituto a audiological scientist un formulario para que el seguro cubra el medicamento que se considera necesario.   Si se requiere una autorizacin previa para que su compaa de seguros cubra su medicamento, por favor permtanos de 1 a 2 das hbiles para completar este proceso.  Los precios de los medicamentos varan con frecuencia dependiendo del environmental consultant de dnde se surte  la receta y alguna farmacias pueden ofrecer precios ms baratos.  El sitio web www.goodrx.com tiene cupones para medicamentos de health and safety inspector. Los precios aqu no tienen en cuenta lo que podra costar con la ayuda del seguro (puede ser ms barato con su seguro), pero el sitio web puede darle el precio si no utiliz tourist information centre manager.  - Puede imprimir el cupn correspondiente y llevarlo con su receta a la farmacia.  - Tambin puede pasar por nuestra oficina durante el horario de atencin regular y education officer, museum una tarjeta de cupones de GoodRx.  - Si necesita que su receta se enve electrnicamente a una farmacia diferente, informe a nuestra oficina a travs de MyChart de [place] o por telfono llamando al [phone] y presione la opcin 4.

## 2024-04-10 ENCOUNTER — Encounter: Payer: Self-pay | Admitting: Dermatology

## 2024-05-05 ENCOUNTER — Encounter: Payer: Self-pay | Admitting: Neurology

## 2024-05-05 ENCOUNTER — Ambulatory Visit: Admitting: Neurology

## 2024-05-05 VITALS — BP 112/75 | HR 84 | Ht 63.0 in | Wt 212.6 lb

## 2024-05-05 DIAGNOSIS — G43609 Persistent migraine aura with cerebral infarction, not intractable, without status migrainosus: Secondary | ICD-10-CM

## 2024-05-05 DIAGNOSIS — I639 Cerebral infarction, unspecified: Secondary | ICD-10-CM

## 2024-05-05 NOTE — Progress Notes (Signed)
 "  NEUROLOGY CONSULTATION NOTE  Angela Mccormick MRN: [id number] DOB: [birth year]  Referring provider: Camellia Her, MD Primary care provider: Leron Glance, NP  Reason for consult:  migraines  Assessment/Plan:   Migraine without aura, without status migrainosus, not intractable  Preventative medication not indicated. They are reasonably well managed.  I do not think altering current medications are necessary.  Prescriptions for acute therapy (naproxen and metoclopramide) can continue to be prescribed by PCP.  Advised to treat at earliest onset. Limit use of pain relievers to no more than 9 days out of the month to prevent risk of rebound or medication-overuse headache. Keep headache diary Lifestyle modification:  diet, exercise, sleep hygiene Follow up as needed.   Subjective:  Angela Mccormick is a 38 year old female with history of Chiari malformation who presents for migraines.  History supplemented by prior neurologist's and referring provider's note.  MRI of brain from 2022 personally reviewed.   Onset:  teenager Location:  bilateral frontal Quality:  pressure to pounding Intensity:  8/10.  Aura:  absent Prodrome:  absent Associated symptoms:  Nausea, photophobia, photophobia, osmophobia.  She denies associated vomiting, visual disturbance, autonomic symptoms, unilateral numbness or weakness. Duration:  2 hours (10-15 minutes with naproxen and metoclopramide).  Often may wake up and later go to bed with them. Frequency:  2 days a month Frequency of abortive medication: 2 times a month Triggers:  unknown Relieving factors:  unknown Activity:  aggravates  10/04/2012 MRI BRAIN W WO:  Non-specific focus of enhancement adjacent to the right basal ganglia. Chiari I malformation with 6.5 mm downward displacement of the cerebellar tonsils.  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  Otherwise  unremarkable.    Past NSAIDS/analgesics:  ibuprofen , Excedrin, tramadol, meloxicam, etodolac Past abortive triptans:  sumatriptan (adverse reaction - made her head feel funny) Past abortive ergotamine:  none Past muscle relaxants:  tizanidine Past anti-emetic:  none Past antihypertensive medications:  none Past antidepressant medications:  sertraline , citalopram Past anticonvulsant medications:  topiramate 100mg  at bedtime Past anti-CGRP:  none Past vitamins/Herbal/Supplements:  magnesium Past antihistamines/decongestants:  meclizine , Flonase  Other past therapies:  none  Current NSAIDS/analgesics:  naproxen Current triptans:  none Current ergotamine:  none Current anti-emetic:  metoclopramide 10mg  Current muscle relaxants:  none Current Antihypertensive medications:  none Current Antidepressant medications:  Wellbutrin  XL 300mg  Current Anticonvulsant medications:  none Current anti-CGRP:  none Current Vitamins/Herbal/Supplements:  none Current Antihistamines/Decongestants:  none Other therapy:  none Birth control:  none   Caffeine:  Pepsi daily.  No coffee Alcohol:  no Smoker:  no Diet:  does not drink water.  Skips breakfast.  Fast food Exercise:  walks at work Depression:  yes; Anxiety:  no Sleep hygiene:  uncertain.  Maybe gets 5 hours of sleep a night.  Does not feel rested.  History of TBI/concussion:  no Family history of headache:  maternal aunt Family history of cerebral aneurysm:  no Other family history:  father (brain cancer), maternal aunt (brain tumor)   PAST MEDICAL HISTORY: Past Medical History:  Diagnosis Date   Asthma    Depression    Gestational diabetes    History of Chiari malformation    History of syncope    Migraine     PAST SURGICAL HISTORY: Past Surgical History:  Procedure Laterality Date   CESAREAN SECTION N/A 12/17/2017   Procedure: CESAREAN SECTION;  Surgeon: Lake Read, MD;  Location: [facility]  ORS;  Service: Obstetrics;   Laterality: N/A;   CESAREAN SECTION WITH BILATERAL TUBAL LIGATION  04/06/2019   Procedure: CESAREAN SECTION WITH BILATERAL TUBAL LIGATION;  Surgeon: Lake Read, MD;  Location: [facility] ORS;  Service: Obstetrics;;   FLEXIBLE SIGMOIDOSCOPY N/A 03/12/2018   Procedure: FLEXIBLE SIGMOIDOSCOPY;  Surgeon: Unk Corinn Skiff, MD;  Location: [facility] ENDOSCOPY;  Service: Gastroenterology;  Laterality: N/A;   NO PAST SURGERIES     WISDOM TOOTH EXTRACTION      MEDICATIONS: Medications Ordered Prior to Encounter[1]  ALLERGIES: Allergies[2]  FAMILY HISTORY: Family History  Problem Relation Age of Onset   Diabetes Mother    Stroke Mother    Cancer Mother        possible colon cancer   Diabetes Father    Brain cancer Father 53   Stroke Maternal Aunt    Brain cancer Maternal Aunt        x2; d. 65s   Colon cancer Maternal Grandmother        dx 52s   Stroke Maternal Grandfather    Heart disease Maternal Grandfather        CABG   Prostate cancer Maternal Grandfather        73s   Cancer Paternal Grandmother        bone cancer, d. 14s   Breast cancer Other    Breast cancer Paternal Great-grandmother        both PGGMs   Chiari malformation Son     Objective:  Blood pressure 112/75, pulse 84, height 5' 3 (1.6 m), weight 212 lb 9.6 oz (96.4 kg), SpO2 97%. General: No acute distress.  Patient appears well-groomed.   Head:  Normocephalic/atraumatic Eyes:  fundi examined but not visualized Neck: supple, no paraspinal tenderness, full range of motion Heart: regular rate and rhythm Neurological Exam: Mental status: alert and oriented to person, place, and time, speech fluent and not dysarthric, language intact. Cranial nerves: CN I: not tested CN II: pupils equal, round and reactive to light, visual fields intact CN III, IV, VI:  full range of motion, no nystagmus, no ptosis CN V: facial sensation intact. CN VII: upper and lower face symmetric CN VIII: hearing intact CN IX, X: gag  intact, uvula midline CN XI: sternocleidomastoid and trapezius muscles intact CN XII: tongue midline Bulk & Tone: normal, no fasciculations. Motor:  muscle strength 5/5 throughout Sensation:  Pinprick and vibratory sensation intact. Deep Tendon Reflexes:  2+ throughout,  toes downgoing.   Finger to nose testing:  Without dysmetria.   Gait:  Normal station and stride.  Romberg negative.    Thank you for allowing me to take part in the care of this patient.  Juliene Dunnings, DO  CC:  Leron Glance, NP  Camellia Her, MD        [1]  Current Outpatient Medications on File Prior to Visit  Medication Sig Dispense Refill   albuterol  (VENTOLIN  HFA) 108 (90 Base) MCG/ACT inhaler Inhale 1-2 puffs into the lungs every 6 (six) hours as needed. 18 g 0   amoxicillin -clavulanate (AUGMENTIN ) 875-125 MG tablet Take 1 tablet by mouth 2 (two) times daily. 14 tablet 0   buPROPion  (WELLBUTRIN  XL) 300 MG 24 hr tablet Take 1 tablet (300 mg total) by mouth daily after lunch. 90 tablet 3   clobetasol  cream (TEMOVATE ) 0.05 % Apply 1 Application topically 2 (two) times daily. Apply 0.5 grams twice a day to affected areas of rash on breast until smooth, then stop. Avoid applying to face,  groin, and axilla. Use as directed. Long-term use can cause thinning of the skin. Use 1 gram daily 30 g 0   hydrocortisone  2.5 % cream Apply once or twice daily to affected areas of rash on face as needed only 30 g 2   ketoconazole  (NIZORAL ) 2 % cream Apply once or twice daily to affected areas on face. 30 g 3   nystatin  (MYCOSTATIN /NYSTOP ) powder Apply 1 Application topically 3 (three) times daily. 60 g 2   phentermine (ADIPEX-P) 37.5 MG tablet Take by mouth.     testosterone  cypionate (DEPOTESTOSTERONE CYPIONATE) 200 MG/ML injection Inject into the muscle.     testosterone  cypionate (DEPOTESTOSTERONE CYPIONATE) 200 MG/ML injection Bring to office monthly for IM injection. 1 mL 0   No current facility-administered medications  on file prior to visit.  [2]  Allergies Allergen Reactions   Sumatriptan Succinate Anxiety   Other     dissolvable staples - body rejects   "

## 2024-05-05 NOTE — Patient Instructions (Signed)
 Angela Mccormick

## 2024-05-13 ENCOUNTER — Other Ambulatory Visit: Payer: Self-pay

## 2024-06-04 ENCOUNTER — Other Ambulatory Visit: Payer: Self-pay

## 2024-06-04 ENCOUNTER — Encounter: Payer: Self-pay | Admitting: Emergency Medicine

## 2024-06-04 ENCOUNTER — Ambulatory Visit
Admission: EM | Admit: 2024-06-04 | Discharge: 2024-06-04 | Disposition: A | Discharge status: Home / Self Care | Attending: Emergency Medicine | Admitting: Emergency Medicine

## 2024-06-04 DIAGNOSIS — J0100 Acute maxillary sinusitis, unspecified: Secondary | ICD-10-CM

## 2024-06-04 DIAGNOSIS — J029 Acute pharyngitis, unspecified: Secondary | ICD-10-CM

## 2024-06-04 LAB — POCT RAPID STREP A (OFFICE): Rapid Strep A Screen: NEGATIVE

## 2024-06-04 MED ORDER — AMOXICILLIN-POT CLAVULANATE 875-125 MG PO TABS
1.0000 | ORAL_TABLET | Freq: Two times a day (BID) | ORAL | 0 refills | Status: AC
  Filled 2024-06-04: qty 14, 7d supply, fill #0

## 2024-06-04 MED ORDER — PREDNISONE 10 MG (21) PO TBPK
ORAL_TABLET | Freq: Every day | ORAL | 0 refills | Status: AC
  Filled 2024-06-04: qty 21, 6d supply, fill #0

## 2024-06-04 NOTE — ED Triage Notes (Signed)
 Patient complains of sore throat, cough with yellowish -green and bilateral ear fullness x 1 week. Patient mucinex with no relief. Rates sore throat 7/10.

## 2024-06-04 NOTE — Discharge Instructions (Signed)
 Today you are being treated for a sinus infection  begin Augmentin  twice daily for 7 days  Begin prednisone  every morning with food as directed to help reduce sinus pressure  You can take Tylenol  and/or Ibuprofen  as needed for fever reduction and pain relief.   For cough: honey 1/2 to 1 teaspoon (you can dilute the honey in water or another fluid).  You can also use guaifenesin and dextromethorphan for cough. You can use a humidifier for chest congestion and cough.  If you don't have a humidifier, you can sit in the bathroom with the hot shower running.      For sore throat: try warm salt water gargles, cepacol lozenges, throat spray, warm tea or water with lemon/honey, popsicles or ice, or OTC cold relief medicine for throat discomfort.   For congestion: take a daily anti-histamine like Zyrtec, Claritin, and a oral decongestant, such as pseudoephedrine.  You can also use Flonase  1-2 sprays in each nostril daily.   It is important to stay hydrated: drink plenty of fluids (water, gatorade/powerade/pedialyte, juices, or teas) to keep your throat moisturized and help further relieve irritation/discomfort.

## 2024-06-04 NOTE — ED Provider Notes (Signed)
 " [facility]    CSN: [id number] Arrival date & time: 06/04/24  1051      History   Chief Complaint Chief Complaint  Patient presents with   Sore Throat   Cough   Ear Fullness    HPI Angela Mccormick is a 39 y.o. female.   Patient dents for evaluation of nasal congestion, bilateral ear fullness, sinus pressure, productive cough present for 7 days.  Began to experience sore throat today.  Has been tolerable to food and liquids.  No known sick contacts.  Has attempted use of Mucinex and an inhaler.  History of asthma, denying shortness of breath or wheezing.  Non-smoker.  Past Medical History:  Diagnosis Date   Asthma    Depression    Gestational diabetes    History of Chiari malformation    History of syncope    Migraine     Patient Active Problem List   Diagnosis Date Noted   Viral URI 07/04/2023   Subcutaneous cyst 02/06/2023   Genetic testing 03/07/2022   Family history of cancer 02/08/2022   Ear itching 09/27/2021   Urine frequency 04/05/2021   Right knee pain 01/10/2021   Low libido 01/10/2021   Hypersomnia 10/08/2020   Abnormal menses 07/01/2020   Congestion of nasal sinus 04/19/2020   Rash 04/19/2020   Routine general medical examination at a health care facility 03/05/2020   Anxiety and depression 03/05/2020   Obesity (BMI 30.0-34.9) 03/05/2020   Asthma 12/08/2019   Dizziness 09/08/2019   Rectal bleeding 03/04/2018   Nodule of neck 11/01/2016   Chiari malformation (HCC) 09/26/2012   Migraines 09/26/2012    Past Surgical History:  Procedure Laterality Date   CESAREAN SECTION N/A 12/17/2017   Procedure: CESAREAN SECTION;  Surgeon: Lake Read, MD;  Location: [facility] ORS;  Service: Obstetrics;  Laterality: N/A;   CESAREAN SECTION WITH BILATERAL TUBAL LIGATION  04/06/2019   Procedure: CESAREAN SECTION WITH BILATERAL TUBAL LIGATION;  Surgeon: Lake Read, MD;  Location: [facility] ORS;  Service: Obstetrics;;   FLEXIBLE SIGMOIDOSCOPY N/A  03/12/2018   Procedure: FLEXIBLE SIGMOIDOSCOPY;  Surgeon: Unk Corinn Skiff, MD;  Location: [facility] ENDOSCOPY;  Service: Gastroenterology;  Laterality: N/A;   NO PAST SURGERIES     WISDOM TOOTH EXTRACTION      OB History     Gravida  2   Para  2   Term  0   Preterm  2   AB      Living  2      SAB      IAB      Ectopic      Multiple  0   Live Births  2            Home Medications    Prior to Admission medications  Medication Sig Start Date End Date Taking? Authorizing Provider  albuterol  (VENTOLIN  HFA) 108 (90 Base) MCG/ACT inhaler Inhale 1-2 puffs into the lungs every 6 (six) hours as needed. 07/06/23   Corlis Burnard DEL, NP  buPROPion  (WELLBUTRIN  XL) 300 MG 24 hr tablet Take 1 tablet (300 mg total) by mouth daily after lunch. Patient not taking: Reported on 05/05/2024 11/20/23   Gretel App, NP  clobetasol  cream (TEMOVATE ) 0.05 % Apply 1 Application topically 2 (two) times daily. Apply 0.5 grams twice a day to affected areas of rash on breast until smooth, then stop. Avoid applying to face, groin, and axilla. Use as directed. Long-term use can cause thinning of the skin. Use  1 gram daily 04/04/23   Claudene Lehmann, MD  hydrocortisone  2.5 % cream Apply once or twice daily to affected areas of rash on face as needed only 04/08/24   Claudene Lehmann, MD  ketoconazole  (NIZORAL ) 2 % cream Apply once or twice daily to affected areas on face. 04/08/24   Smith, Collin-Jamal, MD  metoCLOPramide (REGLAN) 5 MG tablet Take 5 mg by mouth as needed. 03/13/23   [provider]  naproxen (NAPROSYN) 500 MG tablet Take 500 mg by mouth as needed. 03/13/23   [provider]  nystatin  (MYCOSTATIN /NYSTOP ) powder Apply 1 Application topically 3 (three) times daily. 11/20/23   Gretel App, NP  phentermine (ADIPEX-P) 37.5 MG tablet Take by mouth. 06/19/23 11/20/23  [provider]  topiramate (TOPAMAX) 50 MG tablet Take 100 mg by mouth at bedtime. Patient not  taking: Reported on 05/05/2024 11/18/23   [provider]    Family History Family History  Problem Relation Age of Onset   Diabetes Mother    Stroke Mother    Cancer Mother        possible colon cancer   Diabetes Father    Brain cancer Father 78   Seizures Maternal Aunt    Migraines Maternal Aunt    Stroke Maternal Aunt    Brain cancer Maternal Aunt        x2; d. 76s   Colon cancer Maternal Grandmother        dx 33s   Parkinsonism Maternal Grandfather    Stroke Maternal Grandfather    Heart disease Maternal Grandfather        CABG   Prostate cancer Maternal Grandfather        29s   Cancer Paternal Grandmother        bone cancer, d. 27s   Chiari malformation Son    Breast cancer Paternal Great-grandmother        both PGGMs   Breast cancer Other     Social History Social History[1]   Allergies   Sumatriptan succinate and Other   Review of Systems Review of Systems   Physical Exam Triage Vital Signs ED Triage Vitals  Encounter Vitals Group     BP 06/04/24 1159 118/78     Girls Systolic BP Percentile --      Girls Diastolic BP Percentile --      Boys Systolic BP Percentile --      Boys Diastolic BP Percentile --      Pulse Rate 06/04/24 1159 75     Resp 06/04/24 1159 18     Temp 06/04/24 1159 98.2 F (36.8 C)     Temp Source 06/04/24 1159 Oral     SpO2 06/04/24 1159 95 %     Weight --      Height --      Head Circumference --      Peak Flow --      Pain Score 06/04/24 1201 7     Pain Loc --      Pain Education --      Exclude from Growth Chart --    No data found.  Updated Vital Signs BP 118/78 (BP Location: Right Arm)   Pulse 75   Temp 98.2 F (36.8 C) (Oral)   Resp 18   LMP 06/03/2024 (Exact Date)   SpO2 95%   Visual Acuity Right Eye Distance:   Left Eye Distance:   Bilateral Distance:    Right Eye Near:   Left Eye Near:  Bilateral Near:     Physical Exam Constitutional:      Appearance: Normal appearance.  HENT:      Right Ear: Tympanic membrane, ear canal and external ear normal.     Left Ear: Tympanic membrane, ear canal and external ear normal.     Nose: Congestion present.     Right Sinus: Maxillary sinus tenderness present. No frontal sinus tenderness.     Left Sinus: Maxillary sinus tenderness present. No frontal sinus tenderness.     Mouth/Throat:     Pharynx: No oropharyngeal exudate or posterior oropharyngeal erythema.  Eyes:     Extraocular Movements: Extraocular movements intact.  Cardiovascular:     Rate and Rhythm: Normal rate and regular rhythm.     Pulses: Normal pulses.     Heart sounds: Normal heart sounds.  Pulmonary:     Effort: Pulmonary effort is normal.     Breath sounds: Normal breath sounds.  Musculoskeletal:     Cervical back: Normal range of motion and neck supple.  Lymphadenopathy:     Cervical: Cervical adenopathy present.  Neurological:     Mental Status: She is alert and oriented to person, place, and time. Mental status is at baseline.      UC Treatments / Results  Labs (all labs ordered are listed, but only abnormal results are displayed) Labs Reviewed  POCT RAPID STREP A (OFFICE) - Normal    EKG   Radiology No results found.  Procedures Procedures (including critical care time)  Medications Ordered in UC Medications - No data to display  Initial Impression / Assessment and Plan / UC Course  I have reviewed the triage vital signs and the nursing notes.  Pertinent labs & imaging results that were available during my care of the patient were reviewed by me and considered in my medical decision making (see chart for details).  Acute nonrecurrent maxillary sinusitis, sore throat  Patient is in no signs of distress nor toxic appearing.  Vital signs are stable.  Low suspicion for pneumonia, pneumothorax or bronchitis and therefore will defer imaging. Strep negative.  Presentation consistent with a sinusitis symptoms present for 7 days, prescribed  Augmentin  and prednisone . May use additional over-the-counter medications as needed for supportive care.  May follow-up with urgent care as needed if symptoms persist or worsen.  Note given.   Final Clinical Impressions(s) / UC Diagnoses   Final diagnoses:  Sore throat   Discharge Instructions   None    ED Prescriptions   None    PDMP not reviewed this encounter.     [1]  Social History Tobacco Use   Smoking status: Never   Smokeless tobacco: Never  Vaping Use   Vaping status: Never Used  Substance Use Topics   Alcohol use: Not Currently    Comment: 1 drink/year   Drug use: No     Teresa Shelba SAUNDERS, NP 06/04/24 1257  "

## 2024-11-20 ENCOUNTER — Encounter: Admitting: Nurse Practitioner
